# Patient Record
Sex: Male | Born: 1960 | Race: White | Hispanic: No | Marital: Married | Smoking: Former smoker
Health system: Southern US, Community
[De-identification: ages and names within clinical notes are randomized; demographics above are authoritative.]

## PROBLEM LIST (undated history)

## (undated) DIAGNOSIS — K219 Gastro-esophageal reflux disease without esophagitis: Secondary | ICD-10-CM

## (undated) DIAGNOSIS — F419 Anxiety disorder, unspecified: Secondary | ICD-10-CM

## (undated) DIAGNOSIS — N5089 Other specified disorders of the male genital organs: Secondary | ICD-10-CM

## (undated) DIAGNOSIS — R002 Palpitations: Secondary | ICD-10-CM

## (undated) DIAGNOSIS — N400 Enlarged prostate without lower urinary tract symptoms: Secondary | ICD-10-CM

## (undated) DIAGNOSIS — J45909 Unspecified asthma, uncomplicated: Secondary | ICD-10-CM

## (undated) DIAGNOSIS — I10 Essential (primary) hypertension: Secondary | ICD-10-CM

## (undated) DIAGNOSIS — G4700 Insomnia, unspecified: Secondary | ICD-10-CM

## (undated) DIAGNOSIS — K259 Gastric ulcer, unspecified as acute or chronic, without hemorrhage or perforation: Secondary | ICD-10-CM

## (undated) HISTORY — DX: Palpitations: R00.2

## (undated) HISTORY — DX: Anxiety disorder, unspecified: F41.9

## (undated) HISTORY — DX: Unspecified asthma, uncomplicated: J45.909

## (undated) HISTORY — DX: Benign prostatic hyperplasia without lower urinary tract symptoms: N40.0

## (undated) HISTORY — DX: Gastric ulcer, unspecified as acute or chronic, without hemorrhage or perforation: K25.9

## (undated) HISTORY — DX: Essential (primary) hypertension: I10

## (undated) HISTORY — PX: MASTECTOMY: SHX3

## (undated) HISTORY — DX: Other specified disorders of the male genital organs: N50.89

## (undated) HISTORY — DX: Gastro-esophageal reflux disease without esophagitis: K21.9

## (undated) HISTORY — DX: Insomnia, unspecified: G47.00

---

## 1981-12-07 HISTORY — PX: VASECTOMY: SHX75

## 2005-10-26 ENCOUNTER — Ambulatory Visit: Payer: Self-pay | Admitting: Specialist

## 2005-12-09 ENCOUNTER — Emergency Department: Payer: Self-pay | Admitting: Emergency Medicine

## 2006-01-05 ENCOUNTER — Ambulatory Visit: Payer: Self-pay | Admitting: Psychiatry

## 2006-12-07 HISTORY — PX: INGUINAL HERNIA REPAIR: SUR1180

## 2007-01-03 DIAGNOSIS — F419 Anxiety disorder, unspecified: Secondary | ICD-10-CM | POA: Insufficient documentation

## 2007-04-04 DIAGNOSIS — R002 Palpitations: Secondary | ICD-10-CM | POA: Insufficient documentation

## 2007-07-26 DIAGNOSIS — K219 Gastro-esophageal reflux disease without esophagitis: Secondary | ICD-10-CM | POA: Insufficient documentation

## 2007-12-06 DIAGNOSIS — G4700 Insomnia, unspecified: Secondary | ICD-10-CM | POA: Insufficient documentation

## 2007-12-30 ENCOUNTER — Ambulatory Visit: Payer: Self-pay | Admitting: General Surgery

## 2010-05-27 ENCOUNTER — Ambulatory Visit: Payer: Self-pay | Admitting: Family Medicine

## 2010-05-27 DIAGNOSIS — J45901 Unspecified asthma with (acute) exacerbation: Secondary | ICD-10-CM | POA: Insufficient documentation

## 2014-05-25 DIAGNOSIS — N5089 Other specified disorders of the male genital organs: Secondary | ICD-10-CM | POA: Insufficient documentation

## 2014-05-30 ENCOUNTER — Ambulatory Visit: Payer: Self-pay | Admitting: Family Medicine

## 2015-01-25 ENCOUNTER — Ambulatory Visit: Payer: Self-pay | Admitting: Family Medicine

## 2015-06-27 ENCOUNTER — Ambulatory Visit: Payer: Self-pay | Admitting: Urology

## 2015-07-02 ENCOUNTER — Ambulatory Visit: Payer: Self-pay | Admitting: Urology

## 2015-07-17 ENCOUNTER — Encounter: Payer: Self-pay | Admitting: *Deleted

## 2015-07-19 ENCOUNTER — Encounter: Payer: Self-pay | Admitting: Family Medicine

## 2015-07-19 ENCOUNTER — Ambulatory Visit (INDEPENDENT_AMBULATORY_CARE_PROVIDER_SITE_OTHER): Payer: BLUE CROSS/BLUE SHIELD | Admitting: Family Medicine

## 2015-07-19 VITALS — BP 128/72 | HR 58 | Temp 98.2°F | Resp 16 | Ht 66.0 in | Wt 154.0 lb

## 2015-07-19 DIAGNOSIS — B349 Viral infection, unspecified: Secondary | ICD-10-CM | POA: Diagnosis not present

## 2015-07-19 DIAGNOSIS — R0681 Apnea, not elsewhere classified: Secondary | ICD-10-CM | POA: Insufficient documentation

## 2015-07-19 MED ORDER — HYDROCODONE-HOMATROPINE 5-1.5 MG/5ML PO SYRP: ORAL_SOLUTION | ORAL | Status: DC

## 2015-07-19 MED ORDER — DOXYCYCLINE HYCLATE 100 MG PO TABS: 100.0000 mg | ORAL_TABLET | Freq: Two times a day (BID) | ORAL | Status: DC

## 2015-07-19 NOTE — Progress Notes (Signed)
Subjective:     Patient ID: Benjamin Ford, male   DOB: 1961-04-09, 54 y.o.   MRN: [id number]  HPI  Chief Complaint  Patient presents with  . Cough    Patient comes in office today with concerns of cough and congestion for the past 4 days. Patient states he had a fever high of 101.2, associated symptoms include: runny nose, ear ache and sinus pain/pressure. Patient states that he has taken otc Tylenol and Mucinex  Also reports mild body aches and headache. Reports possibility of tick exposure though has not removed any ticks.   Review of Systems  Respiratory:       Using albuterol MDI for chest tightness once daily.       Objective:   Physical Exam  Constitutional: He appears well-developed and well-nourished. No distress.  Ears: T.M's intact without inflammation Throat: no tonsillar enlargement or exudate Neck: no cervical adenopathy Lungs: clear     Assessment:    1. Viral syndrome - HYDROcodone-homatropine (HYCODAN) 5-1.5 MG/5ML syrup; 5 ml 4-6 hours as needed for cough  Dispense: 240 mL; Refill: 0 - doxycycline (VIBRA-TABS) 100 MG tablet; Take 1 tablet (100 mg total) by mouth 2 (two) times daily.  Dispense: 20 tablet; Refill: 0    Plan:    Continue to treat symptomatically. If fever, body aches and fever do not resolve over the next 2-3 days to start antibiotic.

## 2015-07-19 NOTE — Patient Instructions (Signed)
Start antibiotic if fever not abating; symptoms improving over the next 2-3 days. Add Mucinex D.

## 2015-07-23 ENCOUNTER — Encounter: Payer: Self-pay | Admitting: Urology

## 2015-07-23 ENCOUNTER — Ambulatory Visit (INDEPENDENT_AMBULATORY_CARE_PROVIDER_SITE_OTHER): Payer: BLUE CROSS/BLUE SHIELD | Admitting: Urology

## 2015-07-23 VITALS — Resp 18 | Ht 66.75 in | Wt 153.3 lb

## 2015-07-23 DIAGNOSIS — N401 Enlarged prostate with lower urinary tract symptoms: Secondary | ICD-10-CM | POA: Diagnosis not present

## 2015-07-23 DIAGNOSIS — Z8042 Family history of malignant neoplasm of prostate: Secondary | ICD-10-CM | POA: Diagnosis not present

## 2015-07-23 DIAGNOSIS — N138 Other obstructive and reflux uropathy: Secondary | ICD-10-CM | POA: Insufficient documentation

## 2015-07-23 LAB — BLADDER SCAN AMB NON-IMAGING

## 2015-07-23 NOTE — Progress Notes (Signed)
07/23/2015 2:34 PM   Benjamin Ford 1961-06-26 [id number]  Referring provider: No referring provider defined for this encounter.  Chief Complaint  Patient presents with  . Follow-up    6 month  . Benign Prostatic Hypertrophy  . Spermatocele    HPI: Mr. Kunath is a 54 year old white male with a strong family history of PCa, with his brother and father both being diagnosed with PCa, and BPH with LUTS who presents today for a 6 month follow up.    His father was diagnosed at the age of 21 with PCa and his brother at the age of 60.  His brother recently underwent a prostatectomy.    His IPSS score today is 4 , which is mild lower urinary tract symptomatology. He is pleased with his quality life due to his urinary symptoms. His PVR is 20 mL.  His previous IPSS score was 1/0.    His major complaint today urinary frequency, but he contributes that to drinking large amounts of water and green tea..  He has had these symptoms for 2 years.  He denies any dysuria, hematuria or suprapubic pain.   Previous PSA's:     0.6 ng/mL on 06/26/2014     0.7 ng/mL on 12/27/2014  He also denies any recent chills, nausea or vomiting.  He has been experiencing a low-grade fever for the last several months. His primary care provider believes this may be a symptom of tick borne fever. He is currently on a regimen of doxycycline for this condition.      IPSS      07/23/15 1400       International Prostate Symptom Score   How often have you had the sensation of not emptying your bladder? Not at All     How often have you had to urinate less than every two hours? About half the time     How often have you found you stopped and started again several times when you urinated? Not at All     How often have you found it difficult to postpone urination? Not at All     How often have you had a weak urinary stream? Not at All     How often have you had to strain to start urination? Not at All     How many  times did you typically get up at night to urinate? 1 Time     Total IPSS Score 4     Quality of Life due to urinary symptoms   If you were to spend the rest of your life with your urinary condition just the way it is now how would you feel about that? Pleased        Score:  1-7 Mild 8-19 Moderate 20-35 Severe     PMH: Past Medical History  Diagnosis Date  . Testicular mass   . Asthma   . Insomnia   . GERD (gastroesophageal reflux disease)   . Palpitations   . Anxiety   . Stomach ulcer   . BPH (benign prostatic hyperplasia)   . HTN (hypertension)     Surgical History: Past Surgical History  Procedure Laterality Date  . Mastectomy Bilateral 1992/1981  . Vasectomy  1983  . Inguinal hernia repair  2008    Home Medications:    Medication List       This list is accurate as of: 07/23/15  2:34 PM.  Always use your most recent med list.  doxycycline 100 MG tablet  Commonly known as:  VIBRA-TABS  Take 1 tablet (100 mg total) by mouth 2 (two) times daily.     guaiFENesin 600 MG 12 hr tablet  Commonly known as:  MUCINEX  Take 600 mg by mouth 2 (two) times daily.     HYDROcodone-homatropine 5-1.5 MG/5ML syrup  Commonly known as:  HYCODAN  5 ml 4-6 hours as needed for cough     PROAIR HFA 108 (90 BASE) MCG/ACT inhaler  Generic drug:  albuterol  Inhale into the lungs.        Allergies: No Known Allergies  Family History: Family History  Problem Relation Age of Onset  . Prostate cancer Father   . Cancer Father     bone, prostate  . Prostate cancer Brother   . Kidney disease Neg Hx   . Bladder Cancer Neg Hx   . COPD Mother   . Cancer Mother     lung  . Cancer Maternal Grandmother     brain  . Aneurysm Paternal Grandmother     Social History:  reports that he has quit smoking. He does not have any smokeless tobacco history on file. He reports that he drinks alcohol. His drug history is not on file.  ROS: UROLOGY Frequent  Urination?: No Hard to postpone urination?: No Burning/pain with urination?: No Get up at night to urinate?: Yes Leakage of urine?: No Urine stream starts and stops?: No Trouble starting stream?: No Do you have to strain to urinate?: No Blood in urine?: No Urinary tract infection?: No Sexually transmitted disease?: No Injury to kidneys or bladder?: No Painful intercourse?: No Weak stream?: No Erection problems?: No Penile pain?: No  Gastrointestinal Nausea?: No Vomiting?: No Indigestion/heartburn?: No Diarrhea?: No Constipation?: No  Constitutional Fever: Yes Night sweats?: No Weight loss?: No Fatigue?: No  Skin Skin rash/lesions?: No Itching?: No  Eyes Blurred vision?: No Double vision?: No  Ears/Nose/Throat Sore throat?: No Sinus problems?: Yes  Hematologic/Lymphatic Swollen glands?: No Easy bruising?: No  Cardiovascular Leg swelling?: No Chest pain?: No  Respiratory Cough?: Yes Shortness of breath?: No  Endocrine Excessive thirst?: No  Musculoskeletal Back pain?: No Joint pain?: Yes  Neurological Headaches?: No Dizziness?: No  Psychologic Depression?: No Anxiety?: No  Physical Exam: Resp 18  Ht 5' 6.75" (1.695 m)  Wt 153 lb 4.8 oz (69.536 kg)  BMI 24.20 kg/m2  GU: Patient with circumcised phallus.   Urethral meatus is patent.  No penile discharge. No penile lesions or rashes. Scrotum without lesions, cysts, rashes and/or edema.  Testicles are located scrotally bilaterally. No masses are appreciated in the testicles. Left and right epididymis are normal. Right spermatocele ( 3 cm x 3cm) is noted. Rectal: Patient with  normal sphincter tone. Perineum without scarring or rashes. No rectal masses are appreciated. Prostate is approximately 45 grams, no nodules are appreciated. Seminal vesicles are normal.  Laboratory Data: No results found for: WBC, HGB, HCT, MCV, PLT  No results found for: CREATININE  No results found for: PSA  No  results found for: TESTOSTERONE  No results found for: HGBA1C  Urinalysis No results found for: COLORURINE, APPEARANCEUR, LABSPEC, PHURINE, GLUCOSEU, HGBUR, BILIRUBINUR, KETONESUR, PROTEINUR, UROBILINOGEN, NITRITE, LEUKOCYTESUR  Pertinent Imaging: Results for orders placed or performed in visit on 07/23/15  BLADDER SCAN AMB NON-IMAGING  Result Value Ref Range   Scan Result 20ml     Assessment & Plan  1. BPH with LUTS:   Patient's IPSS score is 4/1.  His  PVR 20 mL.  His DRE demonstrates mild enlargement. We will continue observation.   He will follow up in 6 months for a PSA, DRE, PVR and an IPSS.  PSA to be drawn before appointment  - PSA - BLADDER SCAN AMB NON-IMAGING  2. Family history of prostate cancer:   Patient's father and brother were both diagnosed with prostate cancer in their 1s.  We will continue to monitor every 6 months with PSA and DRE.   No Follow-up on file.  Piera Downs, PA-C

## 2015-07-24 ENCOUNTER — Telehealth: Payer: Self-pay

## 2015-07-24 LAB — PSA: PROSTATE SPECIFIC AG, SERUM: 0.7 ng/mL (ref 0.0–4.0)

## 2015-07-24 NOTE — Telephone Encounter (Signed)
Spoke with pt in reference to PSA results. Pt voiced understanding.  

## 2015-07-24 NOTE — Telephone Encounter (Signed)
-----   Message from Harle Battiest, PA-C sent at 07/24/2015  8:08 AM EDT ----- PSA is stable we will see him in 6 months.

## 2016-01-15 ENCOUNTER — Other Ambulatory Visit: Payer: Self-pay

## 2016-01-15 DIAGNOSIS — R972 Elevated prostate specific antigen [PSA]: Secondary | ICD-10-CM

## 2016-01-16 ENCOUNTER — Other Ambulatory Visit: Payer: BLUE CROSS/BLUE SHIELD

## 2016-01-16 DIAGNOSIS — R972 Elevated prostate specific antigen [PSA]: Secondary | ICD-10-CM

## 2016-01-17 LAB — PSA: PROSTATE SPECIFIC AG, SERUM: 0.7 ng/mL (ref 0.0–4.0)

## 2016-01-23 ENCOUNTER — Ambulatory Visit (INDEPENDENT_AMBULATORY_CARE_PROVIDER_SITE_OTHER): Payer: BLUE CROSS/BLUE SHIELD | Admitting: Urology

## 2016-01-23 ENCOUNTER — Encounter: Payer: Self-pay | Admitting: Urology

## 2016-01-23 VITALS — BP 160/68 | HR 52 | Ht 67.0 in | Wt 156.9 lb

## 2016-01-23 DIAGNOSIS — Z8042 Family history of malignant neoplasm of prostate: Secondary | ICD-10-CM

## 2016-01-23 DIAGNOSIS — N401 Enlarged prostate with lower urinary tract symptoms: Secondary | ICD-10-CM | POA: Diagnosis not present

## 2016-01-23 DIAGNOSIS — N138 Other obstructive and reflux uropathy: Secondary | ICD-10-CM

## 2016-01-23 NOTE — Progress Notes (Signed)
3:20 PM   Benjamin Ford 08/03/1961 [id number]  Referring provider: Malva Limes, MD [address] [place], [place] [address]  Chief Complaint  Patient presents with  . Benign Prostatic Hypertrophy    follow up 6 month     HPI: Benjamin Ford is a 55 year old white male with a strong family history of PCa, with his brother and father both being diagnosed with PCa, and BPH with LUTS who presents today for a 6 month follow up.    His father was diagnosed at the age of 74 with PCa and his brother at the age of 96.  His brother recently underwent a prostatectomy.    His IPSS score today is 1 , which is mild lower urinary tract symptomatology. He is delighted with his quality life due to his urinary symptoms. His PVR is 20 mL.  His previous IPSS score was 4/1.    His major complaint today is nocturia x 1, but this is intermittent and not bothersome.   He has had these symptoms for 2 years.  He denies any dysuria, hematuria or suprapubic pain.   He also denies any recent chills, nausea or vomiting.  He has been experiencing a low-grade fever for the last several months. His primary care provider believes this may be a symptom of tick borne fever. He is currently on a regimen of doxycycline for this condition.      IPSS      01/23/16 1400       International Prostate Symptom Score   How often have you had the sensation of not emptying your bladder? Not at All     How often have you had to urinate less than every two hours? Not at All     How often have you found you stopped and started again several times when you urinated? Not at All     How often have you found it difficult to postpone urination? Not at All     How often have you had a weak urinary stream? Not at All     How often have you had to strain to start urination? Not at All     How many times did you typically get up at night to urinate? 1 Time     Total IPSS Score 1     Quality of Life due to urinary  symptoms   If you were to spend the rest of your life with your urinary condition just the way it is now how would you feel about that? Delighted        Score:  1-7 Mild 8-19 Moderate 20-35 Severe    PMH: Past Medical History  Diagnosis Date  . Testicular mass   . Asthma   . Insomnia   . GERD (gastroesophageal reflux disease)   . Palpitations   . Anxiety   . Stomach ulcer   . BPH (benign prostatic hyperplasia)   . HTN (hypertension)     Surgical History: Past Surgical History  Procedure Laterality Date  . Mastectomy Bilateral 1992/1981  . Vasectomy  1983  . Inguinal hernia repair  2008    Home Medications:    Medication List       This list is accurate as of: 01/23/16  3:20 PM.  Always use your most recent med list.               doxycycline 100 MG tablet  Commonly known as:  VIBRA-TABS  Take 1 tablet (100 mg total) by mouth 2 (two) times daily.     guaiFENesin 600 MG 12 hr tablet  Commonly known as:  MUCINEX  Take 600 mg by mouth 2 (two) times daily.     HYDROcodone-homatropine 5-1.5 MG/5ML syrup  Commonly known as:  HYCODAN  5 ml 4-6 hours as needed for cough     PROAIR HFA 108 (90 Base) MCG/ACT inhaler  Generic drug:  albuterol  Inhale into the lungs.        Allergies: No Known Allergies  Family History: Family History  Problem Relation Age of Onset  . Prostate cancer Father   . Cancer Father     bone, prostate  . Prostate cancer Brother   . Kidney disease Neg Hx   . Bladder Cancer Neg Hx   . COPD Mother   . Cancer Mother     lung  . Cancer Maternal Grandmother     brain  . Aneurysm Paternal Grandmother     Social History:  reports that he has quit smoking. He does not have any smokeless tobacco history on file. He reports that he drinks alcohol. He reports that he does not use illicit drugs.  ROS: UROLOGY Frequent Urination?: No Hard to postpone urination?: No Burning/pain with urination?: No Get up at night to urinate?:  Yes Leakage of urine?: No Urine stream starts and stops?: No Trouble starting stream?: No Do you have to strain to urinate?: No Blood in urine?: No Urinary tract infection?: No Sexually transmitted disease?: No Injury to kidneys or bladder?: No Painful intercourse?: No Weak stream?: No Erection problems?: No Penile pain?: No  Gastrointestinal Nausea?: No Vomiting?: No Indigestion/heartburn?: No Diarrhea?: No Constipation?: No  Constitutional Fever: No Night sweats?: No Weight loss?: No Fatigue?: No  Skin Skin rash/lesions?: No Itching?: No  Eyes Blurred vision?: No Double vision?: No  Ears/Nose/Throat Sore throat?: No Sinus problems?: Yes  Hematologic/Lymphatic Swollen glands?: No Easy bruising?: No  Cardiovascular Leg swelling?: No Chest pain?: No  Respiratory Cough?: No Shortness of breath?: No  Endocrine Excessive thirst?: No  Musculoskeletal Back pain?: No Joint pain?: No  Neurological Headaches?: No Dizziness?: No  Psychologic Depression?: No Anxiety?: No  Physical Exam: BP 160/68 mmHg  Pulse 52  Ht 5\' 7"  (1.702 m)  Wt 156 lb 14.4 oz (71.169 kg)  BMI 24.57 kg/m2  GU: Patient with circumcised phallus.   Urethral meatus is patent.  No penile discharge. No penile lesions or rashes. Scrotum without lesions, cysts, rashes and/or edema.  Testicles are located scrotally bilaterally. No masses are appreciated in the testicles. Left and right epididymis are normal. Right spermatocele ( 3 cm x 3cm) is noted. Rectal: Patient with  normal sphincter tone. Perineum without scarring or rashes. No rectal masses are appreciated. Prostate is approximately 45 grams, no nodules are appreciated. Seminal vesicles are normal.  Laboratory Data: Previous PSA's:     0.6 ng/mL on 06/26/2014     0.7 ng/mL on 12/27/2014  Results for orders placed or performed in visit on 01/16/16  PSA  Result Value Ref Range   Prostate Specific Ag, Serum 0.7 0.0 - 4.0 ng/mL     Assessment & Plan  1. BPH with LUTS:   Patient's IPSS score is 1/0.   We will continue observation.   He will follow up in 6 months for a PSA, exam and an IPSS score.    2. Family history of prostate cancer:   Patient's father and brother were both  diagnosed with prostate cancer in their 27's.  We will continue to monitor every 6 months with PSA and DRE.   Return in about 6 months (around 07/22/2016) for IPSS score and exam.  Lashell Moffitt, PA-C

## 2016-06-30 ENCOUNTER — Telehealth: Payer: Self-pay

## 2016-06-30 NOTE — Telephone Encounter (Signed)
He should take 50mg  benadryl if he is not going to be driving anywhere this afternoon. He need to apply ice to area of bee sting to reduce spread of venom and inflammation. Go to Er if he breaks out in generalized rash, or has any wheezing or difficulty breathing.

## 2016-06-30 NOTE — Telephone Encounter (Signed)
Advised patient as below.  

## 2016-06-30 NOTE — Telephone Encounter (Signed)
Patient reports that he was stung by a bee a little over 1 hour ago. Patient reports that he was stung on his right leg just above it knee. Patient denies any shortness of breath, wheezing, or stridor. Patient reports that he is starting to have swelling, redness, itching, and a burning sensation in the area where he was stung. Patient wanted to know should he be seen? Or can he just take Benadryl for the symptoms? Please advise. Contact info is correct. Thanks!

## 2016-07-16 ENCOUNTER — Other Ambulatory Visit: Payer: BLUE CROSS/BLUE SHIELD

## 2016-07-17 ENCOUNTER — Other Ambulatory Visit: Payer: Self-pay

## 2016-07-17 DIAGNOSIS — N400 Enlarged prostate without lower urinary tract symptoms: Secondary | ICD-10-CM

## 2016-07-20 ENCOUNTER — Other Ambulatory Visit: Payer: BLUE CROSS/BLUE SHIELD

## 2016-07-20 DIAGNOSIS — N400 Enlarged prostate without lower urinary tract symptoms: Secondary | ICD-10-CM

## 2016-07-21 LAB — PSA: Prostate Specific Ag, Serum: 0.8 ng/mL (ref 0.0–4.0)

## 2016-07-23 ENCOUNTER — Ambulatory Visit (INDEPENDENT_AMBULATORY_CARE_PROVIDER_SITE_OTHER): Payer: BLUE CROSS/BLUE SHIELD | Admitting: Urology

## 2016-07-23 ENCOUNTER — Encounter: Payer: Self-pay | Admitting: Urology

## 2016-07-23 VITALS — BP 150/79 | HR 67 | Ht 66.5 in | Wt 153.4 lb

## 2016-07-23 DIAGNOSIS — N138 Other obstructive and reflux uropathy: Secondary | ICD-10-CM

## 2016-07-23 DIAGNOSIS — Z8042 Family history of malignant neoplasm of prostate: Secondary | ICD-10-CM | POA: Diagnosis not present

## 2016-07-23 DIAGNOSIS — N401 Enlarged prostate with lower urinary tract symptoms: Secondary | ICD-10-CM | POA: Diagnosis not present

## 2016-07-23 NOTE — Progress Notes (Signed)
2:01 PM   Benjamin Ford [birth year] [id number]  Referring provider: Malva Limesonald E Fisher, MD [address] [place], [place] [id number]  Chief Complaint  Patient presents with  . Follow-up    BPH    HPI: [name] is a 55 year old Caucasian male with a strong family history of PCa, with his brother and father both being diagnosed with PCa, and BPH with LUTS who presents today for a 6 month follow up.    His father was diagnosed at the age of 56 with PCa and his brother at the age of 56.  His brother recently underwent a prostatectomy.  His other brother has been followed for a rising PSA.    His IPSS score today is 2 , which is mild lower urinary tract symptomatology. He is delighted with his quality life due to his urinary symptoms. His previous PVR is 20 mL.  His previous IPSS score was 1/0.    His major complaint today is nocturia x 1, but this is intermittent and not bothersome.   He has had these symptoms for 3 years.  He denies any dysuria, hematuria or suprapubic pain.   He also denies any recent chills, nausea or vomiting.         IPSS    Row Name 07/23/16 1300         International Prostate Symptom Score   How often have you had the sensation of not emptying your bladder? Not at All     How often have you had to urinate less than every two hours? Less than 1 in 5 times     How often have you found you stopped and started again several times when you urinated? Not at All     How often have you found it difficult to postpone urination? Not at All     How often have you had a weak urinary stream? Not at All     How often have you had to strain to start urination? Not at All     How many times did you typically get up at night to urinate? 1 Time     Total IPSS Score 2       Quality of Life due to urinary symptoms   If you were to spend the rest of your life with your urinary condition just the way it is now how would you feel about that? Delighted         Score:  1-7 Mild 8-19 Moderate 20-35 Severe    PMH: Past Medical History:  Diagnosis Date  . Anxiety   . Asthma   . BPH (benign prostatic hyperplasia)   . GERD (gastroesophageal reflux disease)   . HTN (hypertension)   . Insomnia   . Palpitations   . Stomach ulcer   . Testicular mass     Surgical History: Past Surgical History:  Procedure Laterality Date  . INGUINAL HERNIA REPAIR  2008  . MASTECTOMY Bilateral 1992/1981  . VASECTOMY  1983    Home Medications:    Medication List       Accurate as of 07/23/16  2:01 PM. Always use your most recent med list.          guaiFENesin 600 MG 12 hr tablet Commonly known as:  MUCINEX Take 600 mg by mouth 2 (two) times daily.   PROAIR HFA 108 (90 Base) MCG/ACT inhaler Generic drug:  albuterol Inhale into the lungs.  Allergies: No Known Allergies  Family History: Family History  Problem Relation Age of Onset  . Prostate cancer Father   . Cancer Father     bone, prostate  . Prostate cancer Brother   . Kidney disease Neg Hx   . Bladder Cancer Neg Hx   . COPD Mother   . Cancer Mother     lung  . Cancer Maternal Grandmother     brain  . Aneurysm Paternal Grandmother     Social History:  reports that he has quit smoking. He does not have any smokeless tobacco history on file. He reports that he drinks alcohol. He reports that he does not use drugs.  ROS: UROLOGY Frequent Urination?: No Hard to postpone urination?: No Burning/pain with urination?: No Get up at night to urinate?: No Leakage of urine?: No Urine stream starts and stops?: No Trouble starting stream?: No Do you have to strain to urinate?: No Blood in urine?: No Urinary tract infection?: No Sexually transmitted disease?: No Injury to kidneys or bladder?: No Painful intercourse?: No Weak stream?: No Erection problems?: No Penile pain?: No  Gastrointestinal Nausea?: No Vomiting?: No Indigestion/heartburn?: No Diarrhea?:  No Constipation?: No  Constitutional Fever: No Night sweats?: No Weight loss?: No Fatigue?: No  Skin Skin rash/lesions?: No Itching?: No  Eyes Blurred vision?: No Double vision?: No  Ears/Nose/Throat Sore throat?: No Sinus problems?: No  Hematologic/Lymphatic Swollen glands?: No Easy bruising?: No  Cardiovascular Leg swelling?: No Chest pain?: No  Respiratory Cough?: No Shortness of breath?: No  Endocrine Excessive thirst?: No  Musculoskeletal Back pain?: No Joint pain?: No  Neurological Headaches?: No Dizziness?: No  Psychologic Depression?: No Anxiety?: No  Physical Exam: BP (!) 150/79   Pulse 67   Ht 5' 6.5" (1.689 m)   Wt 153 lb 6.4 oz (69.6 kg)   BMI 24.39 kg/m   GU: Patient with circumcised phallus.   Urethral meatus is patent.  No penile discharge. No penile lesions or rashes. Scrotum without lesions, cysts, rashes and/or edema.  Testicles are located scrotally bilaterally. No masses are appreciated in the testicles. Left and right epididymis are normal. Right spermatocele ( 3 cm x 3cm) is noted. Rectal: Patient with  normal sphincter tone. Perineum without scarring or rashes. No rectal masses are appreciated. Prostate is approximately 45 grams, no nodules are appreciated. Seminal vesicles are normal.  Laboratory Data: Previous PSA's:     0.6 ng/mL on 06/26/2014     0.7 ng/mL on 12/27/2014     0.8 ng/mL on 07/20/2016  Results for orders placed or performed in visit on 01/16/16  PSA  Result Value Ref Range   Prostate Specific Ag, Serum 0.7 0.0 - 4.0 ng/mL    Assessment & Plan  1. BPH with LUTS  - IPSS score is 2/0, it is stable  - Continue conservative management, avoiding bladder irritants and timed voiding's  - RTC in 6 months for IPSS, PSA and exam   2. Family history of prostate cancer:   Patient's father and brother were both diagnosed with prostate cancer in their 6250's.  We will continue to monitor every 6 months with PSA and  DRE.   Return in about 6 months (around 01/23/2017) for IPSS, PSA and exam.  Maksim Peregoy, PA-C

## 2017-01-22 ENCOUNTER — Other Ambulatory Visit: Payer: Self-pay

## 2017-01-22 DIAGNOSIS — N401 Enlarged prostate with lower urinary tract symptoms: Secondary | ICD-10-CM

## 2017-01-25 ENCOUNTER — Other Ambulatory Visit: Payer: BLUE CROSS/BLUE SHIELD

## 2017-01-25 DIAGNOSIS — N401 Enlarged prostate with lower urinary tract symptoms: Secondary | ICD-10-CM

## 2017-01-26 ENCOUNTER — Other Ambulatory Visit: Payer: BLUE CROSS/BLUE SHIELD

## 2017-01-26 LAB — PSA: Prostate Specific Ag, Serum: 0.8 ng/mL (ref 0.0–4.0)

## 2017-01-28 ENCOUNTER — Ambulatory Visit (INDEPENDENT_AMBULATORY_CARE_PROVIDER_SITE_OTHER): Payer: Self-pay | Admitting: Urology

## 2017-01-28 ENCOUNTER — Encounter: Payer: Self-pay | Admitting: Urology

## 2017-01-28 VITALS — BP 148/75 | HR 56 | Ht 67.0 in | Wt 158.8 lb

## 2017-01-28 DIAGNOSIS — N138 Other obstructive and reflux uropathy: Secondary | ICD-10-CM

## 2017-01-28 DIAGNOSIS — Z8042 Family history of malignant neoplasm of prostate: Secondary | ICD-10-CM

## 2017-01-28 DIAGNOSIS — N401 Enlarged prostate with lower urinary tract symptoms: Secondary | ICD-10-CM

## 2017-01-28 NOTE — Progress Notes (Signed)
2:13 PM   Mila Homerric Lyle Hipple 01-24-1961 [id number]  Referring provider: Malva Limesonald E Fisher, MD [address] [place], [place] [id number]  Chief Complaint  Patient presents with  . Benign Prostatic Hypertrophy    6 month follow up     HPI: [name] is a 56 year old Caucasian male with a strong family history of PCa, with his brother and father both being diagnosed with PCa, and BPH with LUTS who presents today for a 6 month follow up.    His father was diagnosed at the age of 57 with PCa and his brother at the age of 57.  His brother recently underwent a prostatectomy.  His other brother has been followed for a rising PSA.    His IPSS score today is 2 , which is mild lower urinary tract symptomatology. He is delighted with his quality life due to his urinary symptoms.  His previous PVR is 20 mL.  His previous IPSS score was 2/0.    His major complaint today is nocturia x 1, but this is intermittent and not bothersome.   He has had these symptoms for 3 years.  He denies any dysuria, hematuria or suprapubic pain.   He also denies any recent chills, nausea or vomiting.         IPSS    Row Name 01/28/17 1300         International Prostate Symptom Score   How often have you had the sensation of not emptying your bladder? Not at All     How often have you had to urinate less than every two hours? Less than 1 in 5 times     How often have you found you stopped and started again several times when you urinated? Not at All     How often have you found it difficult to postpone urination? Not at All     How often have you had a weak urinary stream? Not at All     How often have you had to strain to start urination? Not at All     How many times did you typically get up at night to urinate? 1 Time     Total IPSS Score 2       Quality of Life due to urinary symptoms   If you were to spend the rest of your life with your urinary condition just the way it is now how would you  feel about that? Delighted        Score:  1-7 Mild 8-19 Moderate 20-35 Severe    PMH: Past Medical History:  Diagnosis Date  . Anxiety   . Asthma   . BPH (benign prostatic hyperplasia)   . GERD (gastroesophageal reflux disease)   . HTN (hypertension)   . Insomnia   . Palpitations   . Stomach ulcer   . Testicular mass     Surgical History: Past Surgical History:  Procedure Laterality Date  . INGUINAL HERNIA REPAIR  2008  . MASTECTOMY Bilateral 1992/1981  . VASECTOMY  1983    Home Medications:  Allergies as of 01/28/2017   No Known Allergies     Medication List       Accurate as of 01/28/17  2:13 PM. Always use your most recent med list.          guaiFENesin 600 MG 12 hr tablet Commonly known as:  MUCINEX Take 600 mg by mouth 2 (two) times daily.   PROAIR  HFA 108 (90 Base) MCG/ACT inhaler Generic drug:  albuterol Inhale into the lungs.       Allergies: No Known Allergies  Family History: Family History  Problem Relation Age of Onset  . Prostate cancer Father   . Cancer Father     bone, prostate  . COPD Mother   . Cancer Mother     lung  . Cancer Maternal Grandmother     brain  . Aneurysm Paternal Grandmother   . Prostate cancer Brother   . Kidney disease Neg Hx   . Bladder Cancer Neg Hx     Social History:  reports that he has quit smoking. He has never used smokeless tobacco. He reports that he drinks alcohol. He reports that he does not use drugs.  ROS: UROLOGY Frequent Urination?: No Hard to postpone urination?: No Burning/pain with urination?: No Get up at night to urinate?: No Leakage of urine?: No Urine stream starts and stops?: No Trouble starting stream?: No Do you have to strain to urinate?: No Blood in urine?: No Urinary tract infection?: No Sexually transmitted disease?: No Injury to kidneys or bladder?: No Painful intercourse?: No Weak stream?: No Erection problems?: No Penile pain?:  No  Gastrointestinal Nausea?: No Vomiting?: No Indigestion/heartburn?: No Diarrhea?: No Constipation?: No  Constitutional Fever: No Night sweats?: No Weight loss?: No Fatigue?: No  Skin Skin rash/lesions?: No Itching?: No  Eyes Blurred vision?: No Double vision?: No  Ears/Nose/Throat Sore throat?: No Sinus problems?: No  Hematologic/Lymphatic Swollen glands?: No Easy bruising?: No  Cardiovascular Leg swelling?: No Chest pain?: No  Respiratory Cough?: No Shortness of breath?: No  Endocrine Excessive thirst?: No  Musculoskeletal Back pain?: No Joint pain?: No  Neurological Headaches?: No Dizziness?: No  Psychologic Depression?: No Anxiety?: No  Physical Exam: BP (!) 148/75   Pulse (!) 56   Ht 5\' 7"  (1.702 m)   Wt 158 lb 12.8 oz (72 kg)   BMI 24.87 kg/m   GU: Patient with circumcised phallus.   Urethral meatus is patent.  No penile discharge. No penile lesions or rashes. Scrotum without lesions, cysts, rashes and/or edema.  Testicles are located scrotally bilaterally. No masses are appreciated in the testicles. Left and right epididymis are normal. Right spermatocele ( 3 cm x 3cm) is noted. Rectal: Patient with  normal sphincter tone. Perineum without scarring or rashes. No rectal masses are appreciated. Prostate is approximately 45 grams, no nodules are appreciated. Seminal vesicles are normal.  Laboratory Data: Previous PSA's:     0.6 ng/mL on 06/26/2014     0.7 ng/mL on 12/27/2014     0.8 ng/mL on 07/20/2016  Results for orders placed or performed in visit on 01/16/16  PSA  Result Value Ref Range   Prostate Specific Ag, Serum 0.7 0.0 - 4.0 ng/mL   PSA  0.8 ng/mL on 01/25/2017   Assessment & Plan  1. BPH with LUTS  - IPSS score is 2/0, it is stable  - Continue conservative management, avoiding bladder irritants and timed voiding's  - RTC in 6 months for IPSS, PSA and exam   2. Family history of prostate cancer:   Patient's father and  brother were both diagnosed with prostate cancer in their 28's.  We will continue to monitor every 6 months with PSA and DRE.   Return in about 6 months (around 07/28/2017) for IPSS, PSA and exam.  Dejion Grillo, PA-C

## 2017-05-24 ENCOUNTER — Encounter: Payer: Self-pay | Admitting: Family Medicine

## 2017-05-24 ENCOUNTER — Ambulatory Visit (INDEPENDENT_AMBULATORY_CARE_PROVIDER_SITE_OTHER): Payer: BLUE CROSS/BLUE SHIELD | Admitting: Family Medicine

## 2017-05-24 VITALS — BP 134/90 | HR 66 | Temp 98.4°F | Resp 16 | Wt 158.0 lb

## 2017-05-24 DIAGNOSIS — S022XXD Fracture of nasal bones, subsequent encounter for fracture with routine healing: Secondary | ICD-10-CM

## 2017-05-24 DIAGNOSIS — S0121XA Laceration without foreign body of nose, initial encounter: Secondary | ICD-10-CM | POA: Diagnosis not present

## 2017-05-24 NOTE — Progress Notes (Signed)
       Patient: Benjamin Ford Male    DOB: 1961/10/13   56 y.o.   MRN: [id number] Visit Date: 05/24/2017  Today's Provider: Mila Merryonald Bassam Dresch, MD   Chief Complaint  Patient presents with  . Suture / Staple Removal   Subjective:    [place] where he tripped causing him to fall on rock pathway causing laceration and fracture of bridge of his nose. He had 3 sutures placed. There is no mention of displacement of fracture in medical records. He states he has broken his nose multiple times in the past and actually breathes better now than he did before recent injury. He feels well now   Suture / Staple Removal        No Known Allergies   Current Outpatient Prescriptions:  .  albuterol (PROAIR HFA) 108 (90 BASE) MCG/ACT inhaler, Inhale into the lungs., Disp: , Rfl:   Review of Systems  Constitutional: Negative for appetite change, chills and fever.  Respiratory: Negative for chest tightness, shortness of breath and wheezing.   Cardiovascular: Negative for chest pain and palpitations.  Gastrointestinal: Negative for abdominal pain, nausea and vomiting.    Social History  Substance Use Topics  . Smoking status: Former Games developermoker  . Smokeless tobacco: Never Used     Comment: quit 4 yrs  . Alcohol use 0.0 oz/week     Comment: moderate   Objective:   BP 134/90 (BP Location: Right Arm, Patient Position: Sitting, Cuff Size: Normal)   Pulse 66   Temp 98.4 F (36.9 C) (Oral)   Resp 16   Wt 158 lb (71.7 kg)   SpO2 98%   BMI 24.75 kg/m  Vitals:   05/24/17 1650  BP: 134/90  Pulse: 66  Resp: 16  Temp: 98.4 F (36.9 C)  TempSrc: Oral  SpO2: 98%  Weight: 158 lb (71.7 kg)     Physical Exam  General appearance: alert, well developed, well nourished, cooperative and in no distress Head: three sutures in place across about 1.5 cm horizontal well healed laceration of bridge of nose. No swelling, no erythema, no drainage. Nasal  passages clear, septum deviated right. No tenderness.      Assessment & Plan:     1. Laceration of nose, initial encounter Removed three sutures without difficulty. Healing well with no signs of infection  2. Closed fracture of nasal bone with routine healing, subsequent encounter He has had several fractures before and feels this is healing well with no difficulty breathing through nose and no unusual pain or visual disfigurement.        Benjamin Merryonald Brandan Robicheaux, MD  [facility]

## 2017-07-26 ENCOUNTER — Other Ambulatory Visit: Payer: Self-pay

## 2017-07-29 ENCOUNTER — Ambulatory Visit: Payer: Self-pay | Admitting: Urology

## 2017-07-29 ENCOUNTER — Ambulatory Visit
Admission: RE | Admit: 2017-07-29 | Discharge: 2017-07-29 | Disposition: A | Discharge status: Home / Self Care | Payer: BLUE CROSS/BLUE SHIELD | Source: Ambulatory Visit | Attending: Physician Assistant | Admitting: Physician Assistant

## 2017-07-29 ENCOUNTER — Telehealth: Payer: Self-pay

## 2017-07-29 ENCOUNTER — Other Ambulatory Visit: Payer: Self-pay

## 2017-07-29 ENCOUNTER — Ambulatory Visit (INDEPENDENT_AMBULATORY_CARE_PROVIDER_SITE_OTHER): Payer: BLUE CROSS/BLUE SHIELD | Admitting: Physician Assistant

## 2017-07-29 ENCOUNTER — Other Ambulatory Visit: Payer: BLUE CROSS/BLUE SHIELD

## 2017-07-29 ENCOUNTER — Encounter: Payer: Self-pay | Admitting: Physician Assistant

## 2017-07-29 VITALS — BP 138/72 | HR 81 | Temp 98.2°F | Wt 158.0 lb

## 2017-07-29 DIAGNOSIS — M940 Chondrocostal junction syndrome [Tietze]: Secondary | ICD-10-CM | POA: Insufficient documentation

## 2017-07-29 DIAGNOSIS — R0781 Pleurodynia: Secondary | ICD-10-CM

## 2017-07-29 DIAGNOSIS — N400 Enlarged prostate without lower urinary tract symptoms: Secondary | ICD-10-CM

## 2017-07-29 NOTE — Progress Notes (Signed)
Patient: Benjamin Ford Male    DOB: Feb 07, 1961   56 y.o.   MRN: [id number] Visit Date: 07/29/2017  Today's Provider: Trey Sailors, PA-C   Chief Complaint  Patient presents with  . Chest Pain    Lower left; started yesterday   Subjective:      Benjamin Ford is a 56 y/o man presenting today with left sided thoracic pain ongoing since yesterday. He was recently on a flight from [facility] to [facility], got up twice during the flight. No history of cancer, surgery, hormone usage, immobilization. Says pain started suddenly, hurts to take deep breaths. Sitting here it is 3, breathing in makes the pain worse. Pain improved when he was mowing the lawn this morning. Denies SOB, Chest pain, leg swelling, history of DVT. He is able to point with a single finger to area of pain. Says he is concerned about pneumonia because of air quality in [place] and history of pneumonia.  Chest Pain   This is a new problem. The current episode started yesterday. The onset quality is sudden. The problem occurs constantly. The pain is present in the lateral region (Lower left.  Pt reports it is "Deeper than his ribs" feels like "Lung pain"). The quality of the pain is described as sharp. The pain does not radiate. Associated symptoms include shortness of breath (Pt reports it "hurts to breath"). Pertinent negatives include no cough or palpitations.     No Known Allergies   Current Outpatient Prescriptions:  .  albuterol (PROAIR HFA) 108 (90 BASE) MCG/ACT inhaler, Inhale into the lungs., Disp: , Rfl:   Review of Systems  Constitutional: Negative.   Respiratory: Positive for shortness of breath (Pt reports it "hurts to breath"). Negative for apnea, cough, choking, chest tightness, wheezing and stridor.   Cardiovascular: Positive for chest pain. Negative for palpitations and leg swelling.  Gastrointestinal: Negative.   Hematological: Does not bruise/bleed easily.    Social History  Substance Use  Topics  . Smoking status: Former Games developer  . Smokeless tobacco: Never Used     Comment: quit 4 yrs  . Alcohol use 0.0 oz/week     Comment: moderate   Objective:   BP 138/72 (BP Location: Left Arm, Patient Position: Sitting, Cuff Size: Normal)   Pulse 81   Temp 98.2 F (36.8 C) (Oral)   Wt 158 lb (71.7 kg)   SpO2 96%   BMI 24.75 kg/m  Vitals:   07/29/17 1531  BP: 138/72  Pulse: 81  Temp: 98.2 F (36.8 C)  TempSrc: Oral  SpO2: 96%  Weight: 158 lb (71.7 kg)     Physical Exam  Constitutional: He is oriented to person, place, and time. He appears well-developed and well-nourished.  Neck: Neck supple.  Cardiovascular: Normal rate and regular rhythm.   Pulmonary/Chest: Effort normal and breath sounds normal. No respiratory distress. He has no wheezes. He has no rales. He exhibits tenderness.  Point tenderness along lateral left 8-9 intercostal.   Lymphadenopathy:    He has no cervical adenopathy.  Neurological: He is alert and oriented to person, place, and time.  Skin: Skin is warm and dry.  Psychiatric: He has a normal mood and affect. His behavior is normal.        Assessment & Plan:     1. Costochondritis  CXR. Low suspicion for PE. Wells criteria 0. Treat with NSAIDs, either aleve or advil for one week. - DG Chest 2 View; Future  2. Rib pain on left side  - DG Chest 2 View; Future   Return if symptoms worsen or fail to improve.  The entirety of the information documented in the History of Present Illness, Review of Systems and Physical Exam were personally obtained by me. Portions of this information were initially documented by Kavin Leech, CMA and reviewed by me for thoroughness and accuracy.        Trey Sailors, PA-C  [facility]

## 2017-07-29 NOTE — Telephone Encounter (Signed)
Patient complains of shortness of breathe when taking a deep breathe and pain on the lower left base of rib area. He states pain started yesterday. He denies jaw pain, arm pain, and nausea. He has a history of asthma and used the Albuterol inhaler with no relief. Discussed with Toni Arthurs. He recommended patient come in this afternoon to be evaluated by [place]. Appointment made for 3:30 pm.

## 2017-07-29 NOTE — Patient Instructions (Addendum)
Take antiinflammatories of your choice, Advil or Aleve  Costochondritis Costochondritis is swelling and irritation (inflammation) of the tissue (cartilage) that connects your ribs to your breastbone (sternum). This causes pain in the front of your chest. Usually, the pain:  Starts gradually.  Is in more than one rib.  This condition usually goes away on its own over time. Follow these instructions at home:  Do not do anything that makes your pain worse.  If directed, put ice on the painful area: ? Put ice in a plastic bag. ? Place a towel between your skin and the bag. ? Leave the ice on for 20 minutes, 2-3 times a day.  If directed, put heat on the affected area as often as told by your doctor. Use the heat source that your doctor tells you to use, such as a moist heat pack or a heating pad. ? Place a towel between your skin and the heat source. ? Leave the heat on for 20-30 minutes. ? Take off the heat if your skin turns bright red. This is very important if you cannot feel pain, heat, or cold. You may have a greater risk of getting burned.  Take over-the-counter and prescription medicines only as told by your doctor.  Return to your normal activities as told by your doctor. Ask your doctor what activities are safe for you.  Keep all follow-up visits as told by your doctor. This is important. Contact a doctor if:  You have chills or a fever.  Your pain does not go away or it gets worse.  You have a cough that does not go away. Get help right away if:  You are short of breath. This information is not intended to replace advice given to you by your health care provider. Make sure you discuss any questions you have with your health care provider. Document Released: 05/11/2008 Document Revised: 06/12/2016 Document Reviewed: 03/18/2016 Elsevier Interactive Patient Education  [employer].

## 2017-07-30 LAB — PSA: Prostate Specific Ag, Serum: 0.9 ng/mL (ref 0.0–4.0)

## 2017-07-31 NOTE — Progress Notes (Signed)
4:29 PM   Benjamin Ford 02-02-1961 [id number]  Referring provider: Malva Limes, MD [address] [place], [place] [address]  Chief Complaint  Patient presents with  . Benign Prostatic Hypertrophy    HPI: Benjamin Ford is a 56 year old Caucasian male with a strong family history of PCa, with his brother and father both being diagnosed with PCa, and BPH with LUTS who presents today for a 6 month follow up.    His father was diagnosed at the age of 31 with PCa and his brother at the age of 8.  His brother recently underwent a prostatectomy.  His other brother has been followed for a rising PSA.    His IPSS score today is 2 , which is mild lower urinary tract symptomatology. He is delighted with his quality life due to his urinary symptoms.  His previous PVR is 20 mL.  His previous IPSS score was 2/0.    His major complaint today is nocturia x 1, but this is intermittent and not bothersome.   He has had these symptoms for 3 years.  He denies any dysuria, hematuria or suprapubic pain.   He also denies any recent chills, nausea or vomiting.         IPSS    Row Name 08/02/17 1500         International Prostate Symptom Score   How often have you had the sensation of not emptying your bladder? Not at All     How often have you had to urinate less than every two hours? Less than 1 in 5 times     How often have you found you stopped and started again several times when you urinated? Not at All     How often have you found it difficult to postpone urination? Not at All     How often have you had a weak urinary stream? Not at All     How often have you had to strain to start urination? Not at All     How many times did you typically get up at night to urinate? None     Total IPSS Score 1       Quality of Life due to urinary symptoms   If you were to spend the rest of your life with your urinary condition just the way it is now how would you feel about that? Delighted         Score:  1-7 Mild 8-19 Moderate 20-35 Severe    PMH: Past Medical History:  Diagnosis Date  . Anxiety   . Asthma   . BPH (benign prostatic hyperplasia)   . GERD (gastroesophageal reflux disease)   . HTN (hypertension)   . Insomnia   . Palpitations   . Stomach ulcer   . Testicular mass     Surgical History: Past Surgical History:  Procedure Laterality Date  . INGUINAL HERNIA REPAIR  2008  . MASTECTOMY Bilateral 1992/1981  . VASECTOMY  1983    Home Medications:  Allergies as of 08/02/2017   No Known Allergies     Medication List       Accurate as of 08/02/17  4:29 PM. Always use your most recent med list.          PROAIR HFA 108 (90 Base) MCG/ACT inhaler Generic drug:  albuterol Inhale into the lungs.       Allergies: No Known Allergies  Family History: Family History  Problem  Relation Age of Onset  . Prostate cancer Father   . Cancer Father        bone, prostate  . COPD Mother   . Cancer Mother        lung  . Cancer Maternal Grandmother        brain  . Aneurysm Paternal Grandmother   . Prostate cancer Brother   . Kidney disease Neg Hx   . Bladder Cancer Neg Hx     Social History:  reports that he has quit smoking. He has never used smokeless tobacco. He reports that he drinks alcohol. He reports that he does not use drugs.  ROS: UROLOGY Frequent Urination?: No Hard to postpone urination?: No Burning/pain with urination?: No Get up at night to urinate?: No Leakage of urine?: No Urine stream starts and stops?: No Trouble starting stream?: No Do you have to strain to urinate?: No Blood in urine?: No Urinary tract infection?: No Sexually transmitted disease?: No Injury to kidneys or bladder?: No Painful intercourse?: No Weak stream?: No Erection problems?: No Penile pain?: No  Gastrointestinal Nausea?: No Vomiting?: No Indigestion/heartburn?: No Diarrhea?: No Constipation?: No  Constitutional Fever: No Night sweats?:  No Weight loss?: No Fatigue?: No  Skin Skin rash/lesions?: No Itching?: No  Eyes Blurred vision?: No Double vision?: No  Ears/Nose/Throat Sore throat?: No Sinus problems?: No  Hematologic/Lymphatic Swollen glands?: No Easy bruising?: No  Cardiovascular Leg swelling?: No Chest pain?: No  Respiratory Cough?: No Shortness of breath?: No  Endocrine Excessive thirst?: No  Musculoskeletal Back pain?: No Joint pain?: No  Neurological Headaches?: No Dizziness?: No  Psychologic Depression?: No Anxiety?: No  Physical Exam: BP (!) 142/73 (BP Location: Left Arm, Patient Position: Sitting, Cuff Size: Normal)   Pulse 67   Ht 5\' 7"  (1.702 m)   Wt 155 lb 11.2 oz (70.6 kg)   BMI 24.39 kg/m   GU: Patient with circumcised phallus.   Urethral meatus is patent.  No penile discharge. No penile lesions or rashes. Scrotum without lesions, cysts, rashes and/or edema.  Testicles are located scrotally bilaterally. No masses are appreciated in the testicles. Left and right epididymis are normal. Right spermatocele ( 3 cm x 3cm) is noted. Rectal: Patient with  normal sphincter tone. Perineum without scarring or rashes. No rectal masses are appreciated. Prostate is approximately 45 grams, no nodules are appreciated. Seminal vesicles are normal.  Laboratory Data: Previous PSA's:     0.6 ng/mL on 06/26/2014     0.7 ng/mL on 12/27/2014     0.8 ng/mL on 07/20/2016  Results for orders placed or performed in visit on 01/16/16  PSA  Result Value Ref Range   Prostate Specific Ag, Serum 0.7 0.0 - 4.0 ng/mL   PSA  0.8 ng/mL on 01/25/2017 PSA   0.9 ng/mL on 07/29/2017   Assessment & Plan  1. BPH with LUTS  - IPSS score is 2/0, it is stable  - Continue conservative management, avoiding bladder irritants and timed voiding's  - RTC in 6 months for IPSS, PSA and exam   2. Family history of prostate cancer:   Patient's father and brother were both diagnosed with prostate cancer in their  65's.  We will continue to monitor every 6 months with PSA and DRE.   Return in about 6 months (around 02/02/2018) for IPSS, PSA and exam.  Montgomery Favor, PA-C

## 2017-08-02 ENCOUNTER — Ambulatory Visit (INDEPENDENT_AMBULATORY_CARE_PROVIDER_SITE_OTHER): Payer: BLUE CROSS/BLUE SHIELD | Admitting: Urology

## 2017-08-02 ENCOUNTER — Encounter: Payer: Self-pay | Admitting: Urology

## 2017-08-02 VITALS — BP 142/73 | HR 67 | Ht 67.0 in | Wt 155.7 lb

## 2017-08-02 DIAGNOSIS — Z8042 Family history of malignant neoplasm of prostate: Secondary | ICD-10-CM | POA: Diagnosis not present

## 2017-08-02 DIAGNOSIS — N401 Enlarged prostate with lower urinary tract symptoms: Secondary | ICD-10-CM | POA: Diagnosis not present

## 2017-08-02 DIAGNOSIS — N138 Other obstructive and reflux uropathy: Secondary | ICD-10-CM

## 2017-10-20 ENCOUNTER — Encounter: Payer: Self-pay | Admitting: Physician Assistant

## 2017-10-20 ENCOUNTER — Ambulatory Visit (INDEPENDENT_AMBULATORY_CARE_PROVIDER_SITE_OTHER): Payer: BLUE CROSS/BLUE SHIELD | Admitting: Physician Assistant

## 2017-10-20 ENCOUNTER — Ambulatory Visit
Admission: RE | Admit: 2017-10-20 | Discharge: 2017-10-20 | Disposition: A | Discharge status: Home / Self Care | Payer: BLUE CROSS/BLUE SHIELD | Source: Ambulatory Visit | Attending: Physician Assistant | Admitting: Physician Assistant

## 2017-10-20 VITALS — BP 138/72 | HR 72 | Temp 98.2°F | Resp 16 | Wt 159.0 lb

## 2017-10-20 DIAGNOSIS — R509 Fever, unspecified: Secondary | ICD-10-CM | POA: Diagnosis present

## 2017-10-20 DIAGNOSIS — R05 Cough: Secondary | ICD-10-CM

## 2017-10-20 DIAGNOSIS — R059 Cough, unspecified: Secondary | ICD-10-CM

## 2017-10-20 MED ORDER — ALBUTEROL SULFATE HFA 108 (90 BASE) MCG/ACT IN AERS
2.0000 | INHALATION_SPRAY | Freq: Four times a day (QID) | RESPIRATORY_TRACT | 2 refills | Status: DC | PRN
Start: 2017-10-20 — End: 2018-10-26

## 2017-10-20 MED ORDER — LEVOFLOXACIN 750 MG PO TABS: 750.0000 mg | ORAL_TABLET | Freq: Every day | ORAL | 0 refills | Status: AC

## 2017-10-20 NOTE — Progress Notes (Signed)
Patient: Benjamin Ford Male    DOB: [birth year]   56 y.o.   MRN: [id number] Visit Date: 10/21/2017  Today's Provider: Trey SailorsAdriana M Pollak, PA-C   Chief Complaint  Patient presents with  . Fever    103.6 last night  . URI   Subjective:    Benjamin Ford is a 56 y/o man presenting today for respiratory symptoms x 1 week. Reports fever last night was 103.6 measured with forehead probe. Endorses fatigue, sore throat, coughing, difficulty breathing, disorientation. No nausea or vomiting.  Fever   This is a new problem. The current episode started in the past 7 days. The problem has been waxing and waning. Maximum temperature: Highest temp was 103.6 which was last night. The temperature was taken using a tympanic thermometer. Associated symptoms include congestion, coughing, a sore throat and wheezing. Pertinent negatives include no abdominal pain, ear pain, headaches, nausea, urinary pain or vomiting. He has tried acetaminophen for the symptoms. The treatment provided mild relief.  URI   This is a new problem. The current episode started 1 to 4 weeks ago. The maximum temperature recorded prior to his arrival was 103 - 104 F. Associated symptoms include congestion, coughing, a plugged ear sensation, rhinorrhea, sinus pain, a sore throat and wheezing. Pertinent negatives include no abdominal pain, dysuria, ear pain, headaches, nausea, neck pain, swollen glands or vomiting. He has tried NSAIDs and decongestant for the symptoms. The treatment provided mild relief.       No Known Allergies   Current Outpatient Medications:  .  albuterol (PROVENTIL HFA;VENTOLIN HFA) 108 (90 Base) MCG/ACT inhaler, Inhale 2 puffs every 6 (six) hours as needed into the lungs for wheezing or shortness of breath., Disp: 1 Inhaler, Rfl: 2 .  levofloxacin (LEVAQUIN) 750 MG tablet, Take 1 tablet (750 mg total) daily for 5 days by mouth., Disp: 5 tablet, Rfl: 0  Review of Systems  Constitutional: Positive for  chills, diaphoresis, fatigue and fever. Negative for activity change, appetite change and unexpected weight change.  HENT: Positive for congestion, postnasal drip, rhinorrhea, sinus pressure, sinus pain, sore throat and trouble swallowing. Negative for ear discharge, ear pain and nosebleeds.   Eyes: Positive for discharge. Negative for photophobia, pain, redness, itching and visual disturbance.  Respiratory: Positive for cough, chest tightness, shortness of breath and wheezing. Negative for apnea, choking and stridor.   Gastrointestinal: Negative for abdominal pain, nausea and vomiting.  Genitourinary: Negative for dysuria.  Musculoskeletal: Positive for neck stiffness. Negative for neck pain.  Neurological: Negative for headaches.    Social History   Tobacco Use  . Smoking status: Former Games developermoker  . Smokeless tobacco: Never Used  . Tobacco comment: quit 4 yrs  Substance Use Topics  . Alcohol use: Yes    Alcohol/week: 0.0 oz    Comment: moderate   Objective:   BP 138/72 (BP Location: Left Arm, Patient Position: Sitting, Cuff Size: Normal)   Pulse 72   Temp 98.2 F (36.8 C) (Oral)   Resp 16   Wt 159 lb (72.1 kg)   SpO2 95%   BMI 24.90 kg/m  Vitals:   10/20/17 1633  BP: 138/72  Pulse: 72  Resp: 16  Temp: 98.2 F (36.8 C)  TempSrc: Oral  SpO2: 95%  Weight: 159 lb (72.1 kg)     Physical Exam  Constitutional: He is oriented to person, place, and time. He appears well-developed and well-nourished. He appears ill.  HENT:  Right Ear: External  ear normal.  Left Ear: External ear normal.  Mouth/Throat: Posterior oropharyngeal erythema present.  Tms opaque bilaterally.  Eyes: Conjunctivae are normal.  Neck: Normal range of motion. Neck supple. No neck rigidity. No edema and normal range of motion present.  Cardiovascular: Normal rate and regular rhythm.  Pulmonary/Chest: Effort normal and breath sounds normal.  Lymphadenopathy:    He has cervical adenopathy.  Neurological:  He is alert and oriented to person, place, and time.  Skin: Skin is warm and dry.  Psychiatric: He has a normal mood and affect. His behavior is normal.        Assessment & Plan:     1. Fever, unspecified fever cause  Pt appears ill but nontoxic. Rapid flu negative. No meningeal signs, fever has subsided. No adverse lung sounds but pulse ox 95%, will get CXR and treat empirically for pneumonia. Will see him back on Friday. Cautioned on return signs/symptoms.  - DG Chest 2 View; Future - levofloxacin (LEVAQUIN) 750 MG tablet; Take 1 tablet (750 mg total) daily for 5 days by mouth.  Dispense: 5 tablet; Refill: 0 - albuterol (PROVENTIL HFA;VENTOLIN HFA) 108 (90 Base) MCG/ACT inhaler; Inhale 2 puffs every 6 (six) hours as needed into the lungs for wheezing or shortness of breath.  Dispense: 1 Inhaler; Refill: 2 - POCT Influenza A/B  2. Cough  - DG Chest 2 View; Future - levofloxacin (LEVAQUIN) 750 MG tablet; Take 1 tablet (750 mg total) daily for 5 days by mouth.  Dispense: 5 tablet; Refill: 0 - albuterol (PROVENTIL HFA;VENTOLIN HFA) 108 (90 Base) MCG/ACT inhaler; Inhale 2 puffs every 6 (six) hours as needed into the lungs for wheezing or shortness of breath.  Dispense: 1 Inhaler; Refill: 2 - POCT Influenza A/B  Return in about 2 days (around 10/22/2017) for repsiratory illness.  The entirety of the information documented in the History of Present Illness, Review of Systems and Physical Exam were personally obtained by me. Portions of this information were initially documented by Kavin LeechLaura Walsh, CMA and reviewed by me for thoroughness and accuracy.         Trey SailorsAdriana M Pollak, PA-C  [facility]

## 2017-10-20 NOTE — Patient Instructions (Signed)

## 2017-10-21 ENCOUNTER — Telehealth: Payer: Self-pay

## 2017-10-21 LAB — POCT INFLUENZA A/B
Influenza A, POC: NEGATIVE
Influenza B, POC: NEGATIVE

## 2017-10-21 NOTE — Telephone Encounter (Signed)
Patient advised as directed below. Patient scheduled at 8:30 this Friday.   Thanks,  -Joseline

## 2017-10-21 NOTE — Telephone Encounter (Signed)
-----   Message from Trey SailorsAdriana M Pollak, [place]-C sent at 10/21/2017  8:32 AM EST ----- CXR did not show pneumonia, came back normal. Patient can still complete antibiotics. Please push lots of fluids. Have him come back to see us on Friday, I have my 8:30 AM blocked for him so you may use that. Thanks!

## 2017-10-22 ENCOUNTER — Encounter: Payer: Self-pay | Admitting: Physician Assistant

## 2017-10-22 ENCOUNTER — Ambulatory Visit (INDEPENDENT_AMBULATORY_CARE_PROVIDER_SITE_OTHER): Payer: BLUE CROSS/BLUE SHIELD | Admitting: Physician Assistant

## 2017-10-22 VITALS — BP 142/88 | HR 73 | Temp 98.5°F | Resp 16 | Wt 157.0 lb

## 2017-10-22 DIAGNOSIS — R05 Cough: Secondary | ICD-10-CM | POA: Diagnosis not present

## 2017-10-22 DIAGNOSIS — R509 Fever, unspecified: Secondary | ICD-10-CM | POA: Diagnosis not present

## 2017-10-22 DIAGNOSIS — R059 Cough, unspecified: Secondary | ICD-10-CM

## 2017-10-22 NOTE — Progress Notes (Signed)
Patient: Benjamin Ford Male    DOB: September 15, 1961   56 y.o.   MRN: [id number] Visit Date: 10/22/2017  Today's Provider: Trey SailorsAdriana M Pollak, PA-C   Chief Complaint  Patient presents with  . URI    Two day follow up  . Fever   Subjective:    Benjamin Ford is 56 y/o man following up today for URI. He was seen two days ago in clinic with trouble breathing, fever, and cough. His chest xray on 10/20/2017 was clear of infiltrated or pneumonia. He was started on Levofloxacin empirically and has been taking this consistently, tolerating it well. He feels much improved. He feels his cough has become productive and he is able to clear his airway. He has been using his inhaler consistently. He denies fevers, chills, nausea, vomiting.   URI   This is a new problem. The current episode started in the past 7 days. The problem has been gradually improving. There has been no fever (As of today). Associated symptoms include coughing. Pertinent negatives include no abdominal pain, chest pain, congestion, diarrhea, dysuria, ear pain, headaches, joint pain, joint swelling, nausea, neck pain, plugged ear sensation, rash, rhinorrhea, sinus pain, sneezing, sore throat, swollen glands, vomiting or wheezing. He has tried inhaler use (Antibiotic ) for the symptoms. The treatment provided significant relief.  Fever   This is a new problem. The current episode started in the past 7 days. The problem has been resolved. Associated symptoms include coughing. Pertinent negatives include no abdominal pain, chest pain, congestion, diarrhea, ear pain, headaches, nausea, rash, sore throat, urinary pain, vomiting or wheezing. Treatments tried: Antibiotic. The treatment provided significant relief.       No Known Allergies   Current Outpatient Medications:  .  albuterol (PROVENTIL HFA;VENTOLIN HFA) 108 (90 Base) MCG/ACT inhaler, Inhale 2 puffs every 6 (six) hours as needed into the lungs for wheezing or shortness of  breath., Disp: 1 Inhaler, Rfl: 2 .  levofloxacin (LEVAQUIN) 750 MG tablet, Take 1 tablet (750 mg total) daily for 5 days by mouth., Disp: 5 tablet, Rfl: 0  Review of Systems  Constitutional: Positive for fever. Negative for activity change, appetite change, chills, diaphoresis, fatigue and unexpected weight change.  HENT: Negative.  Negative for congestion, ear pain, rhinorrhea, sinus pain, sneezing and sore throat.   Respiratory: Positive for cough and chest tightness. Negative for apnea, choking, shortness of breath, wheezing and stridor.   Cardiovascular: Negative for chest pain.  Gastrointestinal: Negative.  Negative for abdominal pain, diarrhea, nausea and vomiting.  Genitourinary: Negative for dysuria.  Musculoskeletal: Negative for joint pain and neck pain.  Skin: Negative for rash.  Neurological: Negative for headaches.    Social History   Tobacco Use  . Smoking status: Former Games developermoker  . Smokeless tobacco: Never Used  . Tobacco comment: quit 4 yrs  Substance Use Topics  . Alcohol use: Yes    Alcohol/week: 0.0 oz    Comment: moderate   Objective:   BP (!) 142/88 (BP Location: Right Arm, Patient Position: Sitting, Cuff Size: Normal)   Pulse 73   Temp 98.5 F (36.9 C) (Oral)   Resp 16   Wt 157 lb (71.2 kg)   BMI 24.59 kg/m  Vitals:   10/22/17 0843  BP: (!) 142/88  Pulse: 73  Resp: 16  Temp: 98.5 F (36.9 C)  TempSrc: Oral  Weight: 157 lb (71.2 kg)     Physical Exam  Constitutional: He is oriented to  person, place, and time. He appears well-developed and well-nourished.  HENT:  Right Ear: Tympanic membrane and external ear normal.  Left Ear: Tympanic membrane and external ear normal.  Mouth/Throat: Oropharynx is clear and moist. No oropharyngeal exudate, posterior oropharyngeal edema or posterior oropharyngeal erythema.  Cardiovascular: Normal rate and regular rhythm.  Pulmonary/Chest: Effort normal and breath sounds normal.  Neurological: He is alert and  oriented to person, place, and time.  Skin: Skin is warm and dry.  Psychiatric: He has a normal mood and affect. His behavior is normal.        Assessment & Plan:     1. Cough  Much improved, pulse ox better. Can continue course of medications.  2. Fever, unspecified fever cause  The entirety of the information documented in the History of Present Illness, Review of Systems and Physical Exam were personally obtained by me. Portions of this information were initially documented by Kavin LeechLaura Walsh, CMA and reviewed by me for thoroughness and accuracy.   Return if symptoms worsen or fail to improve.         Trey SailorsAdriana M Pollak, PA-C  [facility]

## 2017-10-22 NOTE — Patient Instructions (Signed)

## 2017-11-22 ENCOUNTER — Telehealth: Payer: Self-pay | Admitting: Family Medicine

## 2017-11-22 DIAGNOSIS — R05 Cough: Secondary | ICD-10-CM

## 2017-11-22 DIAGNOSIS — R059 Cough, unspecified: Secondary | ICD-10-CM

## 2017-11-22 NOTE — Telephone Encounter (Signed)
Pt states she was in a week or 2 ago with cough and congestion.  Pt states he still has a cough at night.  Pt is asking if he can get a Rx to help with the cough. [facility]  CB#[phone]/MW

## 2017-11-23 MED ORDER — BENZONATATE 100 MG PO CAPS: 100.0000 mg | ORAL_CAPSULE | Freq: Three times a day (TID) | ORAL | 0 refills | Status: AC | PRN

## 2017-11-23 NOTE — Telephone Encounter (Signed)
Last seen 10/24/2017. He may start taking a daily allergy medication like Zyrtec or Allegra. He can do Delsym over the counter. I am sending in some Tessalon perles to [facility] in case this does not help. May take one pill three times daily PRN. If he develops fever, cough becomes productive, etc. Please have him be seen.

## 2017-11-23 NOTE — Telephone Encounter (Signed)
Pt advised.   Thanks,   -Lasheka Kempner  

## 2017-12-01 ENCOUNTER — Encounter: Payer: Self-pay | Admitting: Family Medicine

## 2017-12-01 ENCOUNTER — Ambulatory Visit (INDEPENDENT_AMBULATORY_CARE_PROVIDER_SITE_OTHER): Payer: BLUE CROSS/BLUE SHIELD | Admitting: Family Medicine

## 2017-12-01 VITALS — BP 154/78 | HR 70 | Temp 98.7°F | Resp 16 | Wt 164.0 lb

## 2017-12-01 DIAGNOSIS — R05 Cough: Secondary | ICD-10-CM | POA: Diagnosis not present

## 2017-12-01 DIAGNOSIS — R053 Chronic cough: Secondary | ICD-10-CM

## 2017-12-01 MED ORDER — OMEPRAZOLE 40 MG PO CPDR: 40.0000 mg | DELAYED_RELEASE_CAPSULE | Freq: Every day | ORAL | 1 refills | Status: DC

## 2017-12-01 NOTE — Progress Notes (Signed)
       Patient: Benjamin Ford Male    DOB: 1961-06-29   56 y.o.   MRN: [id number] Visit Date: 12/01/2017  Today's Provider: Mila Merryonald Rickeya Manus, MD   Chief Complaint  Patient presents with  . Cough   Subjective:    HPI Patient comes in today c/o cough and congestion. He was seen in the office on 10/20/2017 and was treated with Levaquin 750mg  daily for 5 days. He also had a CXR which was clear. Patient reports that he has been using the rescue inhaler 2-3 times daily. He also is experiencing a chest tightness when he coughs. States cough never really improved. He did have some fever prior to his office visit in November, but has had none for 4-5 weeks. No significant nasal congestion or drainage. No asthma history. Smoked off and from his mid 4320s to mid 3840s, but never over one ppd.   He does report history of moderate to severe GERD previously on chronic PPIs. He made dietary changes about 10 years ago and was able to get off of medications. He has had persistent sore throat with the cough, but no heartburn. No dysphagia. Cough is not positional.     No Known Allergies   Current Outpatient Medications:  .  albuterol (PROVENTIL HFA;VENTOLIN HFA) 108 (90 Base) MCG/ACT inhaler, Inhale 2 puffs every 6 (six) hours as needed into the lungs for wheezing or shortness of breath., Disp: 1 Inhaler, Rfl: 2  Review of Systems  Constitutional: Positive for activity change and fatigue.  HENT: Positive for congestion and postnasal drip.   Respiratory: Positive for cough, chest tightness, shortness of breath and wheezing.   Neurological: Positive for dizziness, light-headedness and headaches.       Dizzy spells comes and goes.     Social History   Tobacco Use  . Smoking status: Former Games developermoker  . Smokeless tobacco: Never Used  . Tobacco comment: quit 4 yrs  Substance Use Topics  . Alcohol use: Yes    Alcohol/week: 0.0 oz    Comment: moderate   Objective:   BP (!) 154/78 (BP Location: Right  Arm, Cuff Size: Normal)   Pulse 70   Temp 98.7 F (37.1 C)   Resp 16   Wt 164 lb (74.4 kg)   SpO2 98%   BMI 25.69 kg/m     Physical Exam  General Appearance:    Alert, cooperative, no distress  HENT:   bilateral TM normal without fluid or infection, neck without nodes, throat normal without erythema or exudate and sinuses nontender  Eyes:    PERRL, conjunctiva/corneas clear, EOM's intact       Lungs:     Clear to auscultation bilaterally, respirations unlabored  Heart:    Regular rate and rhythm  Neurologic:   Awake, alert, oriented x 3. No apparent focal neurological           defect.      Spirometry: normal..      Assessment & Plan:     1. Chronic coughing No sign of active infection. Likely post viral cough syndrome versus atypical GERD. Considering known history of GERD and normal spirometry, will try PPI. Prescription sent for omeprazole 40mg  daily, #30. If not much better in10-14 days will refer pulmonary for chronic cough.  - Spirometry with graph; Future - Spirometry with graph       Benjamin Merryonald Lawrie Tunks, MD  [facility]

## 2018-01-25 ENCOUNTER — Other Ambulatory Visit: Payer: Self-pay

## 2018-01-25 ENCOUNTER — Other Ambulatory Visit: Payer: BLUE CROSS/BLUE SHIELD

## 2018-01-25 DIAGNOSIS — N401 Enlarged prostate with lower urinary tract symptoms: Secondary | ICD-10-CM

## 2018-01-26 LAB — PSA: Prostate Specific Ag, Serum: 1 ng/mL (ref 0.0–4.0)

## 2018-02-01 ENCOUNTER — Encounter: Payer: Self-pay | Admitting: Urology

## 2018-02-01 ENCOUNTER — Ambulatory Visit (INDEPENDENT_AMBULATORY_CARE_PROVIDER_SITE_OTHER): Payer: BLUE CROSS/BLUE SHIELD | Admitting: Urology

## 2018-02-01 VITALS — BP 158/90 | HR 60 | Ht 67.0 in | Wt 154.0 lb

## 2018-02-01 DIAGNOSIS — N401 Enlarged prostate with lower urinary tract symptoms: Secondary | ICD-10-CM

## 2018-02-01 DIAGNOSIS — Z8042 Family history of malignant neoplasm of prostate: Secondary | ICD-10-CM

## 2018-02-01 DIAGNOSIS — N138 Other obstructive and reflux uropathy: Secondary | ICD-10-CM | POA: Diagnosis not present

## 2018-02-01 NOTE — Progress Notes (Signed)
3:13 PM   Benjamin Ford February 15, 1961 [id number]  Referring provider: Malva Limes, MD [address] [place], [place] [address]  Chief Complaint  Patient presents with  . Benign Prostatic Hypertrophy  . Follow-up    HPI: Benjamin Ford is a 57 year old Caucasian male with a strong family history of PCa, with his brother and father both being diagnosed with PCa, and BPH with LUTS who presents today for a 6 month follow up.    His father was diagnosed at the age of 43 with PCa and his brother at the age of 56.  His brother recently underwent a prostatectomy.  His other brother has been followed for a rising PSA.    His IPSS score today is 2 , which is mild lower urinary tract symptomatology. He is delighted with his quality life due to his urinary symptoms.  His previous PVR is 20 mL.  His previous IPSS score was 2/0.    His major complaint today is nocturia x 1 and frequency, but this is intermittent and not bothersome.   He has had these symptoms for 4 years.  He denies any dysuria, hematuria or suprapubic pain.   He also denies any recent chills, nausea or vomiting.     IPSS    Row Name 02/01/18 1500         International Prostate Symptom Score   How often have you had the sensation of not emptying your bladder?  Not at All     How often have you had to urinate less than every two hours?  Less than 1 in 5 times     How often have you found you stopped and started again several times when you urinated?  Not at All     How often have you found it difficult to postpone urination?  Not at All     How often have you had a weak urinary stream?  Not at All     How often have you had to strain to start urination?  Not at All     How many times did you typically get up at night to urinate?  1 Time     Total IPSS Score  2       Quality of Life due to urinary symptoms   If you were to spend the rest of your life with your urinary condition just the way it is now how  would you feel about that?  Delighted        Score:  1-7 Mild 8-19 Moderate 20-35 Severe    PMH: Past Medical History:  Diagnosis Date  . Anxiety   . Asthma   . BPH (benign prostatic hyperplasia)   . GERD (gastroesophageal reflux disease)   . HTN (hypertension)   . Insomnia   . Palpitations   . Stomach ulcer   . Testicular mass     Surgical History: Past Surgical History:  Procedure Laterality Date  . INGUINAL HERNIA REPAIR  2008  . MASTECTOMY Bilateral 1992/1981  . VASECTOMY  1983    Home Medications:  Allergies as of 02/01/2018   No Known Allergies     Medication List        Accurate as of 02/01/18  3:13 PM. Always use your most recent med list.          albuterol 108 (90 Base) MCG/ACT inhaler Commonly known as:  PROVENTIL HFA;VENTOLIN HFA Inhale 2 puffs every 6 (six) hours  as needed into the lungs for wheezing or shortness of breath.   omeprazole 40 MG capsule Commonly known as:  PRILOSEC Take 1 capsule (40 mg total) by mouth daily.       Allergies: No Known Allergies  Family History: Family History  Problem Relation Age of Onset  . Prostate cancer Father   . Cancer Father        bone, prostate  . COPD Mother   . Cancer Mother        lung  . Cancer Maternal Grandmother        brain  . Aneurysm Paternal Grandmother   . Prostate cancer Brother   . Kidney disease Neg Hx   . Bladder Cancer Neg Hx     Social History:  reports that he has quit smoking. he has never used smokeless tobacco. He reports that he drinks alcohol. He reports that he does not use drugs.  ROS: UROLOGY Frequent Urination?: No Hard to postpone urination?: No Burning/pain with urination?: No Get up at night to urinate?: No Leakage of urine?: No Urine stream starts and stops?: No Trouble starting stream?: No Do you have to strain to urinate?: No Blood in urine?: No Urinary tract infection?: No Sexually transmitted disease?: No Injury to kidneys or bladder?:  No Painful intercourse?: No Weak stream?: No Erection problems?: No Penile pain?: No  Gastrointestinal Nausea?: No Vomiting?: No Indigestion/heartburn?: No Diarrhea?: No Constipation?: No  Constitutional Fever: No Night sweats?: No Weight loss?: No Fatigue?: No  Skin Skin rash/lesions?: No Itching?: No  Eyes Blurred vision?: No Double vision?: No  Ears/Nose/Throat Sore throat?: No Sinus problems?: No  Hematologic/Lymphatic Swollen glands?: No Easy bruising?: No  Cardiovascular Leg swelling?: No Chest pain?: No  Respiratory Cough?: No Shortness of breath?: No  Endocrine Excessive thirst?: No  Musculoskeletal Back pain?: No Joint pain?: No  Neurological Headaches?: No Dizziness?: No  Psychologic Depression?: No Anxiety?: No  Physical Exam: BP (!) 158/90   Pulse 60   Ht 5\' 7"  (1.702 m)   Wt 154 lb (69.9 kg)   BMI 24.12 kg/m   Constitutional: Well nourished. Alert and oriented, No acute distress. HEENT: [place] AT, moist mucus membranes. Trachea midline, no masses. Cardiovascular: No clubbing, cyanosis, or edema. Respiratory: Normal respiratory effort, no increased work of breathing. GI: Abdomen is soft, non tender, non distended, no abdominal masses. Liver and spleen not palpable.  No hernias appreciated.  Stool sample for occult testing is not indicated.   GU: No CVA tenderness.  No bladder fullness or masses.  Patient with circumcised phallus.  Urethral meatus is patent.  No penile discharge. No penile lesions or rashes. Scrotum without lesions, cysts, rashes and/or edema.  Testicles are located scrotally bilaterally. No masses are appreciated in the testicles. Left and right epididymis are normal.  (Right spermatocele (3 cm x 3 cm).   Rectal: Patient with  normal sphincter tone. Anus and perineum without scarring or rashes. No rectal masses are appreciated. Prostate is approximately 45 grams, no nodules are appreciated. Seminal vesicles are  normal. Skin: No rashes, bruises or suspicious lesions. Lymph: No cervical or inguinal adenopathy. Neurologic: Grossly intact, no focal deficits, moving all 4 extremities. Psychiatric: Normal mood and affect.   Laboratory Data: Previous PSA's:     0.6 ng/mL on 06/26/2014     0.7 ng/mL on 12/27/2014     0.8 ng/mL on 07/20/2016  Results for orders placed or performed in visit on 01/16/16  PSA  Result Value Ref Range  Prostate Specific Ag, Serum 0.7 0.0 - 4.0 ng/mL   PSA  0.8 ng/mL on 01/25/2017 PSA   0.9 ng/mL on 07/29/2017 PSA  1.0 ng/mL on 01/25/2018  Assessment & Plan  1. BPH with LUTS  - IPSS score is 2/0, it is stable  - Continue conservative management, avoiding bladder irritants and timed voiding's  - RTC in 6 months for IPSS, PSA and exam   2. Family history of prostate cancer:   Patient's father and brother were both diagnosed with prostate cancer in their 8650's.  We will continue to monitor every 6 months with PSA and DRE.   Return for IPSS, PSA and exam.  Rane Dumm, PA-C

## 2018-08-02 ENCOUNTER — Other Ambulatory Visit: Payer: Self-pay

## 2018-08-02 DIAGNOSIS — N138 Other obstructive and reflux uropathy: Secondary | ICD-10-CM

## 2018-08-02 DIAGNOSIS — N401 Enlarged prostate with lower urinary tract symptoms: Secondary | ICD-10-CM

## 2018-08-03 ENCOUNTER — Other Ambulatory Visit: Payer: BLUE CROSS/BLUE SHIELD

## 2018-08-03 DIAGNOSIS — N401 Enlarged prostate with lower urinary tract symptoms: Secondary | ICD-10-CM

## 2018-08-03 DIAGNOSIS — N138 Other obstructive and reflux uropathy: Secondary | ICD-10-CM

## 2018-08-04 LAB — PSA: Prostate Specific Ag, Serum: 0.6 ng/mL (ref 0.0–4.0)

## 2018-08-09 ENCOUNTER — Ambulatory Visit (INDEPENDENT_AMBULATORY_CARE_PROVIDER_SITE_OTHER): Payer: BLUE CROSS/BLUE SHIELD | Admitting: Urology

## 2018-08-09 ENCOUNTER — Other Ambulatory Visit: Payer: Self-pay

## 2018-08-09 ENCOUNTER — Encounter: Payer: Self-pay | Admitting: Urology

## 2018-08-09 VITALS — BP 125/68 | HR 62 | Ht 67.0 in | Wt 143.9 lb

## 2018-08-09 DIAGNOSIS — N401 Enlarged prostate with lower urinary tract symptoms: Secondary | ICD-10-CM | POA: Diagnosis not present

## 2018-08-09 DIAGNOSIS — Z8042 Family history of malignant neoplasm of prostate: Secondary | ICD-10-CM | POA: Diagnosis not present

## 2018-08-09 DIAGNOSIS — N138 Other obstructive and reflux uropathy: Secondary | ICD-10-CM

## 2018-08-09 NOTE — Progress Notes (Signed)
3:21 PM   Benjamin Ford [birth year] [id number]  Referring provider: Malva Limes, MD [address] [place], [place] [address]  Chief Complaint  Patient presents with  . Benign Prostatic Hypertrophy    HPI: Benjamin Ford is a 57 year old Caucasian male with a strong family history of PCa, with his brother and father both being diagnosed with PCa, and BPH with LUTS who presents today for a 6 month follow up.    His father was diagnosed at the age of 26 with PCa and his brother at the age of 67.  His brother recently underwent a prostatectomy.    His other brother has been followed for a rising PSA, but it was determined to be an infection.    He is not having any pressing urinary issues at this time.  Patient denies any gross hematuria, dysuria or suprapubic/flank pain.  Patient denies any fevers, chills, nausea or vomiting.   PMH: Past Medical History:  Diagnosis Date  . Anxiety   . Asthma   . BPH (benign prostatic hyperplasia)   . GERD (gastroesophageal reflux disease)   . HTN (hypertension)   . Insomnia   . Palpitations   . Stomach ulcer   . Testicular mass     Surgical History: Past Surgical History:  Procedure Laterality Date  . INGUINAL HERNIA REPAIR  2008  . MASTECTOMY Bilateral 1992/1981  . VASECTOMY  1983    Home Medications:  Allergies as of 08/09/2018   No Known Allergies     Medication List        Accurate as of 08/09/18  3:21 PM. Always use your most recent med list.          albuterol 108 (90 Base) MCG/ACT inhaler Commonly known as:  PROVENTIL HFA;VENTOLIN HFA Inhale 2 puffs every 6 (six) hours as needed into the lungs for wheezing or shortness of breath.   omeprazole 40 MG capsule Commonly known as:  PRILOSEC Take 1 capsule (40 mg total) by mouth daily.       Allergies: No Known Allergies  Family History: Family History  Problem Relation Age of Onset  . Prostate cancer Father   . Cancer Father        bone, prostate    . COPD Mother   . Cancer Mother        lung  . Cancer Maternal Grandmother        brain  . Aneurysm Paternal Grandmother   . Prostate cancer Brother   . Kidney disease Neg Hx   . Bladder Cancer Neg Hx     Social History:  reports that he has quit smoking. He has never used smokeless tobacco. He reports that he drinks alcohol. He reports that he does not use drugs.  ROS: UROLOGY Frequent Urination?: No Hard to postpone urination?: No Burning/pain with urination?: No Get up at night to urinate?: No Leakage of urine?: No Urine stream starts and stops?: No Trouble starting stream?: No Do you have to strain to urinate?: No Blood in urine?: No Urinary tract infection?: No Sexually transmitted disease?: No Injury to kidneys or bladder?: No Painful intercourse?: No Weak stream?: No Erection problems?: No Penile pain?: No  Gastrointestinal Nausea?: No Vomiting?: No Indigestion/heartburn?: No Diarrhea?: No Constipation?: No  Constitutional Fever: No Night sweats?: No Weight loss?: No Fatigue?: No  Skin Skin rash/lesions?: No Itching?: No  Eyes Blurred vision?: No Double vision?: No  Ears/Nose/Throat Sore throat?: No Sinus problems?: No  Hematologic/Lymphatic Swollen glands?: No Easy bruising?: No  Cardiovascular Leg swelling?: No Chest pain?: No  Respiratory Cough?: No Shortness of breath?: No  Endocrine Excessive thirst?: No  Musculoskeletal Back pain?: No Joint pain?: No  Neurological Headaches?: No Dizziness?: No  Psychologic Depression?: No Anxiety?: No  Physical Exam: BP 125/68   Pulse 62   Ht 5\' 7"  (1.702 m)   Wt 143 lb 14.4 oz (65.3 kg)   BMI 22.54 kg/m   Constitutional: Well nourished. Alert and oriented, No acute distress. HEENT: [place] AT, moist mucus membranes. Trachea midline, no masses. Cardiovascular: No clubbing, cyanosis, or edema. Respiratory: Normal respiratory effort, no increased work of breathing. GI: Abdomen is  soft, non tender, non distended, no abdominal masses. Liver and spleen not palpable.  No hernias appreciated.  Stool sample for occult testing is not indicated.   GU: No CVA tenderness.  No bladder fullness or masses.  Patient with circumcised phallus.   Urethral meatus is patent.  No penile discharge. No penile lesions or rashes. Scrotum without lesions, cysts, rashes and/or edema.  Testicles are located scrotally bilaterally. No masses are appreciated in the testicles. Left and right epididymis are normal. Rectal: Patient with  normal sphincter tone. Anus and perineum without scarring or rashes. No rectal masses are appreciated. Prostate is approximately 45 grams, no nodules are appreciated. Seminal vesicles are normal. Skin: No rashes, bruises or suspicious lesions. Lymph: No cervical or inguinal adenopathy. Neurologic: Grossly intact, no focal deficits, moving all 4 extremities. Psychiatric: Normal mood and affect.   Laboratory Data: Previous PSA's:     0.6 ng/mL on 06/26/2014     0.7 ng/mL on 12/27/2014     0.8 ng/mL on 07/20/2016  Results for orders placed or performed in visit on 01/16/16  PSA  Result Value Ref Range   Prostate Specific Ag, Serum 0.7 0.0 - 4.0 ng/mL   PSA  0.8 ng/mL on 01/25/2017 PSA   0.9 ng/mL on 07/29/2017 PSA  1.0 ng/mL on 01/25/2018 PSA  0.6 ng/mL on 08/03/2018  I have reviewed the labs.   Assessment & Plan  1. BPH with LUTS Continue conservative management, avoiding bladder irritants and timed voiding's RTC in 6 months for IPSS, PSA and exam   2. Family history of prostate cancer:   Patient's father and brother were both diagnosed with prostate cancer in their 39's.  We will continue to monitor every 6 months with PSA and DRE.   Return in about 6 months (around 02/07/2019) for PSA and exam.  [name], PA-C

## 2018-10-26 ENCOUNTER — Ambulatory Visit (INDEPENDENT_AMBULATORY_CARE_PROVIDER_SITE_OTHER): Payer: BLUE CROSS/BLUE SHIELD | Admitting: Physician Assistant

## 2018-10-26 ENCOUNTER — Encounter: Payer: Self-pay | Admitting: Physician Assistant

## 2018-10-26 VITALS — BP 140/86 | HR 66 | Temp 97.9°F | Resp 16 | Wt 151.0 lb

## 2018-10-26 DIAGNOSIS — J40 Bronchitis, not specified as acute or chronic: Secondary | ICD-10-CM | POA: Diagnosis not present

## 2018-10-26 LAB — POCT INFLUENZA A/B
Influenza A, POC: NEGATIVE
Influenza B, POC: NEGATIVE

## 2018-10-26 MED ORDER — ALBUTEROL SULFATE HFA 108 (90 BASE) MCG/ACT IN AERS: 2.0000 | INHALATION_SPRAY | Freq: Four times a day (QID) | RESPIRATORY_TRACT | 2 refills | Status: DC | PRN

## 2018-10-26 MED ORDER — PREDNISONE 10 MG (21) PO TBPK: ORAL_TABLET | ORAL | 0 refills | Status: DC

## 2018-10-26 NOTE — Patient Instructions (Signed)

## 2018-10-26 NOTE — Progress Notes (Signed)
Acute Office Visit  Subjective:    Patient ID: Benjamin Ford, male    DOB: 1961-05-21, 57 y.o.   MRN: [id number]  Chief Complaint  Patient presents with  . URI    HPI   Upper Respiratory Infection: Patient complains of symptoms of a URI. Symptoms include left ear pain. Onset of symptoms was 3 days ago, gradually worsening since that time. He also c/o achiness, congestion, productive cough with  green colored sputum and wheezing for the past 3 days .  He is drinking plenty of fluids. Evaluation to date: none. Treatment to date: antihistamines, cough suppressants and decongestants. Mucinex, and Aleve.  In the interim, he reports he has had a 2nd brother with prostate cancer and his prostate was removed. He continues to be followed by [place] for prostate cancer screening.   Past Medical History:  Diagnosis Date  . Anxiety   . Asthma   . BPH (benign prostatic hyperplasia)   . GERD (gastroesophageal reflux disease)   . HTN (hypertension)   . Insomnia   . Palpitations   . Stomach ulcer   . Testicular mass     Past Surgical History:  Procedure Laterality Date  . INGUINAL HERNIA REPAIR  2008  . MASTECTOMY Bilateral 1992/1981  . VASECTOMY  1983    Family History  Problem Relation Age of Onset  . Prostate cancer Father   . Cancer Father        bone, prostate  . COPD Mother   . Cancer Mother        lung  . Cancer Maternal Grandmother        brain  . Aneurysm Paternal Grandmother   . Prostate cancer Brother   . Prostate cancer Brother   . Kidney disease Neg Hx   . Bladder Cancer Neg Hx     Social History   Socioeconomic History  . Marital status: Married    Spouse name: Not on file  . Number of children: Not on file  . Years of education: Not on file  . Highest education level: Not on file  Occupational History  . Not on file  Social Needs  . Financial resource strain: Not on file  . Food insecurity:    Worry: Not on file   Inability: Not on file  . Transportation needs:    Medical: Not on file    Non-medical: Not on file  Tobacco Use  . Smoking status: Former Research scientist (life sciences)  . Smokeless tobacco: Never Used  . Tobacco comment: smoked <1  ppd off and on from mid 56s to mid 43s.   Substance and Sexual Activity  . Alcohol use: Yes    Alcohol/week: 0.0 standard drinks    Comment: moderate  . Drug use: No  . Sexual activity: Not on file  Lifestyle  . Physical activity:    Days per week: Not on file    Minutes per session: Not on file  . Stress: Not on file  Relationships  . Social connections:    Talks on phone: Not on file    Gets together: Not on file    Attends religious service: Not on file    Active member of club or organization: Not on file    Attends meetings of clubs or organizations: Not on file    Relationship status: Not on file  . Intimate partner violence:    Fear of current or ex partner: Not on file    Emotionally abused: Not  on file    Physically abused: Not on file    Forced sexual activity: Not on file  Other Topics Concern  . Not on file  Social History Narrative  . Not on file    Outpatient Medications Prior to Visit  Medication Sig Dispense Refill  . albuterol (PROVENTIL HFA;VENTOLIN HFA) 108 (90 Base) MCG/ACT inhaler Inhale 2 puffs every 6 (six) hours as needed into the lungs for wheezing or shortness of breath. (Patient not taking: Reported on 02/01/2018) 1 Inhaler 2  . omeprazole (PRILOSEC) 40 MG capsule Take 1 capsule (40 mg total) by mouth daily. (Patient not taking: Reported on 02/01/2018) 30 capsule 1   No facility-administered medications prior to visit.     No Known Allergies  Review of Systems  Constitutional: Positive for fever and malaise/fatigue.  HENT: Positive for congestion.   Respiratory: Positive for cough and wheezing.   Cardiovascular: Negative.        Objective:    Physical Exam  Constitutional: He is oriented to person, place, and time. He appears  well-developed and well-nourished.  Cardiovascular: Normal rate, regular rhythm and normal heart sounds.  Pulmonary/Chest: Effort normal. He has wheezes. He has no rales.  Wheezes on right side.   Neurological: He is alert and oriented to person, place, and time.  Skin: Skin is warm and dry.  Psychiatric: He has a normal mood and affect. His behavior is normal.    BP 140/86 (BP Location: Left Arm, Patient Position: Sitting, Cuff Size: Normal)   Pulse 66   Temp 97.9 F (36.6 C) (Oral)   Resp 16   Wt 151 lb (68.5 kg)   SpO2 98%   BMI 23.65 kg/m  Wt Readings from Last 3 Encounters:  10/26/18 151 lb (68.5 kg)  08/09/18 143 lb 14.4 oz (65.3 kg)  02/01/18 154 lb (69.9 kg)    Health Maintenance Due  Topic Date Due  . Hepatitis C Screening  [birth year]  . HIV Screening  04/21/1975  . TETANUS/TDAP  04/21/1979  . COLONOSCOPY  04/20/2010  . INFLUENZA VACCINE  07/07/2018    There are no preventive care reminders to display for this patient.   No results found for: TSH No results found for: WBC, HGB, HCT, MCV, PLT No results found for: NA, K, CHLORIDE, CO2, GLUCOSE, BUN, CREATININE, BILITOT, ALKPHOS, AST, ALT, PROT, ALBUMIN, CALCIUM, ANIONGAP, EGFR, GFR No results found for: CHOL No results found for: HDL No results found for: LDLCALC No results found for: TRIG No results found for: CHOLHDL No results found for: HGBA1C     Assessment & Plan:  1. Bronchitis  Will treat as below. CXR if worsening, patient to call back.  - predniSONE (STERAPRED UNI-PAK 21 TAB) 10 MG (21) TBPK tablet; Take 6 pills on day 1, 5 pills on day 2, 4 pills on day 3 and so on until complete.  Dispense: 21 tablet; Refill: 0 - albuterol (PROVENTIL HFA;VENTOLIN HFA) 108 (90 Base) MCG/ACT inhaler; Inhale 2 puffs into the lungs every 6 (six) hours as needed for wheezing or shortness of breath.  Dispense: 1 Inhaler; Refill: 2 - POCT Influenza A/B  Return if symptoms worsen or fail to improve.    Trinna Post, PA-C

## 2018-10-31 ENCOUNTER — Telehealth: Payer: Self-pay | Admitting: Family Medicine

## 2018-10-31 ENCOUNTER — Ambulatory Visit
Admission: RE | Admit: 2018-10-31 | Discharge: 2018-10-31 | Disposition: A | Discharge status: Home / Self Care | Payer: BLUE CROSS/BLUE SHIELD | Source: Ambulatory Visit | Attending: Physician Assistant | Admitting: Physician Assistant

## 2018-10-31 ENCOUNTER — Telehealth: Payer: Self-pay

## 2018-10-31 DIAGNOSIS — R05 Cough: Secondary | ICD-10-CM

## 2018-10-31 DIAGNOSIS — R059 Cough, unspecified: Secondary | ICD-10-CM

## 2018-10-31 NOTE — Telephone Encounter (Signed)
Patient reports doing x-ray in the last few minutes.

## 2018-10-31 NOTE — Telephone Encounter (Signed)
Pt called to see if there was an update to the message that was sent this morning. Pt is requesting a call back. Please advise. Thanks TNP

## 2018-10-31 NOTE — Telephone Encounter (Signed)
error 

## 2018-10-31 NOTE — Telephone Encounter (Signed)
Pt is not feeling better from last weeks visit w/ Benjamin Ford.  Pt took his last of the medications today.  Please advise pt.  Thanks, [employer]

## 2018-10-31 NOTE — Telephone Encounter (Signed)
Order for CXR placed, he may get this at outpt imaging. He may need antibiotics if there are findings that look like pneumonia.

## 2018-11-01 ENCOUNTER — Telehealth: Payer: Self-pay

## 2018-11-01 DIAGNOSIS — R05 Cough: Secondary | ICD-10-CM

## 2018-11-01 DIAGNOSIS — R059 Cough, unspecified: Secondary | ICD-10-CM

## 2018-11-01 MED ORDER — PROMETHAZINE-DM 6.25-15 MG/5ML PO SYRP: 5.0000 mL | ORAL_SOLUTION | Freq: Every evening | ORAL | 0 refills | Status: DC | PRN

## 2018-11-01 NOTE — Telephone Encounter (Signed)
Patient states you can send in the phenergan DM to [facility] [address],[place].

## 2018-11-01 NOTE — Addendum Note (Signed)
Addended by: Trey SailorsPOLLAK, ADRIANA M on: 11/01/2018 04:35 PM   Modules accepted: Orders

## 2018-11-01 NOTE — Telephone Encounter (Signed)
His CXR was normal. This can take some time to get over. I can send over phenergan DM which is a cough medication and can help with congestion. It can make him a little sleepy so he should take it at night. He should also be taking mucinex. Does he want to me to send this?

## 2018-11-01 NOTE — Telephone Encounter (Signed)
Patient saw results on mychart  

## 2018-11-01 NOTE — Telephone Encounter (Signed)
I sent that in for him, one teaspoon before bed should help with cough and congestion, can make drowsy do not drive.

## 2018-11-01 NOTE — Telephone Encounter (Signed)
-----   Message from Trey SailorsAdriana M Pollak, [place] JerseyPA-C sent at 11/01/2018  8:41 AM EST ----- CXR is normal with no signs of pneumonia. It can take up to 10 days to feel better from an illness like this and you can have some cough for some time afterwards.

## 2018-11-01 NOTE — Telephone Encounter (Signed)
Patient saw his x-ray results in my chart and wants to know what he is to do next.  CB # [phone]

## 2018-11-02 NOTE — Telephone Encounter (Signed)
This has been addressed, sent in cough medication for him.

## 2018-11-02 NOTE — Telephone Encounter (Signed)
Left message advising of medication.  

## 2019-01-03 ENCOUNTER — Emergency Department
Admission: EM | Admit: 2019-01-03 | Discharge: 2019-01-03 | Disposition: A | Discharge status: Home / Self Care | Payer: Worker's Compensation | Attending: Emergency Medicine | Admitting: Emergency Medicine

## 2019-01-03 ENCOUNTER — Encounter: Payer: Self-pay | Admitting: Emergency Medicine

## 2019-01-03 ENCOUNTER — Ambulatory Visit: Payer: Worker's Compensation

## 2019-01-03 ENCOUNTER — Ambulatory Visit (INDEPENDENT_AMBULATORY_CARE_PROVIDER_SITE_OTHER): Payer: Worker's Compensation

## 2019-01-03 ENCOUNTER — Emergency Department: Payer: Worker's Compensation

## 2019-01-03 ENCOUNTER — Ambulatory Visit (INDEPENDENT_AMBULATORY_CARE_PROVIDER_SITE_OTHER)
Admission: EM | Admit: 2019-01-03 | Discharge: 2019-01-03 | Disposition: A | Discharge status: Other Acute Inpatient Hospital | Payer: Worker's Compensation | Source: Home / Self Care | Attending: Family Medicine | Admitting: Family Medicine

## 2019-01-03 ENCOUNTER — Other Ambulatory Visit: Payer: Self-pay

## 2019-01-03 DIAGNOSIS — S62611A Displaced fracture of proximal phalanx of left index finger, initial encounter for closed fracture: Secondary | ICD-10-CM | POA: Diagnosis not present

## 2019-01-03 DIAGNOSIS — W1800XA Striking against unspecified object with subsequent fall, initial encounter: Secondary | ICD-10-CM

## 2019-01-03 DIAGNOSIS — Z87891 Personal history of nicotine dependence: Secondary | ICD-10-CM | POA: Insufficient documentation

## 2019-01-03 DIAGNOSIS — X500XXA Overexertion from strenuous movement or load, initial encounter: Secondary | ICD-10-CM | POA: Diagnosis not present

## 2019-01-03 DIAGNOSIS — Y999 Unspecified external cause status: Secondary | ICD-10-CM | POA: Insufficient documentation

## 2019-01-03 DIAGNOSIS — W010XXA Fall on same level from slipping, tripping and stumbling without subsequent striking against object, initial encounter: Secondary | ICD-10-CM | POA: Diagnosis not present

## 2019-01-03 DIAGNOSIS — Y9301 Activity, walking, marching and hiking: Secondary | ICD-10-CM | POA: Diagnosis not present

## 2019-01-03 DIAGNOSIS — S6992XA Unspecified injury of left wrist, hand and finger(s), initial encounter: Secondary | ICD-10-CM | POA: Diagnosis present

## 2019-01-03 DIAGNOSIS — J45909 Unspecified asthma, uncomplicated: Secondary | ICD-10-CM | POA: Insufficient documentation

## 2019-01-03 DIAGNOSIS — I10 Essential (primary) hypertension: Secondary | ICD-10-CM | POA: Diagnosis not present

## 2019-01-03 DIAGNOSIS — Y929 Unspecified place or not applicable: Secondary | ICD-10-CM | POA: Insufficient documentation

## 2019-01-03 MED ORDER — HYDROCODONE-ACETAMINOPHEN 5-325 MG PO TABS
1.0000 | ORAL_TABLET | Freq: Once | ORAL | Status: AC
  Administered 2019-01-03: 1 via ORAL
  Filled 2019-01-03: qty 1

## 2019-01-03 MED ORDER — LIDOCAINE HCL (PF) 1 % IJ SOLN
5.0000 mL | Freq: Once | INTRAMUSCULAR | Status: AC
  Administered 2019-01-03: 5 mL via INTRADERMAL
  Filled 2019-01-03: qty 5

## 2019-01-03 MED ORDER — HYDROCODONE-ACETAMINOPHEN 5-325 MG PO TABS: 1.0000 | ORAL_TABLET | Freq: Four times a day (QID) | ORAL | 0 refills | Status: DC | PRN

## 2019-01-03 NOTE — ED Provider Notes (Signed)
[facility] Emergency Department Provider Note  ____________________________________________   First MD Initiated Contact with Patient 01/03/19 1754     (approximate)  I have reviewed the triage vital signs and the nursing notes.   HISTORY  Chief Complaint Finger Injury    HPI Benjamin Ford is a 58 y.o. male since emergency department after being seen at [facility].  Patient states that he was taking the trash out and fell on his left finger.  He states that they told him at the [facility] urgent care that the finger was displaced and they were not comfortable reducing it and felt like he needed to see orthopedics.    Past Medical History:  Diagnosis Date  . Anxiety   . Asthma   . BPH (benign prostatic hyperplasia)   . GERD (gastroesophageal reflux disease)   . HTN (hypertension)   . Insomnia   . Palpitations   . Stomach ulcer   . Testicular mass     Patient Active Problem List   Diagnosis Date Noted  . BPH with obstruction/lower urinary tract symptoms 07/23/2015  . Family history of prostate cancer 07/23/2015  . Breathlessness on exertion 07/19/2015  . 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 05/25/2014  . Asthma with exacerbation 05/27/2010  . Cannot sleep 12/06/2007  . Acid reflux 07/26/2007  . Awareness of heartbeats 04/04/2007  . Anxiety disorder 01/03/2007    Past Surgical History:  Procedure Laterality Date  . INGUINAL HERNIA REPAIR  2008  . MASTECTOMY Bilateral 1992/1981  . VASECTOMY  1983    Prior to Admission medications   Medication Sig Start Date End Date Taking? Authorizing Provider  albuterol (PROVENTIL HFA;VENTOLIN HFA) 108 (90 Base) MCG/ACT inhaler Inhale 2 puffs into the lungs every 6 (six) hours as needed for wheezing or shortness of breath. 10/26/18   Trey Sailors, PA-C  HYDROcodone-acetaminophen (NORCO/VICODIN) 5-325 MG tablet Take 1 tablet by mouth every 6 (six) hours as needed for moderate pain. 01/03/19   Faythe Ghee, PA-C     Allergies Patient has no known allergies.  Family History  Problem Relation Age of Onset  . Prostate cancer Father   . Cancer Father        bone, prostate  . COPD Mother   . Cancer Mother        lung  . Cancer Maternal Grandmother        brain  . Aneurysm Paternal Grandmother   . Prostate cancer Brother   . Prostate cancer Brother   . Kidney disease Neg Hx   . Bladder Cancer Neg Hx     Social History Social History   Tobacco Use  . Smoking status: Former Games developer  . Smokeless tobacco: Never Used  . Tobacco comment: smoked <1  ppd off and on from mid 20s to mid 40s.   Substance Use Topics  . Alcohol use: Yes    Alcohol/week: 0.0 standard drinks    Comment: moderate  . Drug use: No    Review of Systems  Constitutional: No fever/chills Eyes: No visual changes. ENT: No sore throat. Respiratory: Denies cough Genitourinary: Negative for dysuria. Musculoskeletal: Negative for back pain.  Positive for left index finger pain Skin: Negative for rash.    ____________________________________________   PHYSICAL EXAM:  VITAL SIGNS: ED Triage Vitals  Enc Vitals Group     BP 01/03/19 1735 (!) 171/87     Pulse Rate 01/03/19 1735 71     Resp 01/03/19 1735 16     Temp  01/03/19 1735 98.9 F (37.2 C)     Temp Source 01/03/19 1735 Oral     SpO2 01/03/19 1735 100 %     Weight --      Height --      Head Circumference --      Peak Flow --      Pain Score 01/03/19 1734 9     Pain Loc --      Pain Edu? --      Excl. in GC? --     Constitutional: Alert and oriented. Well appearing and in no acute distress. Eyes: Conjunctivae are normal.  Head: Atraumatic. Nose: No congestion/rhinnorhea. Mouth/Throat: Mucous membranes are moist.   Neck:  supple no lymphadenopathy noted Cardiovascular: Normal rate, regular rhythm. Respiratory: Normal respiratory effort.  No retractions, GU: deferred Musculoskeletal: Left index finger shows a deformity.  Neurovascular is intact.    Neurologic:  Normal speech and language.  Skin:  Skin is warm, dry and intact. No rash noted. Psychiatric: Mood and affect are normal. Speech and behavior are normal.  ____________________________________________   LABS (all labs ordered are listed, but only abnormal results are displayed)  Labs Reviewed - No data to display ____________________________________________   ____________________________________________  RADIOLOGY  Reviewed the previous x-ray done [facility], shows a 30 degree angulation of the transverse fracture of the proximal phalanx of the left index finger.  Postreduction x-ray shows better alignment.  ____________________________________________   PROCEDURES  Procedure(s) performed: Permission was given by the patient to have the finger reduced.  Digital block was performed.  1% Xylocaine was used anatomic landmarks were identified.  Anesthesia was achieved.  The finger was reduced with traction.  Alignment per the x-ray looks much better, no deformity is noted at this time.  Patient tolerated procedure well.  Procedures    ____________________________________________   INITIAL IMPRESSION / ASSESSMENT AND PLAN / ED COURSE  Pertinent labs & imaging results that were available during my care of the patient were reviewed by me and considered in my medical decision making (see chart for details).   Patient is 58 year old male presents emergency department after being seen [facility].  He has a fracture with a 30 degree angulation of the left index finger.  Physical exam shows a deformity of the left index finger.  Left index finger was reduced with traction.  See procedure note  Page Dr. Allena KatzPatel.  Dr. Allena KatzPatel states to try a reduction and splint the patient.  He [facility] be [facility] in 1 to 2 days.  Postreduction x-ray of the left index finger shows better alignment  The patient was placed in a OCL splint by  the tech.  Patient was given all instructions on care of the splint and the fracture.  He is to keep the hand elevated.  Workers comp form was filled out saying that he should not use his left hand until released by orthopedics.  He is to check with his Worker's Comp. insurance carrier to make sure they will approve [facility].  If not they are to assign him to a different orthopedist.  He is to keep the splint on until evaluated by orthopedics.  He was given a prescription for Vicodin 5/325 #15 no refill.  He is also to take ibuprofen if needed.  He states he understands and will comply.  He was discharged in stable condition.     As part of my medical decision making,  I reviewed the following data within the electronic MEDICAL RECORD NUMBER History obtained from family, Nursing notes reviewed and incorporated, Old chart reviewed, Radiograph reviewed x-ray of the index finger shows a 30 degree angulation, postreduction shows better alignment, A consult was requested and obtained from this/these consultant(s) Orthopedics, Notes from prior ED visits and [place] Controlled Substance Database  ____________________________________________   FINAL CLINICAL IMPRESSION(S) / ED DIAGNOSES  Final diagnoses:  Closed displaced fracture of proximal phalanx of left index finger, initial encounter      NEW MEDICATIONS STARTED DURING THIS VISIT:  New Prescriptions   HYDROCODONE-ACETAMINOPHEN (NORCO/VICODIN) 5-325 MG TABLET    Take 1 tablet by mouth every 6 (six) hours as needed for moderate pain.     Note:  This document was prepared using Dragon voice recognition software and may include unintentional dictation errors.    Faythe GheeFisher, Susan W, PA-C 01/03/19 1940    Willy Eddyobinson, Patrick, MD 01/03/19 2212

## 2019-01-03 NOTE — ED Triage Notes (Signed)
Patient states that while at work about 40 min ago he slipped on a trash bag and fell and injured his left 2nd finger.

## 2019-01-03 NOTE — Discharge Instructions (Addendum)
Follow-up with orthopedics.  Please call tomorrow for an appointment.  Check with your Worker's Comp. insurance carrier to make sure they will approve [facility].  If not they should assign you to a different orthopedist.  Wear the splint until seen by orthopedics.  Take the pain medication as needed.  You may also take ibuprofen.  Return to the emergency department if worsening.  Try to keep the hand elevated above your heart as this will decrease the pain.

## 2019-01-03 NOTE — ED Notes (Signed)
Urine Drug screen collected per policy and placed in [facility] to be picked up.

## 2019-01-03 NOTE — ED Provider Notes (Signed)
[facility]    CSN: [id number] Arrival date & time: 01/03/19  1439  History   Chief Complaint Chief Complaint  Patient presents with  . Hand Pain    left 2nd finger W/C  . Fall   HPI   59 year old male presents with the above complaint.  Patient was at work and was taking out the trash.  As he was throwing the trash in the dumpster, he had stepped on the trash bag.  It got caught and he subsequently fell.  He states that he landed on his left hand which was close to his chest wall.  He states that he injured his left index finger.  Pain is severe.  Worse with range of motion.  No relieving factors.  No other associated symptoms.  No other complaints.  PMH, Surgical Hx, Family Hx, Social History reviewed and updated as below.  Past Medical History:  Diagnosis Date  . Anxiety   . Asthma   . BPH (benign prostatic hyperplasia)   . GERD (gastroesophageal reflux disease)   . HTN (hypertension)   . Insomnia   . Palpitations   . Stomach ulcer   . Testicular mass    Patient Active Problem List   Diagnosis Date Noted  . BPH with obstruction/lower urinary tract symptoms 07/23/2015  . Family history of prostate cancer 07/23/2015  . Breathlessness on exertion 07/19/2015  . 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 05/25/2014  . Asthma with exacerbation 05/27/2010  . Cannot sleep 12/06/2007  . Acid reflux 07/26/2007  . Awareness of heartbeats 04/04/2007  . Anxiety disorder 01/03/2007   Past Surgical History:  Procedure Laterality Date  . INGUINAL HERNIA REPAIR  2008  . MASTECTOMY Bilateral 1992/1981  . VASECTOMY  1983   Home Medications    Prior to Admission medications   Medication Sig Start Date End Date Taking? Authorizing Provider  albuterol (PROVENTIL HFA;VENTOLIN HFA) 108 (90 Base) MCG/ACT inhaler Inhale 2 puffs into the lungs every 6 (six) hours as needed for wheezing or shortness of breath. 10/26/18  Yes Trey Sailors, PA-C    Family History Family History  Problem  Relation Age of Onset  . Prostate cancer Father   . Cancer Father        bone, prostate  . COPD Mother   . Cancer Mother        lung  . Cancer Maternal Grandmother        brain  . Aneurysm Paternal Grandmother   . Prostate cancer Brother   . Prostate cancer Brother   . Kidney disease Neg Hx   . Bladder Cancer Neg Hx     Social History Social History   Tobacco Use  . Smoking status: Former Games developer  . Smokeless tobacco: Never Used  . Tobacco comment: smoked <1  ppd off and on from mid 20s to mid 40s.   Substance Use Topics  . Alcohol use: Yes    Alcohol/week: 0.0 standard drinks    Comment: moderate  . Drug use: No     Allergies   Patient has no known allergies.   Review of Systems Review of Systems  Constitutional: Negative.   Musculoskeletal:       Finger/hand injury.    Physical Exam Triage Vital Signs ED Triage Vitals  Enc Vitals Group     BP 01/03/19 1507 (!) 157/87     Pulse Rate 01/03/19 1507 65     Resp 01/03/19 1507 16     Temp 01/03/19 1507 98.1 F (  36.7 C)     Temp Source 01/03/19 1507 Oral     SpO2 01/03/19 1507 100 %     Weight 01/03/19 1505 151 lb (68.5 kg)     Height 01/03/19 1505 5\' 6"  (1.676 m)     Head Circumference --      Peak Flow --      Pain Score 01/03/19 1505 6     Pain Loc --      Pain Edu? --      Excl. in GC? --    Updated Vital Signs BP (!) 157/87 (BP Location: Right Arm)   Pulse 65   Temp 98.1 F (36.7 C) (Oral)   Resp 16   Ht 5\' 6"  (1.676 m)   Wt 68.5 kg   SpO2 100%   BMI 24.37 kg/m   Visual Acuity Right Eye Distance:   Left Eye Distance:   Bilateral Distance:    Right Eye Near:   Left Eye Near:    Bilateral Near:     Physical Exam Vitals signs and nursing note reviewed.  Constitutional:      General: He is not in acute distress. HENT:     Head: Normocephalic and atraumatic.     Nose: Nose normal.  Eyes:     General: No scleral icterus.    Conjunctiva/sclera: Conjunctivae normal.  Pulmonary:      Effort: Pulmonary effort is normal. No respiratory distress.  Musculoskeletal:     Comments: Left hand -patient with severe tenderness and bruising over the MCP of the index finger on the palmar aspect.  Associated bruising.  Neurovascularly intact distally.  Skin:    General: Skin is warm.     Findings: Bruising present.  Neurological:     Mental Status: He is alert.  Psychiatric:        Mood and Affect: Mood normal.        Behavior: Behavior normal.    UC Treatments / Results  Labs (all labs ordered are listed, but only abnormal results are displayed) Labs Reviewed - No data to display  EKG None  Radiology Dg Hand Complete Left  Result Date: 01/03/2019 CLINICAL DATA:  Pt states he slipped on trash bag at work today and fell trying to catch himself. Obvious deformity left index finger proximal phalanx area with pain at 3rd MCP joint area radiating into base of 5th and up into pinky EXAM: LEFT HAND - COMPLETE 3+ VIEW COMPARISON:  None. FINDINGS: There is a fracture at the base of the proximal phalanx of the left index finger. Fracture is transverse, without involvement of the articular surface. No fracture comminution. Fracture is mildly displaced between 3 and 4 mm in a volar/palmar direction. The fracture is angulated, distal component angulated dorsally by 30 degrees. No other fractures.  No bone lesions. Joints are normally spaced and aligned there is a small density that appears metallic within the soft tissues of the mid left fifth finger, likely chronic. There is soft tissue swelling of the proximal index finger. IMPRESSION: 1. Transverse fracture across the base of the proximal phalanx of the left index finger, minimally displaced with 30 degrees of dorsal angulation. 2. No other fractures.  No dislocation. Electronically Signed   By: Amie Portlandavid  Ormond M.D.   On: 01/03/2019 16:57   Procedures Procedures (including critical care time)  Medications Ordered in UC Medications - No data  to display  Initial Impression / Assessment and Plan / UC Course  I have reviewed the triage  vital signs and the nursing notes.  Pertinent labs & imaging results that were available during my care of the patient were reviewed by me and considered in my medical decision making (see chart for details).    58 year old male presents following a fall/injury.  X-ray was taken and revealed a transverse fracture of the base of the proximal phalanx with minimal displacement and 30 degrees of dorsal angulation.  Patient sent to the ER for further management of his fracture.  Likely will need surgery.  Final Clinical Impressions(s) / UC Diagnoses   Final diagnoses:  Closed displaced fracture of proximal phalanx of left index finger, initial encounter     Discharge Instructions     Go to the ER.  Best of luck.  Take care  Dr. Adriana Simasook    ED Prescriptions    None     Controlled Substance Prescriptions [place] Controlled Substance Registry consulted? Not Applicable   Tommie SamsCook, Tkeyah Burkman G, DO 01/03/19 1711

## 2019-01-03 NOTE — ED Triage Notes (Addendum)
Slipped while taking the trash out, left index finger bruised with deformity.  Seen initially through [facility]. XRay's done.

## 2019-01-03 NOTE — Discharge Instructions (Signed)
Go to the ER.  Best of luck.  Take care  Dr. Adriana Simas

## 2019-02-07 ENCOUNTER — Other Ambulatory Visit: Payer: BLUE CROSS/BLUE SHIELD

## 2019-02-09 ENCOUNTER — Ambulatory Visit: Payer: BLUE CROSS/BLUE SHIELD | Admitting: Urology

## 2019-03-03 ENCOUNTER — Other Ambulatory Visit: Payer: Self-pay | Admitting: Radiology

## 2019-03-03 DIAGNOSIS — Z8042 Family history of malignant neoplasm of prostate: Secondary | ICD-10-CM

## 2019-03-13 ENCOUNTER — Other Ambulatory Visit: Payer: Self-pay

## 2019-03-15 ENCOUNTER — Ambulatory Visit: Payer: Self-pay | Admitting: Urology

## 2019-05-09 ENCOUNTER — Ambulatory Visit: Payer: Self-pay | Admitting: Urology

## 2019-06-05 ENCOUNTER — Other Ambulatory Visit: Payer: Self-pay

## 2019-06-05 ENCOUNTER — Other Ambulatory Visit
Admission: RE | Admit: 2019-06-05 | Discharge: 2019-06-05 | Disposition: A | Discharge status: Home / Self Care | Payer: Managed Care, Other (non HMO) | Attending: Urology | Admitting: Urology

## 2019-06-05 DIAGNOSIS — Z8042 Family history of malignant neoplasm of prostate: Secondary | ICD-10-CM

## 2019-06-05 LAB — PSA: Prostatic Specific Antigen: 0.75 ng/mL (ref 0.00–4.00)

## 2019-06-06 NOTE — Progress Notes (Signed)
2:51 PM   Kinsler Soeder 1961/02/12 [id number]  Referring provider: Birdie Sons, MD [address] [place],  [place] [address]  Chief Complaint  Patient presents with  . Benign Prostatic Hypertrophy    HPI: [name] is a 58 year old Caucasian male with a strong family history of PCa, with his brother and father both being diagnosed with PCa, and BPH with LUTS who presents today for a 6 month follow up.    His father was diagnosed at the age of 14 with PCa and his brother at the age of 28.  His brother recently underwent a prostatectomy.    His other brother has been followed for a rising PSA and now has been diagnosed with prostate cancer.  He underwent a prostatectomy as well.     He has no complaints at this visit.  Patient denies any gross hematuria, dysuria or suprapubic/flank pain.  Patient denies any fevers, chills, nausea or vomiting.   PMH: Past Medical History:  Diagnosis Date  . Anxiety   . Asthma   . BPH (benign prostatic hyperplasia)   . GERD (gastroesophageal reflux disease)   . HTN (hypertension)   . Insomnia   . Palpitations   . Stomach ulcer   . Testicular mass     Surgical History: Past Surgical History:  Procedure Laterality Date  . INGUINAL HERNIA REPAIR  2008  . MASTECTOMY Bilateral 1992/1981  . VASECTOMY  1983    Home Medications:  Allergies as of 06/07/2019   No Known Allergies     Medication List       Accurate as of June 07, 2019  2:51 PM. If you have any questions, ask your nurse or doctor.        albuterol 108 (90 Base) MCG/ACT inhaler Commonly known as: VENTOLIN HFA Inhale 2 puffs into the lungs every 6 (six) hours as needed for wheezing or shortness of breath.   HYDROcodone-acetaminophen 5-325 MG tablet Commonly known as: NORCO/VICODIN Take 1 tablet by mouth every 6 (six) hours as needed for moderate pain.       Allergies: No Known Allergies  Family History: Family History  Problem Relation Age of  Onset  . Prostate cancer Father   . Cancer Father        bone, prostate  . COPD Mother   . Cancer Mother        lung  . Cancer Maternal Grandmother        brain  . Aneurysm Paternal Grandmother   . Prostate cancer Brother   . Prostate cancer Brother   . Kidney disease Neg Hx   . Bladder Cancer Neg Hx     Social History:  reports that he has quit smoking. He has never used smokeless tobacco. He reports current alcohol use. He reports that he does not use drugs.  ROS: UROLOGY Frequent Urination?: No Hard to postpone urination?: No Burning/pain with urination?: No Get up at night to urinate?: No Leakage of urine?: No Urine stream starts and stops?: No Trouble starting stream?: No Do you have to strain to urinate?: No Blood in urine?: No Urinary tract infection?: No Sexually transmitted disease?: No Injury to kidneys or bladder?: No Painful intercourse?: No Weak stream?: No Erection problems?: No Penile pain?: No  Gastrointestinal Nausea?: No Vomiting?: No Indigestion/heartburn?: No Diarrhea?: No Constipation?: No  Constitutional Fever: No Night sweats?: No Weight loss?: No Fatigue?: No  Skin Skin rash/lesions?: No Itching?: No  Eyes Blurred  vision?: No Double vision?: No  Ears/Nose/Throat Sore throat?: No Sinus problems?: No  Hematologic/Lymphatic Swollen glands?: No Easy bruising?: No  Cardiovascular Leg swelling?: No Chest pain?: No  Respiratory Cough?: No Shortness of breath?: No  Endocrine Excessive thirst?: No  Musculoskeletal Back pain?: No Joint pain?: No  Neurological Headaches?: No Dizziness?: No  Psychologic Depression?: No Anxiety?: No  Physical Exam: BP (!) 153/85 (BP Location: Left Arm, Patient Position: Sitting, Cuff Size: Normal)   Pulse 61   Ht 5\' 6"  (1.676 m)   Wt 153 lb (69.4 kg)   BMI 24.69 kg/m   Constitutional:  Well nourished. Alert and oriented, No acute distress. HEENT: [place] AT, moist mucus membranes.   Trachea midline, no masses. Cardiovascular: No clubbing, cyanosis, or edema. Respiratory: Normal respiratory effort, no increased work of breathing. GI: Abdomen is soft, non tender, non distended, no abdominal masses. Liver and spleen not palpable.  No hernias appreciated.  Stool sample for occult testing is not indicated.   GU: No CVA tenderness.  No bladder fullness or masses.  Patient with circumcised phallus.  Urethral meatus is patent.  No penile discharge. No penile lesions or rashes. Scrotum without lesions, cysts, rashes and/or edema.  Testicles are located scrotally bilaterally. No masses are appreciated in the testicles. Left and right epididymis are normal. Rectal: Patient with  normal sphincter tone. Anus and perineum without scarring or rashes. No rectal masses are appreciated. Prostate is approximately 45 grams, no nodules are appreciated.  Skin: No rashes, bruises or suspicious lesions. Lymph: No inguinal adenopathy. Neurologic: Grossly intact, no focal deficits, moving all 4 extremities. Psychiatric: Normal mood and affect.   Laboratory Data: Previous PSA's:     0.6 ng/mL on 06/26/2014     0.7 ng/mL on 12/27/2014     0.8 ng/mL on 07/20/2016  Results for orders placed or performed in visit on 01/16/16  PSA  Result Value Ref Range   Prostate Specific Ag, Serum 0.7 0.0 - 4.0 ng/mL   PSA  0.8 ng/mL on 01/25/2017 PSA   0.9 ng/mL on 07/29/2017 PSA  1.0 ng/mL on 01/25/2018 PSA  0.6 ng/mL on 08/03/2018  PSA  0.75 in 05/2019 I have reviewed the labs.   Assessment & Plan  1. BPH with LUTS Continue conservative management, avoiding bladder irritants and timed voiding's RTC in 6 months for IPSS, PSA and exam   2. Family history of prostate cancer:   Patient's father and now two brothers were both diagnosed with prostate cancer in their 3750's.  Encouraged him to undergo genetic testing.  He will contact his brother to see if he is eligible for free testing.  We will continue to  monitor every 6 months with PSA and DRE.   Return in about 6 months (around 12/08/2019) for IPSS, PSA and exam.  Antinio Sanderfer, PA-C

## 2019-06-07 ENCOUNTER — Encounter: Payer: Self-pay | Admitting: Urology

## 2019-06-07 ENCOUNTER — Ambulatory Visit (INDEPENDENT_AMBULATORY_CARE_PROVIDER_SITE_OTHER): Payer: Managed Care, Other (non HMO) | Admitting: Urology

## 2019-06-07 ENCOUNTER — Other Ambulatory Visit: Payer: Self-pay

## 2019-06-07 VITALS — BP 153/85 | HR 61 | Ht 66.0 in | Wt 153.0 lb

## 2019-06-07 DIAGNOSIS — N138 Other obstructive and reflux uropathy: Secondary | ICD-10-CM | POA: Diagnosis not present

## 2019-06-07 DIAGNOSIS — N401 Enlarged prostate with lower urinary tract symptoms: Secondary | ICD-10-CM | POA: Diagnosis not present

## 2019-06-07 DIAGNOSIS — Z8042 Family history of malignant neoplasm of prostate: Secondary | ICD-10-CM

## 2019-08-24 ENCOUNTER — Other Ambulatory Visit: Payer: Self-pay

## 2019-08-24 ENCOUNTER — Encounter: Payer: Self-pay | Admitting: Physician Assistant

## 2019-08-24 ENCOUNTER — Ambulatory Visit (INDEPENDENT_AMBULATORY_CARE_PROVIDER_SITE_OTHER): Payer: Managed Care, Other (non HMO) | Admitting: Physician Assistant

## 2019-08-24 VITALS — BP 146/96 | HR 83 | Temp 97.1°F | Resp 16 | Wt 168.8 lb

## 2019-08-24 DIAGNOSIS — L309 Dermatitis, unspecified: Secondary | ICD-10-CM

## 2019-08-24 MED ORDER — TRIAMCINOLONE ACETONIDE 0.1 % EX CREA: 1.0000 "application " | TOPICAL_CREAM | Freq: Two times a day (BID) | CUTANEOUS | 0 refills | Status: DC

## 2019-08-24 MED ORDER — FAMOTIDINE 20 MG PO TABS: 20.0000 mg | ORAL_TABLET | Freq: Two times a day (BID) | ORAL | 0 refills | Status: DC

## 2019-08-24 NOTE — Patient Instructions (Signed)
Benadryl: 50 mg at night before bed Pepcid    Atopic Dermatitis Atopic dermatitis is a skin disorder that causes inflammation of the skin. This is the most common type of eczema. Eczema is a group of skin conditions that cause the skin to be itchy, red, and swollen. This condition is generally worse during the cooler winter months and often improves during the warm summer months. Symptoms can vary from person to person. Atopic dermatitis usually starts showing signs in infancy and can last through adulthood. This condition cannot be passed from one person to another (non-contagious), but it is more common in families. Atopic dermatitis may not always be present. When it is present, it is called a flare-up. What are the causes? The exact cause of this condition is not known. Flare-ups of the condition may be triggered by:  Contact with something that you are sensitive or allergic to.  Stress.  Certain foods.  Extremely hot or cold weather.  Harsh chemicals and soaps.  Dry air.  Chlorine. What increases the risk? This condition is more likely to develop in people who have a personal history or family history of eczema, allergies, asthma, or hay fever. What are the signs or symptoms? Symptoms of this condition include:  Dry, scaly skin.  Red, itchy rash.  Itchiness, which can be severe. This may occur before the skin rash. This can make sleeping difficult.  Skin thickening and cracking that can occur over time. How is this diagnosed? This condition is diagnosed based on your symptoms, a medical history, and a physical exam. How is this treated? There is no cure for this condition, but symptoms can usually be controlled. Treatment focuses on:  Controlling the itchiness and scratching. You may be given medicines, such as antihistamines or steroid creams.  Limiting exposure to things that you are sensitive or allergic to (allergens).  Recognizing situations that cause stress  and developing a plan to manage stress. If your atopic dermatitis does not get better with medicines, or if it is all over your body (widespread), a treatment using a specific type of light (phototherapy) may be used. Follow these instructions at home: Skin care   Keep your skin well-moisturized. Doing this seals in moisture and helps to prevent dryness. ? Use unscented lotions that have petroleum in them. ? Avoid lotions that contain alcohol or water. They can dry the skin.  Keep baths or showers short (less than 5 minutes) in warm water. Do not use hot water. ? Use mild, unscented cleansers for bathing. Avoid soap and bubble bath. ? Apply a moisturizer to your skin right after a bath or shower.  Do not apply anything to your skin without checking with your health care provider. General instructions  Dress in clothes made of cotton or cotton blends. Dress lightly because heat increases itchiness.  When washing your clothes, rinse your clothes twice so all of the soap is removed.  Avoid any triggers that can cause a flare-up.  Try to manage your stress.  Keep your fingernails cut short.  Avoid scratching. Scratching makes the rash and itchiness worse. It may also result in a skin infection (impetigo) due to a break in the skin caused by scratching.  Take or apply over-the-counter and prescription medicines only as told by your health care provider.  Keep all follow-up visits as told by your health care provider. This is important.  Do not be around people who have cold sores or fever blisters. If you get the infection,  it may cause your atopic dermatitis to worsen. Contact a health care provider if:  Your itchiness interferes with sleep.  Your rash gets worse or it is not better within one week of starting treatment.  You have a fever.  You have a rash flare-up after having contact with someone who has cold sores or fever blisters. Get help right away if:  You develop pus  or soft yellow scabs in the rash area. Summary  This condition causes a red rash and itchy, dry, scaly skin.  Treatment focuses on controlling the itchiness and scratching, limiting exposure to things that you are sensitive or allergic to (allergens), recognizing situations that cause stress, and developing a plan to manage stress.  Keep your skin well-moisturized.  Keep baths or showers shorter than 5 minutes and use warm water. Do not use hot water. This information is not intended to replace advice given to you by your health care provider. Make sure you discuss any questions you have with your health care provider. Document Released: 11/20/2000 Document Revised: 03/14/2019 Document Reviewed: 12/25/2016 Elsevier Patient Education  2020 [employer].

## 2019-08-24 NOTE — Progress Notes (Signed)
Patient: Benjamin Ford Male    DOB: 1961-01-23   58 y.o.   MRN: [id number] Visit Date: 08/24/2019  Today's Provider: Trinna Post, PA-C   Chief Complaint  Patient presents with  . Rash   Subjective:     HPI Patient here today C/O recurrent rash on chest and left arm. Patient was seen at Urgent Care about two weeks ago, and was treated with prednisone as well as a steroid injection. He reports the rash on his left arm has subsided but it looked different from the rash on his chest, which has persisted. Patient denies any other symptom. No new soaps, colognes. No sun exposure in the area, he normally wears a high collared t-shirt. Got better slightly on prednisone. Patient reports he also took some Benadryl at night while he was taking prednisone because he finds it difficult to sleep. Has tried calamine lotion, tenactin, vagisil, alcohol wipes, topical benadryl. It continues to intensely itch. Denies fevers, chills, nausea, vomiting.   No Known Allergies   Current Outpatient Medications:  .  albuterol (PROVENTIL HFA;VENTOLIN HFA) 108 (90 Base) MCG/ACT inhaler, Inhale 2 puffs into the lungs every 6 (six) hours as needed for wheezing or shortness of breath., Disp: 1 Inhaler, Rfl: 2  Review of Systems  Constitutional: Negative.   Cardiovascular: Negative.   Skin: Positive for rash.    Social History   Tobacco Use  . Smoking status: Former Research scientist (life sciences)  . Smokeless tobacco: Never Used  . Tobacco comment: smoked <1  ppd off and on from mid 42s to mid 21s.   Substance Use Topics  . Alcohol use: Yes    Alcohol/week: 0.0 standard drinks    Comment: moderate      Objective:   BP (!) 146/96 (BP Location: Left Arm, Patient Position: Sitting, Cuff Size: Normal)   Pulse 83   Temp (!) 97.1 F (36.2 C) (Temporal)   Resp 16   Wt 168 lb 12.8 oz (76.6 kg)   SpO2 98%   BMI 27.25 kg/m  Vitals:   08/24/19 0918  BP: (!) 146/96  Pulse: 83  Resp: 16  Temp: (!) 97.1 F (36.2  C)  TempSrc: Temporal  SpO2: 98%  Weight: 168 lb 12.8 oz (76.6 kg)  Body mass index is 27.25 kg/m.   Physical Exam Constitutional:      Appearance: Normal appearance.  Skin:    General: Skin is warm and dry.     Findings: Erythema and rash present.       Neurological:     Mental Status: He is alert and oriented to person, place, and time. Mental status is at baseline.  Psychiatric:        Mood and Affect: Mood normal.        Behavior: Behavior normal.      No results found for any visits on 08/24/19.     Assessment & Plan    1. Dermatitis  Appears to be contact or allergic reaction. Will do twice daily steroid cream, nightly benadryl and H2RA.   - triamcinolone cream (KENALOG) 0.1 %; Apply 1 application topically 2 (two) times daily.  Dispense: 30 g; Refill: 0 - famotidine (PEPCID) 20 MG tablet; Take 1 tablet (20 mg total) by mouth 2 (two) times daily.  Dispense: 90 tablet; Refill: 0  The entirety of the information documented in the History of Present Illness, Review of Systems and Physical Exam were personally obtained by me. Portions of this information  were initially documented by Rondel BatonSulibeya Dimas, CMA and reviewed by me for thoroughness and accuracy.   F/u PRN     Trey SailorsAdriana M Tammie Ellsworth, PA-C  [facility]

## 2019-10-01 ENCOUNTER — Other Ambulatory Visit: Payer: Self-pay | Admitting: Physician Assistant

## 2019-10-01 DIAGNOSIS — L309 Dermatitis, unspecified: Secondary | ICD-10-CM

## 2019-10-02 NOTE — Telephone Encounter (Signed)
L.O.V. was on 08/24/2019, please advise. 

## 2019-12-06 ENCOUNTER — Other Ambulatory Visit: Payer: Self-pay

## 2019-12-06 DIAGNOSIS — N138 Other obstructive and reflux uropathy: Secondary | ICD-10-CM

## 2019-12-06 DIAGNOSIS — N401 Enlarged prostate with lower urinary tract symptoms: Secondary | ICD-10-CM

## 2019-12-07 ENCOUNTER — Other Ambulatory Visit
Admission: RE | Admit: 2019-12-07 | Discharge: 2019-12-07 | Disposition: A | Discharge status: Home / Self Care | Payer: Managed Care, Other (non HMO) | Attending: Urology | Admitting: Urology

## 2019-12-07 ENCOUNTER — Other Ambulatory Visit: Payer: Self-pay

## 2019-12-07 DIAGNOSIS — N401 Enlarged prostate with lower urinary tract symptoms: Secondary | ICD-10-CM | POA: Insufficient documentation

## 2019-12-07 DIAGNOSIS — N138 Other obstructive and reflux uropathy: Secondary | ICD-10-CM | POA: Diagnosis present

## 2019-12-07 LAB — PSA: Prostatic Specific Antigen: 0.65 ng/mL (ref 0.00–4.00)

## 2019-12-10 NOTE — Progress Notes (Signed)
3:39 PM   Philippe Gang Nov 03, 1961 [id number]  Referring provider: Malva Limes, MD [address] [place],  [place] [address]  Chief Complaint  Patient presents with  . Benign Prostatic Hypertrophy    HPI: Mr. Daily is a 59 year old Caucasian male with a strong family history of PCa, with his brother and father both being diagnosed with PCa, and BPH with LUTS who presents today for a 6 month follow up.    His father was diagnosed at the age of 59 with PCa and his brother at the age of 3.  His brother recently underwent a prostatectomy.    His other brother has been followed for a rising PSA and now has been diagnosed with prostate cancer.  He underwent a prostatectomy as well.     He has no complaints at this visit.  Patient denies any gross hematuria, dysuria or suprapubic/flank pain.  Patient denies any fevers, chills, nausea or vomiting.   PMH: Past Medical History:  Diagnosis Date  . Anxiety   . Asthma   . BPH (benign prostatic hyperplasia)   . GERD (gastroesophageal reflux disease)   . HTN (hypertension)   . Insomnia   . Palpitations   . Stomach ulcer   . Testicular mass     Surgical History: Past Surgical History:  Procedure Laterality Date  . INGUINAL HERNIA REPAIR  2008  . MASTECTOMY Bilateral 1992/1981  . VASECTOMY  1983    Home Medications:  Allergies as of 12/11/2019   No Known Allergies     Medication List       Accurate as of December 11, 2019  3:39 PM. If you have any questions, ask your nurse or doctor.        albuterol 108 (90 Base) MCG/ACT inhaler Commonly known as: VENTOLIN HFA Inhale 2 puffs into the lungs every 6 (six) hours as needed for wheezing or shortness of breath.   famotidine 20 MG tablet Commonly known as: PEPCID TAKE 1 TABLET BY MOUTH TWICE A DAY   triamcinolone cream 0.1 % Commonly known as: KENALOG Apply 1 application topically 2 (two) times daily.       Allergies: No Known Allergies  Family  History: Family History  Problem Relation Age of Onset  . Prostate cancer Father   . Cancer Father        bone, prostate  . COPD Mother   . Cancer Mother        lung  . Cancer Maternal Grandmother        brain  . Aneurysm Paternal Grandmother   . Prostate cancer Brother   . Prostate cancer Brother   . Kidney disease Neg Hx   . Bladder Cancer Neg Hx     Social History:  reports that he has quit smoking. He has never used smokeless tobacco. He reports current alcohol use. He reports that he does not use drugs.  ROS: UROLOGY Frequent Urination?: No Hard to postpone urination?: No Burning/pain with urination?: No Get up at night to urinate?: No Leakage of urine?: No Urine stream starts and stops?: No Trouble starting stream?: No Do you have to strain to urinate?: No Blood in urine?: No Urinary tract infection?: No Sexually transmitted disease?: No Injury to kidneys or bladder?: No Painful intercourse?: No Weak stream?: No Erection problems?: No Penile pain?: No  Gastrointestinal Nausea?: No Vomiting?: No Indigestion/heartburn?: No Diarrhea?: No Constipation?: No  Constitutional Fever: No Night sweats?: No Weight loss?: No  Fatigue?: No  Skin Skin rash/lesions?: No Itching?: No  Eyes Blurred vision?: No Double vision?: No  Ears/Nose/Throat Sore throat?: No Sinus problems?: No  Hematologic/Lymphatic Swollen glands?: No Easy bruising?: No  Cardiovascular Leg swelling?: No Chest pain?: No  Respiratory Cough?: No Shortness of breath?: No  Endocrine Excessive thirst?: No  Musculoskeletal Back pain?: No Joint pain?: No  Neurological Headaches?: No Dizziness?: No  Psychologic Depression?: No Anxiety?: No  Physical Exam: BP (!) 173/100   Pulse 67   Ht 5\' 6"  (1.676 m)   Wt 170 lb (77.1 kg)   BMI 27.44 kg/m   Constitutional:  Well nourished. Alert and oriented, No acute distress. HEENT: [place] AT, moist mucus membranes.  Trachea midline,  no masses. Cardiovascular: No clubbing, cyanosis, or edema. Respiratory: Normal respiratory effort, no increased work of breathing. GI: Abdomen is soft, non tender, non distended, no abdominal masses. Liver and spleen not palpable.  No hernias appreciated.  Stool sample for occult testing is not indicated.   GU: No CVA tenderness.  No bladder fullness or masses.  Patient with circumcised phallus.  Urethral meatus is patent.  No penile discharge. No penile lesions or rashes. Scrotum without lesions, cysts, rashes and/or edema.  Testicles are located scrotally bilaterally. No masses are appreciated in the testicles. Left and right epididymis are normal. Rectal: Patient with  normal sphincter tone. Anus and perineum without scarring or rashes. No rectal masses are appreciated. Prostate is approximately 45 grams, no nodules are appreciated. Seminal vesicles could not be palpated.   Skin: No rashes, bruises or suspicious lesions. Lymph: No inguinal adenopathy. Neurologic: Grossly intact, no focal deficits, moving all 4 extremities. Psychiatric: Normal mood and affect.   Laboratory Data: Previous PSA's:     0.6 ng/mL on 06/26/2014     0.7 ng/mL on 12/27/2014     0.8 ng/mL on 07/20/2016  Results for orders placed or performed in visit on 01/16/16  PSA  Result Value Ref Range   Prostate Specific Ag, Serum 0.7 0.0 - 4.0 ng/mL   PSA  0.8 ng/mL on 01/25/2017 PSA   0.9 ng/mL on 07/29/2017 PSA  1.0 ng/mL on 01/25/2018 PSA  0.6 ng/mL on 08/03/2018   Component     Latest Ref Rng & Units 06/05/2019 12/07/2019  Prostatic Specific Antigen     0.00 - 4.00 ng/mL 0.75 0.65   I have reviewed the labs.   Assessment & Plan  1. BPH with LUTS Continue conservative management, avoiding bladder irritants and timed voiding's RTC in 6 months for IPSS, PSA and exam   2. Family history of prostate cancer:   Patient's father and now two brothers were both diagnosed with prostate cancer in their 65's.   We  will continue to monitor every 6 months with PSA and DRE.   Return in about 6 months (around 06/09/2020) for IPSS, PSA and exam.  Jawara Latorre, PA-C

## 2019-12-11 ENCOUNTER — Other Ambulatory Visit: Payer: Self-pay

## 2019-12-11 ENCOUNTER — Encounter: Payer: Self-pay | Admitting: Urology

## 2019-12-11 ENCOUNTER — Ambulatory Visit: Payer: Managed Care, Other (non HMO) | Admitting: Urology

## 2019-12-11 VITALS — BP 173/100 | HR 67 | Ht 66.0 in | Wt 170.0 lb

## 2019-12-11 DIAGNOSIS — N138 Other obstructive and reflux uropathy: Secondary | ICD-10-CM

## 2019-12-11 DIAGNOSIS — Z8042 Family history of malignant neoplasm of prostate: Secondary | ICD-10-CM | POA: Diagnosis not present

## 2019-12-11 DIAGNOSIS — N401 Enlarged prostate with lower urinary tract symptoms: Secondary | ICD-10-CM | POA: Diagnosis not present

## 2020-05-07 ENCOUNTER — Other Ambulatory Visit: Payer: Self-pay

## 2020-05-07 ENCOUNTER — Encounter: Payer: Self-pay | Admitting: Physician Assistant

## 2020-05-07 ENCOUNTER — Ambulatory Visit: Payer: Self-pay | Admitting: *Deleted

## 2020-05-07 ENCOUNTER — Ambulatory Visit (INDEPENDENT_AMBULATORY_CARE_PROVIDER_SITE_OTHER): Payer: Managed Care, Other (non HMO) | Admitting: Physician Assistant

## 2020-05-07 VITALS — BP 126/88 | HR 72 | Temp 97.1°F

## 2020-05-07 DIAGNOSIS — Z1211 Encounter for screening for malignant neoplasm of colon: Secondary | ICD-10-CM

## 2020-05-07 DIAGNOSIS — S39012A Strain of muscle, fascia and tendon of lower back, initial encounter: Secondary | ICD-10-CM | POA: Diagnosis not present

## 2020-05-07 MED ORDER — PREDNISONE 10 MG PO TABS: ORAL_TABLET | ORAL | 0 refills | Status: DC

## 2020-05-07 MED ORDER — CYCLOBENZAPRINE HCL 5 MG PO TABS: 5.0000 mg | ORAL_TABLET | Freq: Three times a day (TID) | ORAL | 0 refills | Status: DC | PRN

## 2020-05-07 NOTE — Telephone Encounter (Signed)
Pt and his wife called stating he is muscle spasms in his lower back; he states he was shoveling mulch on 05/06/20, an distarted having spasms around 0930; he states his pain is 7 out of 10; the pt also says when he takes a deep breath, it triggers a spasm; he says he can breath on a normal level; the pt says the pain is in his lower back, and worsens anytime he moves his hips or lower back; recommendations made per nurse triage protocol;he requests to be seen by Osvaldo Angst; decision tree completed; pt offered and accepted appt with Osvaldo Angst, 05/07/20 at 1040; he verbalized understanding; will route to office for notification of upcoming appt. Reason for Disposition . [1] SEVERE back pain (e.g., excruciating, unable to do any normal activities) AND [2] not improved 2 hours after pain medicine  Answer Assessment - Initial Assessment Questions 1. ONSET: "When did the pain begin?"      *No Answer* 2. LOCATION: "Where does it hurt?" (upper, mid or lower back)     *No Answer* 3. SEVERITY: "How bad is the pain?"  (e.g., Scale 1-10; mild, moderate, or severe)   - MILD (1-3): doesn't interfere with normal activities    - MODERATE (4-7): interferes with normal activities or awakens from sleep    - SEVERE (8-10): excruciating pain, unable to do any normal activities      *No Answer* 4. PATTERN: "Is the pain constant?" (e.g., yes, no; constant, intermittent)      *No Answer* 5. RADIATION: "Does the pain shoot into your legs or elsewhere?"     *No Answer* 6. CAUSE:  "What do you think is causing the back pain?"      *No Answer* 7. BACK OVERUSE:  "Any recent lifting of heavy objects, strenuous work or exercise?"     *No Answer* 8. MEDICATIONS: "What have you taken so far for the pain?" (e.g., nothing, acetaminophen, NSAIDS)     Alieve (220 mg x 5 ) during day and Tylenol last 9. NEUROLOGIC SYMPTOMS: "Do you have any weakness, numbness, or problems with bowel/bladder control?"     no  10. OTHER  SYMPTOMS: "Do you have any other symptoms?" (e.g., fever, abdominal pain, burning with urination, blood in urine)     no 11. PREGNANCY: "Is there any chance you are pregnant?" (e.g., yes, no; LMP)     n/a  Protocols used: BACK PAIN-A-AH

## 2020-05-07 NOTE — Telephone Encounter (Signed)
It was just a FYI, you seen patient this morning,

## 2020-05-07 NOTE — Patient Instructions (Signed)
Lumbar Strain A lumbar strain, which is sometimes called a low-back strain, is a stretch or tear in a muscle or the strong cords of tissue that attach muscle to bone (tendons) in the lower back (lumbar spine). This type of injury occurs when muscles or tendons are torn or are stretched beyond their limits. Lumbar strains can range from mild to severe. Mild strains may involve stretching a muscle or tendon without tearing it. These may heal in 1-2 weeks. More severe strains involve tearing of muscle fibers or tendons. These will cause more pain and may take 6-8 weeks to heal. What are the causes? This condition may be caused by:  Trauma, such as a fall or a hit to the body.  Twisting or overstretching the back. This may result from doing activities that need a lot of energy, such as lifting heavy objects. What increases the risk? This injury is more common in:  Athletes.  People with obesity.  People who do repeated lifting, bending, or other movements that involve their back. What are the signs or symptoms? Symptoms of this condition may include:  Sharp or dull pain in the lower back that does not go away. The pain may extend to the buttocks.  Stiffness or limited range of motion.  Sudden muscle tightening (spasms). How is this diagnosed? This condition may be diagnosed based on:  Your symptoms.  Your medical history.  A physical exam.  Imaging tests, such as: ? X-rays. ? MRI. How is this treated? Treatment for this condition may include:  Rest.  Applying heat and cold to the affected area.  Over-the-counter medicines to help relieve pain and inflammation, such as NSAIDs.  Prescription pain medicine and muscle relaxants may be needed for a short time.  Physical therapy. Follow these instructions at home: Managing pain, stiffness, and swelling      If directed, put ice on the injured area during the first 24 hours after your injury. ? Put ice in a plastic  bag. ? Place a towel between your skin and the bag. ? Leave the ice on for 20 minutes, 2-3 times a day.  If directed, apply heat to the affected area as often as told by your health care provider. Use the heat source that your health care provider recommends, such as a moist heat pack or a heating pad. ? Place a towel between your skin and the heat source. ? Leave the heat on for 20-30 minutes. ? Remove the heat if your skin turns bright red. This is especially important if you are unable to feel pain, heat, or cold. You may have a greater risk of getting burned. Activity  Rest and return to your normal activities as told by your health care provider. Ask your health care provider what activities are safe for you.  Do exercises as told by your health care provider. Medicines  Take over-the-counter and prescription medicines only as told by your health care provider.  Ask your health care provider if the medicine prescribed to you: ? Requires you to avoid driving or using heavy machinery. ? Can cause constipation. You may need to take these actions to prevent or treat constipation:  Drink enough fluid to keep your urine pale yellow.  Take over-the-counter or prescription medicines.  Eat foods that are high in fiber, such as beans, whole grains, and fresh fruits and vegetables.  Limit foods that are high in fat and processed sugars, such as fried or sweet foods. Injury prevention To prevent   a future low-back injury:  Always warm up properly before physical activity or sports.  Cool down and stretch after being active.  Use correct form when playing sports and lifting heavy objects. Bend your knees before you lift heavy objects.  Use good posture when sitting and standing.  Stay physically fit and keep a healthy weight. ? Do at least 150 minutes of moderate-intensity exercise each week, such as brisk walking or water aerobics. ? Do strength exercises at least 2 times each  week.  General instructions  Do not use any products that contain nicotine or tobacco, such as cigarettes, e-cigarettes, and chewing tobacco. If you need help quitting, ask your health care provider.  Keep all follow-up visits as told by your health care provider. This is important. Contact a health care provider if:  Your back pain does not improve after 6 weeks of treatment.  Your symptoms get worse. Get help right away if:  Your back pain is severe.  You are unable to stand or walk.  You develop pain in your legs.  You develop weakness in your buttocks or legs.  You have difficulty controlling when you urinate or when you have a bowel movement. ? You have frequent, painful, or bloody urination. ? You have a temperature over 101.0F (38.3C) Summary  A lumbar strain, which is sometimes called a low-back strain, is a stretch or tear in a muscle or the strong cords of tissue that attach muscle to bone (tendons) in the lower back (lumbar spine).  This type of injury occurs when muscles or tendons are torn or are stretched beyond their limits.  Rest and return to your normal activities as told by your health care provider. If directed, apply heat and ice to the affected area as often as told by your health care provider.  Take over-the-counter and prescription medicines only as told by your health care provider.  Contact a health care provider if you have new or worsening symptoms. This information is not intended to replace advice given to you by your health care provider. Make sure you discuss any questions you have with your health care provider. Document Revised: 09/22/2018 Document Reviewed: 09/22/2018 Elsevier Patient Education  2020 [employer]  

## 2020-05-07 NOTE — Progress Notes (Signed)
Established patient visit   Patient: Benjamin Ford   DOB: 10-26-1961   59 y.o. Male  MRN: [id number] Visit Date: 05/07/2020  Today's healthcare provider: Trey Sailors, PA-C   Chief Complaint  Patient presents with  . Back Pain  I,Tanja Gift M Sanders Manninen,acting as a scribe for Trey Sailors, PA-C.,have documented all relevant documentation on the behalf of Trey Sailors, PA-C,as directed by  Trey Sailors, PA-C while in the presence of Trey Sailors, PA-C.   Subjective    Back Pain This is a new problem. The current episode started yesterday. The problem occurs constantly. The problem has been gradually worsening since onset. The quality of the pain is described as cramping. The pain is at a severity of 7/10. The pain is moderate. The pain is the same all the time. The symptoms are aggravated by bending, sitting, standing, twisting and position. Stiffness is present all day. Associated symptoms include weakness. Pertinent negatives include no abdominal pain, chest pain, headaches, leg pain, numbness, pelvic pain or tingling. He has tried heat and NSAIDs (Tylenol) for the symptoms. The treatment provided no relief.  Patient reports back spasms as well. He states that he may have injured his back from shoveling mulch on 05/06/2020. Lower back is hurting the most. Able to walk. No numbness or tingling in legs. No fevers or chills. He took 5 Aleve yesterday and two tylenol PM last night with no help. Also used some heat which felt good but didn't provide a lot of relief.       Medications: Outpatient Medications Prior to Visit  Medication Sig  . albuterol (PROVENTIL HFA;VENTOLIN HFA) 108 (90 Base) MCG/ACT inhaler Inhale 2 puffs into the lungs every 6 (six) hours as needed for wheezing or shortness of breath.  . triamcinolone cream (KENALOG) 0.1 % Apply 1 application topically 2 (two) times daily.  . famotidine (PEPCID) 20 MG tablet TAKE 1 TABLET BY MOUTH TWICE A DAY (Patient  not taking: Reported on 05/07/2020)   No facility-administered medications prior to visit.    Review of Systems  Cardiovascular: Negative for chest pain.  Gastrointestinal: Negative for abdominal pain.  Genitourinary: Negative for pelvic pain.  Musculoskeletal: Positive for back pain.  Neurological: Positive for weakness. Negative for tingling, numbness and headaches.      Objective    BP 126/88 (BP Location: Left Arm, Patient Position: Sitting, Cuff Size: Normal)   Pulse 72   Temp (!) 97.1 F (36.2 C) (Temporal)   SpO2 97%    Physical Exam Constitutional:      Appearance: Normal appearance.  Musculoskeletal:     Lumbar back: Decreased range of motion.     Comments: Pain in lower back.   Skin:    General: Skin is warm and dry.  Neurological:     Mental Status: He is alert and oriented to person, place, and time. Mental status is at baseline.     Motor: Motor function is intact. No weakness.     Comments: BLE strength 5/5.   Psychiatric:        Mood and Affect: Mood normal.        Behavior: Behavior normal.       No results found for any visits on 05/07/20.  Assessment & Plan    1. Strain of lumbar region, initial encounter  - predniSONE (DELTASONE) 10 MG tablet; Take 6 pills on day 1, 5 pills on day 2 and so on until complete.  Dispense:  21 tablet; Refill: 0 - cyclobenzaprine (FLEXERIL) 5 MG tablet; Take 1 tablet (5 mg total) by mouth 3 (three) times daily as needed for muscle spasms.  Dispense: 30 tablet; Refill: 0    Return if symptoms worsen or fail to improve.      ITrinna Post, PA-C, have reviewed all documentation for this visit. The documentation on 05/07/20 for the exam, diagnosis, procedures, and orders are all accurate and complete.    Paulene Floor  [facility] [phone] (phone) [phone] (fax)  [place]

## 2020-05-16 ENCOUNTER — Other Ambulatory Visit: Payer: Self-pay

## 2020-05-16 ENCOUNTER — Telehealth (INDEPENDENT_AMBULATORY_CARE_PROVIDER_SITE_OTHER): Payer: Self-pay | Admitting: Gastroenterology

## 2020-05-16 DIAGNOSIS — Z1211 Encounter for screening for malignant neoplasm of colon: Secondary | ICD-10-CM

## 2020-05-16 NOTE — Progress Notes (Signed)
Gastroenterology Pre-Procedure Review  Request Date: 05/27/20 Requesting Physician: Dr. Tobi Bastos  PATIENT REVIEW QUESTIONS: The patient responded to the following health history questions as indicated:    1. Are you having any GI issues? no 2. Do you have a personal history of Polyps? no 3. Do you have a family history of Colon Cancer or Polyps? no 4. Diabetes Mellitus? no 5. Joint replacements in the past 12 months?no 6. Major health problems in the past 3 months?no 7. Any artificial heart valves, MVP, or defibrillator?no    MEDICATIONS & ALLERGIES:    Patient reports the following regarding taking any anticoagulation/antiplatelet therapy:   Plavix, Coumadin, Eliquis, Xarelto, Lovenox, Pradaxa, Brilinta, or Effient? no Aspirin? no  Patient confirms/reports the following medications:  Current Outpatient Medications  Medication Sig Dispense Refill  . albuterol (PROVENTIL HFA;VENTOLIN HFA) 108 (90 Base) MCG/ACT inhaler Inhale 2 puffs into the lungs every 6 (six) hours as needed for wheezing or shortness of breath. 1 Inhaler 2  . cyclobenzaprine (FLEXERIL) 5 MG tablet Take 1 tablet (5 mg total) by mouth 3 (three) times daily as needed for muscle spasms. 30 tablet 0  . triamcinolone cream (KENALOG) 0.1 % Apply 1 application topically 2 (two) times daily. 30 g 0  . famotidine (PEPCID) 20 MG tablet TAKE 1 TABLET BY MOUTH TWICE A DAY (Patient not taking: Reported on 05/07/2020) 90 tablet 12   No current facility-administered medications for this visit.    Patient confirms/reports the following allergies:  No Known Allergies  No orders of the defined types were placed in this encounter.   AUTHORIZATION INFORMATION Primary Insurance: 1D#: Group #:  Secondary Insurance: 1D#: Group #:  SCHEDULE INFORMATION: Date: Monday 05/27/20 Time: Location:[facility]

## 2020-05-23 ENCOUNTER — Other Ambulatory Visit
Admission: RE | Admit: 2020-05-23 | Discharge: 2020-05-23 | Disposition: A | Discharge status: Home / Self Care | Payer: Managed Care, Other (non HMO) | Source: Ambulatory Visit | Attending: Gastroenterology | Admitting: Gastroenterology

## 2020-05-23 ENCOUNTER — Other Ambulatory Visit: Payer: Self-pay

## 2020-05-23 DIAGNOSIS — Z01812 Encounter for preprocedural laboratory examination: Secondary | ICD-10-CM | POA: Insufficient documentation

## 2020-05-23 DIAGNOSIS — Z20822 Contact with and (suspected) exposure to covid-19: Secondary | ICD-10-CM | POA: Insufficient documentation

## 2020-05-23 LAB — SARS CORONAVIRUS 2 (TAT 6-24 HRS): SARS Coronavirus 2: NEGATIVE

## 2020-05-27 ENCOUNTER — Encounter
Admission: RE | Disposition: A | Discharge status: Home / Self Care | Payer: Self-pay | Source: Home / Self Care | Attending: Gastroenterology

## 2020-05-27 ENCOUNTER — Ambulatory Visit: Payer: Managed Care, Other (non HMO) | Admitting: Anesthesiology

## 2020-05-27 ENCOUNTER — Other Ambulatory Visit: Payer: Self-pay

## 2020-05-27 ENCOUNTER — Ambulatory Visit
Admission: RE | Admit: 2020-05-27 | Discharge: 2020-05-27 | Disposition: A | Discharge status: Home / Self Care | Payer: Managed Care, Other (non HMO) | Attending: Gastroenterology | Admitting: Gastroenterology

## 2020-05-27 ENCOUNTER — Encounter: Payer: Self-pay | Admitting: Gastroenterology

## 2020-05-27 DIAGNOSIS — I10 Essential (primary) hypertension: Secondary | ICD-10-CM | POA: Diagnosis not present

## 2020-05-27 DIAGNOSIS — Z1211 Encounter for screening for malignant neoplasm of colon: Secondary | ICD-10-CM | POA: Insufficient documentation

## 2020-05-27 DIAGNOSIS — K219 Gastro-esophageal reflux disease without esophagitis: Secondary | ICD-10-CM | POA: Insufficient documentation

## 2020-05-27 DIAGNOSIS — J45909 Unspecified asthma, uncomplicated: Secondary | ICD-10-CM | POA: Insufficient documentation

## 2020-05-27 DIAGNOSIS — Z79899 Other long term (current) drug therapy: Secondary | ICD-10-CM | POA: Diagnosis not present

## 2020-05-27 DIAGNOSIS — N400 Enlarged prostate without lower urinary tract symptoms: Secondary | ICD-10-CM | POA: Insufficient documentation

## 2020-05-27 DIAGNOSIS — Z87891 Personal history of nicotine dependence: Secondary | ICD-10-CM | POA: Insufficient documentation

## 2020-05-27 DIAGNOSIS — F419 Anxiety disorder, unspecified: Secondary | ICD-10-CM | POA: Diagnosis not present

## 2020-05-27 HISTORY — PX: COLONOSCOPY WITH PROPOFOL: SHX5780

## 2020-05-27 SURGERY — COLONOSCOPY WITH PROPOFOL
Anesthesia: General

## 2020-05-27 MED ORDER — LIDOCAINE HCL (CARDIAC) PF 100 MG/5ML IV SOSY
PREFILLED_SYRINGE | INTRAVENOUS | Status: DC | PRN
  Administered 2020-05-27: 40 mg via INTRAVENOUS

## 2020-05-27 MED ORDER — PROPOFOL 10 MG/ML IV BOLUS
INTRAVENOUS | Status: DC | PRN
  Administered 2020-05-27: 100 mg via INTRAVENOUS
  Administered 2020-05-27 (×2): 20 mg via INTRAVENOUS

## 2020-05-27 MED ORDER — SODIUM CHLORIDE 0.9 % IV SOLN
INTRAVENOUS | Status: DC
  Administered 2020-05-27: 20 mL/h via INTRAVENOUS

## 2020-05-27 MED ORDER — FENTANYL CITRATE (PF) 100 MCG/2ML IJ SOLN
INTRAMUSCULAR | Status: AC
  Filled 2020-05-27: qty 2

## 2020-05-27 MED ORDER — PROPOFOL 500 MG/50ML IV EMUL
INTRAVENOUS | Status: DC | PRN
  Administered 2020-05-27: 150 ug/kg/min via INTRAVENOUS

## 2020-05-27 MED ORDER — PROPOFOL 500 MG/50ML IV EMUL
INTRAVENOUS | Status: AC
  Filled 2020-05-27: qty 50

## 2020-05-27 MED ORDER — FENTANYL CITRATE (PF) 100 MCG/2ML IJ SOLN
INTRAMUSCULAR | Status: DC | PRN
  Administered 2020-05-27: 25 ug via INTRAVENOUS

## 2020-05-27 MED ORDER — LIDOCAINE HCL (PF) 2 % IJ SOLN
INTRAMUSCULAR | Status: AC
  Filled 2020-05-27: qty 5

## 2020-05-27 NOTE — Anesthesia Postprocedure Evaluation (Signed)
Anesthesia Post Note  Patient: Benjamin Ford  Procedure(s) Performed: COLONOSCOPY WITH PROPOFOL (N/A )  Patient location during evaluation: Endoscopy Anesthesia Type: General Level of consciousness: awake and alert Pain management: pain level controlled Vital Signs Assessment: post-procedure vital signs reviewed and stable Respiratory status: spontaneous breathing, nonlabored ventilation, respiratory function stable and patient connected to nasal cannula oxygen Cardiovascular status: blood pressure returned to baseline and stable Postop Assessment: no apparent nausea or vomiting Anesthetic complications: no   No complications documented.   Last Vitals:  Vitals:   05/27/20 1040 05/27/20 1049  BP: (!) 147/94 (!) 157/91  Pulse: 64 60  Resp: 14 14  Temp:    SpO2: 100% 100%    Last Pain:  Vitals:   05/27/20 1025  TempSrc: Tympanic  PainSc:                  Lenard Simmer

## 2020-05-27 NOTE — Transfer of Care (Signed)
Immediate Anesthesia Transfer of Care Note  Patient: Benjamin Ford  Procedure(s) Performed: Procedure(s): COLONOSCOPY WITH PROPOFOL (N/A)  Patient Location: PACU and Endoscopy Unit  Anesthesia Type:General  Level of Consciousness: sedated  Airway & Oxygen Therapy: Patient Spontanous Breathing and Patient connected to nasal cannula oxygen  Post-op Assessment: Report given to RN and Post -op Vital signs reviewed and stable  Post vital signs: Reviewed and stable  Last Vitals:  Vitals:   05/27/20 1025 05/27/20 1026  BP:  123/70  Pulse:  72  Resp: (P) 20 15  Temp: (!) (P) 36 C   SpO2:  100%    Complications: No apparent anesthesia complications

## 2020-05-27 NOTE — Anesthesia Preprocedure Evaluation (Signed)
Anesthesia Evaluation  Patient identified by MRN, date of birth, ID band Patient awake    Reviewed: Allergy & Precautions, H&P , NPO status , Patient's Chart, lab work & pertinent test results, reviewed documented beta blocker date and time   History of Anesthesia Complications Negative for: history of anesthetic complications  Airway Mallampati: I  TM Distance: >3 FB Neck ROM: full    Dental  (+) Dental Advidsory Given, Missing, Teeth Intact, Chipped Bridge on the bottom left:   Pulmonary neg shortness of breath, asthma , neg sleep apnea, neg COPD, neg recent URI, former smoker,    Pulmonary exam normal breath sounds clear to auscultation       Cardiovascular Exercise Tolerance: Good hypertension (borderline), (-) angina(-) Past MI and (-) Cardiac Stents Normal cardiovascular exam(-) dysrhythmias (-) Valvular Problems/Murmurs Rhythm:regular Rate:Normal     Neuro/Psych PSYCHIATRIC DISORDERS Anxiety negative neurological ROS     GI/Hepatic Neg liver ROS, PUD, GERD  ,  Endo/Other  negative endocrine ROS  Renal/GU negative Renal ROS  negative genitourinary   Musculoskeletal   Abdominal   Peds  Hematology negative hematology ROS (+)   Anesthesia Other Findings Past Medical History: No date: Anxiety No date: Asthma No date: BPH (benign prostatic hyperplasia) No date: GERD (gastroesophageal reflux disease) No date: HTN (hypertension) No date: Insomnia No date: Palpitations No date: Stomach ulcer No date: Testicular mass   Reproductive/Obstetrics negative OB ROS                             Anesthesia Physical Anesthesia Plan  ASA: II  Anesthesia Plan: General   Post-op Pain Management:    Induction: Intravenous  PONV Risk Score and Plan: 2 and Propofol infusion and TIVA  Airway Management Planned: Natural Airway and Nasal Cannula  Additional Equipment:   Intra-op Plan:    Post-operative Plan:   Informed Consent: I have reviewed the patients History and Physical, chart, labs and discussed the procedure including the risks, benefits and alternatives for the proposed anesthesia with the patient or authorized representative who has indicated his/her understanding and acceptance.     Dental Advisory Given  Plan Discussed with: Anesthesiologist, CRNA and Surgeon  Anesthesia Plan Comments:         Anesthesia Quick Evaluation

## 2020-05-27 NOTE — Progress Notes (Signed)
Complete physical exam   Patient: Benjamin Ford   DOB: [birth year]   59 y.o. Male  MRN: [id number] Visit Date: 05/28/2020  Today's healthcare provider: Trinna Post, PA-C   Chief Complaint  Patient presents with  . Annual Exam   Subjective    Hermon Zea is a 59 y.o. male who presents today for a complete physical exam.  He reports consuming a general diet. The patient does not participate in regular exercise at present. He generally feels fairly well. He reports sleeping fairly well. He does not have additional problems to discuss today. He reports that his back pain is progressively getting better.   He had his colonoscopy yesterday (05/27/2020), and reports that he tolerated the procedure well. He was informed that his test was normal. He is due for a tetanus and a shingles vaccine. He is agreeable to updating Tdap today, and would like to research more on the shingles vaccine.    Elevated BP, follow-up  BP Readings from Last 3 Encounters:  05/28/20 (!) 148/96  05/27/20 (!) 157/91  05/07/20 126/88   Wt Readings from Last 3 Encounters:  05/28/20 168 lb (76.2 kg)  05/27/20 160 lb (72.6 kg)  12/11/19 170 lb (77.1 kg)      He is following a Regular diet. He is exercising. He does not smoke.  Use of agents associated with hypertension: none.   Outside blood pressures are not checked. Symptoms: No chest pain No chest pressure  No palpitations No syncope  No dyspnea No orthopnea  No paroxysmal nocturnal dyspnea No lower extremity edema   Pertinent labs: Lab Results  Component Value Date   CHOL 202 (H) 05/28/2020   HDL 88 05/28/2020   LDLCALC 101 (H) 05/28/2020   TRIG 74 05/28/2020   CHOLHDL 2.3 05/28/2020   Lab Results  Component Value Date   NA 140 05/28/2020   K 3.9 05/28/2020   CREATININE 0.91 05/28/2020   GFRNONAA 91 05/28/2020   GFRAA 106 05/28/2020   GLUCOSE 83 05/28/2020     The 10-year ASCVD risk score Mikey Bussing DC Jr., et al., 2013) is:  7.4%   ---------------------------------------------------------------------------------------------------  Past Medical History:  Diagnosis Date  . Anxiety   . Asthma   . BPH (benign prostatic hyperplasia)   . GERD (gastroesophageal reflux disease)   . HTN (hypertension)   . Insomnia   . Palpitations   . Stomach ulcer   . Testicular mass    Past Surgical History:  Procedure Laterality Date  . COLONOSCOPY WITH PROPOFOL N/A 05/27/2020   Procedure: COLONOSCOPY WITH PROPOFOL;  Surgeon: [name], MD;  Location: [facility] ENDOSCOPY;  Service: Gastroenterology;  Laterality: N/A;  . INGUINAL HERNIA REPAIR  2008  . MASTECTOMY Bilateral 1992/1981  . VASECTOMY  1983   Social History   Socioeconomic History  . Marital status: Married    Spouse name: Not on file  . Number of children: Not on file  . Years of education: Not on file  . Highest education level: Not on file  Occupational History  . Not on file  Tobacco Use  . Smoking status: Former Research scientist (life sciences)  . Smokeless tobacco: Never Used  . Tobacco comment: smoked <1  ppd off and on from mid 15s to mid 73s.   Vaping Use  . Vaping Use: Never used  Substance and Sexual Activity  . Alcohol use: Yes    Alcohol/week: 0.0 standard drinks    Comment: moderate  . Drug use: No  .  Sexual activity: Not on file  Other Topics Concern  . Not on file  Social History Narrative  . Not on file   Social Determinants of Health   Financial Resource Strain:   . Difficulty of Paying Living Expenses:   Food Insecurity:   . Worried About Programme researcher, broadcasting/film/video in the Last Year:   . Barista in the Last Year:   Transportation Needs:   . Freight forwarder (Medical):   Marland Kitchen Lack of Transportation (Non-Medical):   Physical Activity:   . Days of Exercise per Week:   . Minutes of Exercise per Session:   Stress:   . Feeling of Stress :   Social Connections:   . Frequency of Communication with Friends and Family:   . Frequency of Social  Gatherings with Friends and Family:   . Attends Religious Services:   . Active Member of Clubs or Organizations:   . Attends Banker Meetings:   Marland Kitchen Marital Status:   Intimate Partner Violence:   . Fear of Current or Ex-Partner:   . Emotionally Abused:   Marland Kitchen Physically Abused:   . Sexually Abused:    Family Status  Relation Name Status  . Father  Deceased at age 55       bone and prostate cancer  . Mother  Deceased at age 19  . MGM  Deceased  . PGM  Deceased  . Brother  (Not Specified)  . Brother  (Not Specified)  . Neg Hx  (Not Specified)   Family History  Problem Relation Age of Onset  . Prostate cancer Father   . Cancer Father        bone, prostate  . COPD Mother   . Cancer Mother        lung  . Cancer Maternal Grandmother        brain  . Aneurysm Paternal Grandmother   . Prostate cancer Brother   . Prostate cancer Brother   . Kidney disease Neg Hx   . Bladder Cancer Neg Hx    No Known Allergies  Patient Care Team: [name] as PCP - General (Physician Assistant)   Medications: Outpatient Medications Prior to Visit  Medication Sig  . albuterol (PROVENTIL HFA;VENTOLIN HFA) 108 (90 Base) MCG/ACT inhaler Inhale 2 puffs into the lungs every 6 (six) hours as needed for wheezing or shortness of breath.  . cyclobenzaprine (FLEXERIL) 5 MG tablet Take 1 tablet (5 mg total) by mouth 3 (three) times daily as needed for muscle spasms.  Marland Kitchen triamcinolone cream (KENALOG) 0.1 % Apply 1 application topically 2 (two) times daily.  . famotidine (PEPCID) 20 MG tablet TAKE 1 TABLET BY MOUTH TWICE A DAY (Patient not taking: Reported on 05/28/2020)   No facility-administered medications prior to visit.    Review of Systems  Constitutional: Negative.   HENT: Negative.   Eyes: Negative.   Respiratory: Negative.   Cardiovascular: Negative.   Gastrointestinal: Negative.   Endocrine: Negative.   Genitourinary: Negative.   Musculoskeletal: Negative.   Skin:  Negative.   Allergic/Immunologic: Negative.   Neurological: Negative.   Hematological: Negative.   Psychiatric/Behavioral: Negative.       Objective    BP (!) 148/96   Pulse 78   Temp (!) 97.5 F (36.4 C)   Resp 16   Ht 5\' 6"  (1.676 m)   Wt 168 lb (76.2 kg)   BMI 27.12 kg/m    Physical Exam Constitutional:  Appearance: Normal appearance.  Cardiovascular:     Rate and Rhythm: Normal rate and regular rhythm.     Pulses: Normal pulses.     Heart sounds: Normal heart sounds.  Pulmonary:     Effort: Pulmonary effort is normal.     Breath sounds: Normal breath sounds.  Abdominal:     General: Bowel sounds are normal.  Neurological:     Mental Status: He is alert and oriented to person, place, and time. Mental status is at baseline.  Psychiatric:        Mood and Affect: Mood normal.        Behavior: Behavior normal.       Depression Screen  PHQ 2/9 Scores 05/28/2020 12/01/2017  PHQ - 2 Score 0 0    No results found for any visits on 05/28/20.  Assessment & Plan    1. Annual physical exam  - CBC with Differential/Platelet - Comprehensive metabolic panel - Lipid panel - TSH - HIV antibody (with reflex)  2. Need for hepatitis C screening test  - Hepatitis C antibody  3. Hypertension, unspecified type BP was slightly elevated today. We will continue to monitor this. Patient will follow up in 6 months to reassess. DASH diet info will be sent home with the patient. Also recommended patient to check BP at home.   4. Need for Tdap vaccination  - Tdap vaccine greater than or equal to 7yo IM   Routine Health Maintenance and Physical Exam  Exercise Activities and Dietary recommendations Goals   None     Immunization History  Administered Date(s) Administered  . PFIZER SARS-COV-2 Vaccination 02/20/2020, 03/12/2020  . Tdap 05/28/2020    Health Maintenance  Topic Date Due  . Hepatitis C Screening  Never done  . HIV Screening  Never done  .  INFLUENZA VACCINE  07/07/2020  . COLONOSCOPY  05/27/2030  . TETANUS/TDAP  05/28/2030  . COVID-19 Vaccine  Completed    Discussed health benefits of physical activity, and encouraged him to engage in regular exercise appropriate for his age and condition.    No follow-ups on file.        [name]  [facility] [phone] (phone) [phone] (fax)  [facility]

## 2020-05-27 NOTE — Op Note (Signed)
[facility] Gastroenterology Patient Name: Benjamin Ford Procedure Date: 05/27/2020 9:53 AM MRN: [id number] Account #: [id number] Date of Birth: 05/17/1961 Admit Type: Outpatient Age: 59 Room: [facility] ENDO ROOM 4 Gender: Male Note Status: Finalized Procedure:             Colonoscopy Indications:           Screening for colorectal malignant neoplasm Providers:             [name] MD, MD Medicines:             Monitored Anesthesia Care Complications:         No immediate complications. Procedure:             Pre-Anesthesia Assessment:                        - Prior to the procedure, a History and Physical was                         performed, and patient medications, allergies and                         sensitivities were reviewed. The patient's tolerance                         of previous anesthesia was reviewed.                        - The risks and benefits of the procedure and the                         sedation options and risks were discussed with the                         patient. All questions were answered and informed                         consent was obtained.                        - ASA Grade Assessment: II - A patient with mild                         systemic disease.                        After obtaining informed consent, the colonoscope was                         passed under direct vision. Throughout the procedure,                         the patient's blood pressure, pulse, and oxygen                         saturations were monitored continuously. The                         Colonoscope was introduced through the anus and  advanced to the the cecum, identified by the                         appendiceal orifice. The colonoscopy was performed                         with ease. The patient tolerated the procedure well.                         The quality of the bowel preparation was excellent. Findings:      The  perianal and digital rectal examinations were normal.      The entire examined colon appeared normal on direct and retroflexion       views. Impression:            - The entire examined colon is normal on direct and                         retroflexion views.                        - No specimens collected. Recommendation:        - Discharge patient to home (with escort).                        - Resume previous diet.                        - Continue present medications.                        - Repeat colonoscopy in 10 years for screening                         purposes. Procedure Code(s):     --- Professional ---                        [phone], Colonoscopy, flexible; diagnostic, including                         collection of specimen(s) by brushing or washing, when                         performed (separate procedure) Diagnosis Code(s):     --- Professional ---                        Z12.11, Encounter for screening for malignant neoplasm                         of colon CPT copyright 2019 American Medical Association. All rights reserved. The codes documented in this report are preliminary and upon coder review may  be revised to meet current compliance requirements. Wyline Mood, MD Wyline Mood MD, MD 05/27/2020 10:22:51 AM This report has been signed electronically. Number of Addenda: 0 Note Initiated On: 05/27/2020 9:53 AM Scope Withdrawal Time: 0 hours 10 minutes 44 seconds  Total Procedure Duration: 0 hours 13 minutes 59 seconds  Estimated Blood Loss:  Estimated blood loss: none.      [facility]

## 2020-05-27 NOTE — Anesthesia Procedure Notes (Signed)
Date/Time: 05/27/2020 10:00 AM Performed by: Stormy Fabian, CRNA Pre-anesthesia Checklist: Patient identified, Emergency Drugs available, Suction available and Patient being monitored Patient Re-evaluated:Patient Re-evaluated prior to induction Oxygen Delivery Method: Nasal cannula Induction Type: IV induction Dental Injury: Teeth and Oropharynx as per pre-operative assessment  Comments: Nasal cannula with etCO2 monitoring

## 2020-05-27 NOTE — H&P (Signed)
Benjamin Bellows, MD [address], [place], [place], [place], [address] [address], [place], [place], [place], [address] Phone: [phone]  Fax: [phone]  Primary Care Physician:  Trinna Post, PA-C   Pre-Procedure History & Physical: HPI:  Benjamin Ford is a 59 y.o. male is here for an colonoscopy.   Past Medical History:  Diagnosis Date  . Anxiety   . Asthma   . BPH (benign prostatic hyperplasia)   . GERD (gastroesophageal reflux disease)   . HTN (hypertension)   . Insomnia   . Palpitations   . Stomach ulcer   . Testicular mass     Past Surgical History:  Procedure Laterality Date  . INGUINAL HERNIA REPAIR  2008  . MASTECTOMY Bilateral 1992/1981  . VASECTOMY  1983    Prior to Admission medications   Medication Sig Start Date End Date Taking? Authorizing Provider  albuterol (PROVENTIL HFA;VENTOLIN HFA) 108 (90 Base) MCG/ACT inhaler Inhale 2 puffs into the lungs every 6 (six) hours as needed for wheezing or shortness of breath. 10/26/18  Yes Trinna Post, PA-C  cyclobenzaprine (FLEXERIL) 5 MG tablet Take 1 tablet (5 mg total) by mouth 3 (three) times daily as needed for muscle spasms. 05/07/20  Yes Carles Collet M, PA-C  famotidine (PEPCID) 20 MG tablet TAKE 1 TABLET BY MOUTH TWICE A DAY 10/03/19  Yes Birdie Sons, MD  triamcinolone cream (KENALOG) 0.1 % Apply 1 application topically 2 (two) times daily. 08/24/19  Yes Trinna Post, PA-C    Allergies as of 05/16/2020  . (No Known Allergies)    Family History  Problem Relation Age of Onset  . Prostate cancer Father   . Cancer Father        bone, prostate  . COPD Mother   . Cancer Mother        lung  . Cancer Maternal Grandmother        brain  . Aneurysm Paternal Grandmother   . Prostate cancer Brother   . Prostate cancer Brother   . Kidney disease Neg Hx   . Bladder Cancer Neg Hx     Social History   Socioeconomic History  . Marital status: Married    Spouse name: Not on  file  . Number of children: Not on file  . Years of education: Not on file  . Highest education level: Not on file  Occupational History  . Not on file  Tobacco Use  . Smoking status: Former Research scientist (life sciences)  . Smokeless tobacco: Never Used  . Tobacco comment: smoked <1  ppd off and on from mid 40s to mid 72s.   Vaping Use  . Vaping Use: Never used  Substance and Sexual Activity  . Alcohol use: Yes    Alcohol/week: 0.0 standard drinks    Comment: moderate  . Drug use: No  . Sexual activity: Not on file  Other Topics Concern  . Not on file  Social History Narrative  . Not on file   Social Determinants of Health   Financial Resource Strain:   . Difficulty of Paying Living Expenses:   Food Insecurity:   . Worried About Charity fundraiser in the Last Year:   . Arboriculturist in the Last Year:   Transportation Needs:   . Film/video editor (Medical):   Marland Kitchen Lack of Transportation (Non-Medical):   Physical Activity:   . Days of Exercise per Week:   . Minutes of Exercise per  Session:   Stress:   . Feeling of Stress :   Social Connections:   . Frequency of Communication with Friends and Family:   . Frequency of Social Gatherings with Friends and Family:   . Attends Religious Services:   . Active Member of Clubs or Organizations:   . Attends Banker Meetings:   Marland Kitchen Marital Status:   Intimate Partner Violence:   . Fear of Current or Ex-Partner:   . Emotionally Abused:   Marland Kitchen Physically Abused:   . Sexually Abused:     Review of Systems: See HPI, otherwise negative ROS  Physical Exam: BP (!) 166/85   Pulse 68   Temp 97.6 F (36.4 C) (Temporal)   Resp 20   Ht 5\' 6"  (1.676 m)   Wt 72.6 kg   SpO2 100%   BMI 25.82 kg/m  General:   Alert,  pleasant and cooperative in NAD Head:  Normocephalic and atraumatic. Neck:  Supple; no masses or thyromegaly. Lungs:  Clear throughout to auscultation, normal respiratory effort.    Heart:  +S1, +S2, Regular rate and rhythm,  No edema. Abdomen:  Soft, nontender and nondistended. Normal bowel sounds, without guarding, and without rebound.   Neurologic:  Alert and  oriented x4;  grossly normal neurologically.  Impression/Plan: Benjamin Ford is here for an colonoscopy to be performed for Screening colonoscopy average risk   Risks, benefits, limitations, and alternatives regarding  colonoscopy have been reviewed with the patient.  Questions have been answered.  All parties agreeable.   [name], MD  05/27/2020, 9:59 AM

## 2020-05-28 ENCOUNTER — Encounter: Payer: Self-pay | Admitting: Gastroenterology

## 2020-05-28 ENCOUNTER — Ambulatory Visit (INDEPENDENT_AMBULATORY_CARE_PROVIDER_SITE_OTHER): Payer: Managed Care, Other (non HMO) | Admitting: Physician Assistant

## 2020-05-28 VITALS — BP 148/96 | HR 78 | Temp 97.5°F | Resp 16 | Ht 66.0 in | Wt 168.0 lb

## 2020-05-28 DIAGNOSIS — Z0000 Encounter for general adult medical examination without abnormal findings: Secondary | ICD-10-CM

## 2020-05-28 DIAGNOSIS — I10 Essential (primary) hypertension: Secondary | ICD-10-CM | POA: Diagnosis not present

## 2020-05-28 DIAGNOSIS — Z23 Encounter for immunization: Secondary | ICD-10-CM

## 2020-05-28 DIAGNOSIS — Z1159 Encounter for screening for other viral diseases: Secondary | ICD-10-CM

## 2020-05-28 NOTE — Patient Instructions (Signed)
Shringrix is recommended to help decrease chances for getting Shingles.     DASH Eating Plan DASH stands for "Dietary Approaches to Stop Hypertension." The DASH eating plan is a healthy eating plan that has been shown to reduce high blood pressure (hypertension). It may also reduce your risk for type 2 diabetes, heart disease, and stroke. The DASH eating plan may also help with weight loss. What are tips for following this plan?  General guidelines  Avoid eating more than 2,300 mg (milligrams) of salt (sodium) a day. If you have hypertension, you may need to reduce your sodium intake to 1,500 mg a day.  Limit alcohol intake to no more than 1 drink a day for nonpregnant women and 2 drinks a day for men. One drink equals 12 oz of beer, 5 oz of wine, or 1 oz of hard liquor.  Work with your health care provider to maintain a healthy body weight or to lose weight. Ask what an ideal weight is for you.  Get at least 30 minutes of exercise that causes your heart to beat faster (aerobic exercise) most days of the week. Activities may include walking, swimming, or biking.  Work with your health care provider or diet and nutrition specialist (dietitian) to adjust your eating plan to your individual calorie needs. Reading food labels   Check food labels for the amount of sodium per serving. Choose foods with less than 5 percent of the Daily Value of sodium. Generally, foods with less than 300 mg of sodium per serving fit into this eating plan.  To find whole grains, look for the word "whole" as the first word in the ingredient list. Shopping  Buy products labeled as "low-sodium" or "no salt added."  Buy fresh foods. Avoid canned foods and premade or frozen meals. Cooking  Avoid adding salt when cooking. Use salt-free seasonings or herbs instead of table salt or sea salt. Check with your health care provider or pharmacist before using salt substitutes.  Do not fry foods. Cook foods using  healthy methods such as baking, boiling, grilling, and broiling instead.  Cook with heart-healthy oils, such as olive, canola, soybean, or sunflower oil. Meal planning  Eat a balanced diet that includes: ? 5 or more servings of fruits and vegetables each day. At each meal, try to fill half of your plate with fruits and vegetables. ? Up to 6-8 servings of whole grains each day. ? Less than 6 oz of lean meat, poultry, or fish each day. A 3-oz serving of meat is about the same size as a deck of cards. One egg equals 1 oz. ? 2 servings of low-fat dairy each day. ? A serving of nuts, seeds, or beans 5 times each week. ? Heart-healthy fats. Healthy fats called Omega-3 fatty acids are found in foods such as flaxseeds and coldwater fish, like sardines, salmon, and mackerel.  Limit how much you eat of the following: ? Canned or prepackaged foods. ? Food that is high in trans fat, such as fried foods. ? Food that is high in saturated fat, such as fatty meat. ? Sweets, desserts, sugary drinks, and other foods with added sugar. ? Full-fat dairy products.  Do not salt foods before eating.  Try to eat at least 2 vegetarian meals each week.  Eat more home-cooked food and less restaurant, buffet, and fast food.  When eating at a restaurant, ask that your food be prepared with less salt or no salt, if possible. What foods are recommended?  The items listed may not be a complete list. Talk with your dietitian about what dietary choices are best for you. Grains Whole-grain or whole-wheat bread. Whole-grain or whole-wheat pasta. Brown rice. [place]. Bulgur. Whole-grain and low-sodium cereals. Pita bread. Low-fat, low-sodium crackers. Whole-wheat flour tortillas. Vegetables Fresh or frozen vegetables (raw, steamed, roasted, or grilled). Low-sodium or reduced-sodium tomato and vegetable juice. Low-sodium or reduced-sodium tomato sauce and tomato paste. Low-sodium or reduced-sodium canned  vegetables. Fruits All fresh, dried, or frozen fruit. Canned fruit in natural juice (without added sugar). Meat and other protein foods Skinless chicken or [place]. Ground chicken or [place]. Pork with fat trimmed off. Fish and seafood. Egg whites. Dried beans, peas, or lentils. Unsalted nuts, nut butters, and seeds. Unsalted canned beans. Lean cuts of beef with fat trimmed off. Low-sodium, lean deli meat. Dairy Low-fat (1%) or fat-free (skim) milk. Fat-free, low-fat, or reduced-fat cheeses. Nonfat, low-sodium ricotta or cottage cheese. Low-fat or nonfat yogurt. Low-fat, low-sodium cheese. Fats and oils Soft margarine without trans fats. Vegetable oil. Low-fat, reduced-fat, or light mayonnaise and salad dressings (reduced-sodium). Canola, safflower, olive, soybean, and sunflower oils. Avocado. Seasoning and other foods Herbs. Spices. Seasoning mixes without salt. Unsalted popcorn and pretzels. Fat-free sweets. What foods are not recommended? The items listed may not be a complete list. Talk with your dietitian about what dietary choices are best for you. Grains Baked goods made with fat, such as croissants, muffins, or some breads. Dry pasta or rice meal packs. Vegetables Creamed or fried vegetables. Vegetables in a cheese sauce. Regular canned vegetables (not low-sodium or reduced-sodium). Regular canned tomato sauce and paste (not low-sodium or reduced-sodium). Regular tomato and vegetable juice (not low-sodium or reduced-sodium). Angie Fava. Olives. Fruits Canned fruit in a light or heavy syrup. Fried fruit. Fruit in cream or butter sauce. Meat and other protein foods Fatty cuts of meat. Ribs. Fried meat. Berniece Salines. Sausage. Bologna and other processed lunch meats. Salami. Fatback. Hotdogs. Bratwurst. Salted nuts and seeds. Canned beans with added salt. Canned or smoked fish. Whole eggs or egg yolks. Chicken or [place] with skin. Dairy Whole or 2% milk, cream, and half-and-half. Whole or full-fat  cream cheese. Whole-fat or sweetened yogurt. Full-fat cheese. Nondairy creamers. Whipped toppings. Processed cheese and cheese spreads. Fats and oils Butter. Stick margarine. Lard. Shortening. Ghee. Bacon fat. Tropical oils, such as coconut, palm kernel, or palm oil. Seasoning and other foods Salted popcorn and pretzels. Onion salt, garlic salt, seasoned salt, table salt, and sea salt. Worcestershire sauce. Tartar sauce. Barbecue sauce. Teriyaki sauce. Soy sauce, including reduced-sodium. Steak sauce. Canned and packaged gravies. Fish sauce. Oyster sauce. Cocktail sauce. Horseradish that you find on the shelf. Ketchup. Mustard. Meat flavorings and tenderizers. Bouillon cubes. Hot sauce and Tabasco sauce. Premade or packaged marinades. Premade or packaged taco seasonings. Relishes. Regular salad dressings. Where to find more information:  National Heart, Lung, and [place]: https://wilson-eaton.com/  American Heart Association: www.heart.org Summary  The DASH eating plan is a healthy eating plan that has been shown to reduce high blood pressure (hypertension). It may also reduce your risk for type 2 diabetes, heart disease, and stroke.  With the DASH eating plan, you should limit salt (sodium) intake to 2,300 mg a day. If you have hypertension, you may need to reduce your sodium intake to 1,500 mg a day.  When on the DASH eating plan, aim to eat more fresh fruits and vegetables, whole grains, lean proteins, low-fat dairy, and heart-healthy fats.  Work with your health care  provider or diet and nutrition specialist (dietitian) to adjust your eating plan to your individual calorie needs. This information is not intended to replace advice given to you by your health care provider. Make sure you discuss any questions you have with your health care provider. Document Revised: 11/05/2017 Document Reviewed: 11/16/2016 Elsevier Patient Education  2020 [employer].

## 2020-05-29 LAB — LIPID PANEL
Chol/HDL Ratio: 2.3 ratio (ref 0.0–5.0)
Cholesterol, Total: 202 mg/dL — ABNORMAL HIGH (ref 100–199)
HDL: 88 mg/dL (ref >39)
LDL Chol Calc (NIH): 101 mg/dL — ABNORMAL HIGH (ref 0–99)
Triglycerides: 74 mg/dL (ref 0–149)
VLDL Cholesterol Cal: 13 mg/dL (ref 5–40)

## 2020-05-29 LAB — CBC WITH DIFFERENTIAL/PLATELET
Basophils Absolute: 0.1 10*3/uL (ref 0.0–0.2)
Basos: 1 %
EOS (ABSOLUTE): 0.3 10*3/uL (ref 0.0–0.4)
Eos: 4 %
Hematocrit: 42.8 % (ref 37.5–51.0)
Hemoglobin: 15 g/dL (ref 13.0–17.7)
Immature Grans (Abs): 0 10*3/uL (ref 0.0–0.1)
Immature Granulocytes: 0 %
Lymphocytes Absolute: 1.6 10*3/uL (ref 0.7–3.1)
Lymphs: 21 %
MCH: 32.8 pg (ref 26.6–33.0)
MCHC: 35 g/dL (ref 31.5–35.7)
MCV: 93 fL (ref 79–97)
Monocytes Absolute: 0.6 10*3/uL (ref 0.1–0.9)
Monocytes: 7 %
Neutrophils Absolute: 5.1 10*3/uL (ref 1.4–7.0)
Neutrophils: 67 %
Platelets: 202 10*3/uL (ref 150–450)
RBC: 4.58 x10E6/uL (ref 4.14–5.80)
RDW: 12.5 % (ref 11.6–15.4)
WBC: 7.6 10*3/uL (ref 3.4–10.8)

## 2020-05-29 LAB — COMPREHENSIVE METABOLIC PANEL
ALT: 43 IU/L (ref 0–44)
AST: 27 IU/L (ref 0–40)
Albumin/Globulin Ratio: 2.6 — ABNORMAL HIGH (ref 1.2–2.2)
Albumin: 4.9 g/dL (ref 3.8–4.9)
Alkaline Phosphatase: 55 IU/L (ref 48–121)
BUN/Creatinine Ratio: 19 (ref 10–24)
BUN: 17 mg/dL (ref 8–27)
Bilirubin Total: 0.5 mg/dL (ref 0.0–1.2)
CO2: 24 mmol/L (ref 20–29)
Calcium: 9.6 mg/dL (ref 8.6–10.2)
Chloride: 101 mmol/L (ref 96–106)
Creatinine, Ser: 0.91 mg/dL (ref 0.76–1.27)
GFR calc Af Amer: 106 mL/min/{1.73_m2} (ref >59)
GFR calc non Af Amer: 91 mL/min/{1.73_m2} (ref >59)
Globulin, Total: 1.9 g/dL (ref 1.5–4.5)
Glucose: 83 mg/dL (ref 65–99)
Potassium: 3.9 mmol/L (ref 3.5–5.2)
Sodium: 140 mmol/L (ref 134–144)
Total Protein: 6.8 g/dL (ref 6.0–8.5)

## 2020-05-29 LAB — TSH: TSH: 1.62 u[IU]/mL (ref 0.450–4.500)

## 2020-05-29 LAB — HIV ANTIBODY (ROUTINE TESTING W REFLEX): HIV Screen 4th Generation wRfx: NONREACTIVE

## 2020-05-29 LAB — HEPATITIS C ANTIBODY: Hep C Virus Ab: <0.1 s/co ratio (ref 0.0–0.9)

## 2020-06-17 ENCOUNTER — Ambulatory Visit: Payer: Managed Care, Other (non HMO) | Admitting: Urology

## 2020-06-21 ENCOUNTER — Other Ambulatory Visit: Payer: Self-pay | Admitting: *Deleted

## 2020-06-21 ENCOUNTER — Other Ambulatory Visit
Admission: RE | Admit: 2020-06-21 | Discharge: 2020-06-21 | Disposition: A | Discharge status: Home / Self Care | Payer: Managed Care, Other (non HMO) | Attending: Urology | Admitting: Urology

## 2020-06-21 ENCOUNTER — Other Ambulatory Visit: Payer: Self-pay

## 2020-06-21 DIAGNOSIS — N138 Other obstructive and reflux uropathy: Secondary | ICD-10-CM

## 2020-06-21 DIAGNOSIS — N401 Enlarged prostate with lower urinary tract symptoms: Secondary | ICD-10-CM | POA: Insufficient documentation

## 2020-06-21 LAB — PSA: Prostatic Specific Antigen: 0.63 ng/mL (ref 0.00–4.00)

## 2020-06-23 NOTE — Progress Notes (Signed)
3:15 PM   Benjamin Ford 1961/02/23 [id number]  Referring provider: Malva Limes, MD [address] [place],  [place] [address]  Chief Complaint  Patient presents with  . Follow-up    follow-up, IPSS, PSA    HPI: Benjamin Ford is a 59 y.o. year old male with a strong family history of PCa, with his brother and father both being diagnosed with PCa, and BPH with LUTS who presents today for a 6 month follow up.    His father was diagnosed Ford the age of 21 with PCa and his brother Ford the age of 36.  His brother recently underwent a prostatectomy.    His other brother has been followed for a rising PSA and now has been diagnosed with prostate cancer.  He underwent a prostatectomy as well.     He has no complaints Ford this visit.  Patient denies any gross hematuria, dysuria or suprapubic/flank pain.  Patient denies any fevers, chills, nausea or vomiting.   I PSS 0/0   IPSS    Row Name 06/24/20 1500         International Prostate Symptom Score   How often have you had the sensation of not emptying your bladder? Not Ford All     How often have you had to urinate less than every two hours? Not Ford All     How often have you found you stopped and started again several times when you urinated? Not Ford All     How often have you found it difficult to postpone urination? Not Ford All     How often have you had a weak urinary stream? Not Ford All     How often have you had to strain to start urination? Not Ford All     How many times did you typically get up Ford night to urinate? None     Total IPSS Score 0       Quality of Life due to urinary symptoms   If you were to spend the rest of your life with your urinary condition just the way it is now how would you feel about that? Delighted            Score:  1-7 Mild 8-19 Moderate 20-35 Severe  PMH: Past Medical History:  Diagnosis Date  . Anxiety   . Asthma   . BPH (benign prostatic hyperplasia)   . GERD  (gastroesophageal reflux disease)   . HTN (hypertension)   . Insomnia   . Palpitations   . Stomach ulcer   . Testicular mass     Surgical History: Past Surgical History:  Procedure Laterality Date  . COLONOSCOPY WITH PROPOFOL N/A 05/27/2020   Procedure: COLONOSCOPY WITH PROPOFOL;  Surgeon: Benjamin Mood, MD;  Location: [facility] ENDOSCOPY;  Service: Gastroenterology;  Laterality: N/A;  . INGUINAL HERNIA REPAIR  2008  . MASTECTOMY Bilateral 1992/1981  . VASECTOMY  1983    Home Medications:  Allergies as of 06/24/2020   No Known Allergies     Medication List       Accurate as of June 24, 2020  3:15 PM. If you have any questions, ask your nurse or doctor.        STOP taking these medications   cyclobenzaprine 5 MG tablet Commonly known as: FLEXERIL Stopped by: Benjamin Carreto, PA-C   famotidine 20 MG tablet Commonly known as: PEPCID Stopped by: Benjamin Cowboy, PA-C     TAKE these  medications   albuterol 108 (90 Base) MCG/ACT inhaler Commonly known as: VENTOLIN HFA Inhale 2 puffs into the lungs every 6 (six) hours as needed for wheezing or shortness of breath.   triamcinolone cream 0.1 % Commonly known as: KENALOG Apply 1 application topically 2 (two) times daily.       Allergies: No Known Allergies  Family History: Family History  Problem Relation Age of Onset  . Prostate cancer Father   . Cancer Father        bone, prostate  . COPD Mother   . Cancer Mother        lung  . Cancer Maternal Grandmother        brain  . Aneurysm Paternal Grandmother   . Prostate cancer Brother   . Prostate cancer Brother   . Kidney disease Neg Hx   . Bladder Cancer Neg Hx     Social History:  reports that he has quit smoking. He has never used smokeless tobacco. He reports current alcohol use. He reports that he does not use drugs.  ROS: For pertinent review of systems please refer to history of present illness  Physical Exam: BP (!) 183/98   Pulse 67   Ht 5\' 6"  (1.676  m)   Wt 168 lb (76.2 kg)   BMI 27.12 kg/m   Constitutional:  Well nourished. Alert and oriented, No acute distress. HEENT: Benjamin Ford, mask in place.  Trachea midline Cardiovascular: No clubbing, cyanosis, or edema. Respiratory: Normal respiratory effort, no increased work of breathing. GI: Abdomen is soft, non tender, non distended, no abdominal masses. Liver and spleen not palpable.  No hernias appreciated.  Stool sample for occult testing is not indicated.   GU: No CVA tenderness.  No bladder fullness or masses.  Patient with circumcised phallus.  Urethral meatus is patent.  No penile discharge. No penile lesions or rashes. Scrotum without lesions, cysts, rashes and/or edema.  Testicles are located scrotally bilaterally. No masses are appreciated in the testicles. Left and right epididymis are normal. Rectal: Patient with  normal sphincter tone. Anus and perineum without scarring or rashes. No rectal masses are appreciated. Prostate is approximately 50 grams, could only palpate the apex and the midportion of the gland, no nodules are appreciated. Seminal vesicles could not be palpated.   Skin: No rashes, bruises or suspicious lesions. Lymph: No inguinal adenopathy. Neurologic: Grossly intact, no focal deficits, moving all 4 extremities. Psychiatric: Normal Ford and affect.   Laboratory Data: Previous PSA's:     0.6 ng/mL on 06/26/2014     0.7 ng/mL on 12/27/2014     0.8 ng/mL on 07/20/2016  Results for orders placed or performed in visit on 01/16/16  PSA  Result Value Ref Range   Prostate Specific Ag, Serum 0.7 0.0 - 4.0 ng/mL   PSA  0.8 ng/mL on 01/25/2017 PSA   0.9 ng/mL on 07/29/2017 PSA  1.0 ng/mL on 01/25/2018 PSA  0.6 ng/mL on 08/03/2018  Component     Latest Ref Rng & Units 06/05/2019 12/07/2019 06/21/2020  Prostatic Specific Antigen     0.00 - 4.00 ng/mL 0.75 0.65 0.63   I have reviewed the labs.   Assessment & Plan  1. BPH with LUTS Continue conservative management,  avoiding bladder irritants and timed voiding's RTC in 6 months for IPSS, PSA and exam   2. Family history of prostate cancer:   Patient's father and now two brothers were both diagnosed with prostate cancer in their 40's.  We will continue to monitor every 6 months with PSA and DRE.   Return in about 6 months (around 12/25/2020) for IPSS, PSA and exam.  Kashay Cavenaugh, PA-C

## 2020-06-24 ENCOUNTER — Ambulatory Visit: Payer: Managed Care, Other (non HMO) | Admitting: Urology

## 2020-06-24 ENCOUNTER — Other Ambulatory Visit: Payer: Self-pay

## 2020-06-24 ENCOUNTER — Encounter: Payer: Self-pay | Admitting: Urology

## 2020-06-24 VITALS — BP 183/98 | HR 67 | Ht 66.0 in | Wt 168.0 lb

## 2020-06-24 DIAGNOSIS — N401 Enlarged prostate with lower urinary tract symptoms: Secondary | ICD-10-CM

## 2020-06-24 DIAGNOSIS — Z8042 Family history of malignant neoplasm of prostate: Secondary | ICD-10-CM | POA: Diagnosis not present

## 2020-06-24 DIAGNOSIS — N138 Other obstructive and reflux uropathy: Secondary | ICD-10-CM

## 2020-11-20 ENCOUNTER — Encounter: Payer: Self-pay | Admitting: Physician Assistant

## 2020-11-20 ENCOUNTER — Ambulatory Visit: Payer: Managed Care, Other (non HMO) | Admitting: Physician Assistant

## 2020-11-20 ENCOUNTER — Other Ambulatory Visit: Payer: Self-pay

## 2020-11-20 VITALS — BP 178/98 | HR 74 | Temp 98.4°F | Wt 171.5 lb

## 2020-11-20 DIAGNOSIS — H9193 Unspecified hearing loss, bilateral: Secondary | ICD-10-CM

## 2020-11-20 DIAGNOSIS — I10 Essential (primary) hypertension: Secondary | ICD-10-CM

## 2020-11-20 MED ORDER — AMLODIPINE BESYLATE 10 MG PO TABS: 10.0000 mg | ORAL_TABLET | Freq: Every day | ORAL | 0 refills | Status: DC

## 2020-11-20 NOTE — Progress Notes (Signed)
Established patient visit   Patient: Benjamin Ford   DOB: 24-Nov-1961   59 y.o. Male  MRN: [id number] Visit Date: 11/20/2020  Today's healthcare provider: Trey Sailors, PA-C   Chief Complaint  Patient presents with  . Hypertension  Benjamin,Benjamin Ford M Benjamin Ford,acting as a scribe for [employer], PA-C.,have documented all relevant documentation on the behalf of Benjamin Sailors, PA-C,as directed by  Benjamin Sailors, PA-C while in the presence of Benjamin Sailors, PA-C.  Subjective    HPI  Hypertension, follow-up  BP Readings from Last 3 Encounters:  11/20/20 (!) 178/98  06/24/20 (!) 183/98  05/28/20 (!) 148/96   Wt Readings from Last 3 Encounters:  11/20/20 171 lb 8 oz (77.8 kg)  06/24/20 168 lb (76.2 kg)  05/28/20 168 lb (76.2 kg)     He was last seen for hypertension 6 months ago.  BP at that visit was 148/96. Management since that visit includes dietary and lifestyle changes.    He is following a Regular, Low Sodium diet. He is exercising. He does not smoke.  Use of agents associated with hypertension: none.   Outside blood pressures are 140's/80's-90's. Symptoms: No chest pain No chest pressure  No palpitations No syncope  No dyspnea No orthopnea  No paroxysmal nocturnal dyspnea No lower extremity edema   Pertinent labs: Lab Results  Component Value Date   CHOL 202 (H) 05/28/2020   HDL 88 05/28/2020   LDLCALC 101 (H) 05/28/2020   TRIG 74 05/28/2020   CHOLHDL 2.3 05/28/2020   Lab Results  Component Value Date   NA 140 05/28/2020   K 3.9 05/28/2020   CREATININE 0.91 05/28/2020   GFRNONAA 91 05/28/2020   GFRAA 106 05/28/2020   GLUCOSE 83 05/28/2020     The 10-year ASCVD risk score Benjamin Ford DC Jr., et al., 2013) is: 11.9%   ---------------------------------------------------------------------------------------------------      Medications: Outpatient Medications Prior to Visit  Medication Sig  . albuterol (PROVENTIL HFA;VENTOLIN HFA) 108  (90 Base) MCG/ACT inhaler Inhale 2 puffs into the lungs every 6 (six) hours as needed for wheezing or shortness of breath.  . triamcinolone cream (KENALOG) 0.1 % Apply 1 application topically 2 (two) times daily.   No Ford-administered medications prior to visit.    Review of Systems  Constitutional: Negative.   Respiratory: Negative.   Cardiovascular: Negative.   Neurological: Negative.   Hematological: Negative.       Objective    BP (!) 178/98 (BP Location: Left Arm, Patient Position: Sitting, Cuff Size: Normal)   Pulse 74   Temp 98.4 F (36.9 C) (Oral)   Wt 171 lb 8 oz (77.8 kg)   SpO2 100%   BMI 27.68 kg/m    Physical Exam Constitutional:      Appearance: Normal appearance. He is obese.  Cardiovascular:     Rate and Rhythm: Normal rate and regular rhythm.     Heart sounds: Normal heart sounds.  Pulmonary:     Effort: Pulmonary effort is normal.     Breath sounds: Normal breath sounds.  Skin:    General: Skin is warm and dry.  Neurological:     General: No focal deficit present.     Mental Status: He is alert and oriented to person, place, and time. Mental status is at baseline.  Psychiatric:        Mood and Affect: Mood normal.        Behavior: Behavior normal.  No results found for any visits on 11/20/20.  Assessment & Plan    1. Primary hypertension  Start below with 5 mg daily for one week and then 10 mg daily on ward. Follow up one month.   - amLODipine (NORVASC) 10 MG tablet; Take 1 tablet (10 mg total) by mouth daily.  Dispense: 90 tablet; Refill: 0  2. Bilateral hearing loss, unspecified hearing loss type  - Ambulatory referral to ENT   Return in about 4 weeks (around 12/18/2020).      ITrey Sailors, PA-C, have reviewed all documentation for this visit. The documentation on 11/20/20 for the exam, diagnosis, procedures, and orders are all accurate and complete.  The entirety of the information documented in the History of  Present Illness, Review of Systems and Physical Exam were personally obtained by me. Portions of this information were initially documented by Benjamin Ford and reviewed by me for thoroughness and accuracy.     Benjamin Ford  Benjamin Ford [phone] (phone) [phone] (fax)  [facility]

## 2020-11-20 NOTE — Patient Instructions (Signed)

## 2020-11-27 ENCOUNTER — Ambulatory Visit: Payer: Self-pay | Admitting: Physician Assistant

## 2020-12-01 ENCOUNTER — Emergency Department
Admission: EM | Admit: 2020-12-01 | Discharge: 2020-12-01 | Disposition: A | Discharge status: Home / Self Care | Payer: Managed Care, Other (non HMO) | Attending: Emergency Medicine | Admitting: Emergency Medicine

## 2020-12-01 ENCOUNTER — Emergency Department: Payer: Managed Care, Other (non HMO)

## 2020-12-01 ENCOUNTER — Encounter: Payer: Self-pay | Admitting: Emergency Medicine

## 2020-12-01 ENCOUNTER — Other Ambulatory Visit: Payer: Self-pay

## 2020-12-01 DIAGNOSIS — Z87891 Personal history of nicotine dependence: Secondary | ICD-10-CM | POA: Diagnosis not present

## 2020-12-01 DIAGNOSIS — Z79899 Other long term (current) drug therapy: Secondary | ICD-10-CM | POA: Insufficient documentation

## 2020-12-01 DIAGNOSIS — Z20822 Contact with and (suspected) exposure to covid-19: Secondary | ICD-10-CM

## 2020-12-01 DIAGNOSIS — R059 Cough, unspecified: Secondary | ICD-10-CM | POA: Diagnosis present

## 2020-12-01 DIAGNOSIS — U071 COVID-19: Secondary | ICD-10-CM | POA: Diagnosis not present

## 2020-12-01 DIAGNOSIS — I10 Essential (primary) hypertension: Secondary | ICD-10-CM | POA: Insufficient documentation

## 2020-12-01 DIAGNOSIS — J45901 Unspecified asthma with (acute) exacerbation: Secondary | ICD-10-CM | POA: Diagnosis not present

## 2020-12-01 LAB — RESP PANEL BY RT-PCR (FLU A&B, COVID) ARPGX2
Influenza A by PCR: NEGATIVE
Influenza B by PCR: NEGATIVE
SARS Coronavirus 2 by RT PCR: POSITIVE — AB

## 2020-12-01 MED ORDER — BENZONATATE 100 MG PO CAPS
100.0000 mg | ORAL_CAPSULE | Freq: Three times a day (TID) | ORAL | 0 refills | Status: DC | PRN
Start: 2020-12-01 — End: 2021-01-16

## 2020-12-01 MED ORDER — ALBUTEROL SULFATE HFA 108 (90 BASE) MCG/ACT IN AERS
2.0000 | INHALATION_SPRAY | Freq: Four times a day (QID) | RESPIRATORY_TRACT | 0 refills | Status: DC | PRN
Start: 2020-12-01 — End: 2021-10-01

## 2020-12-01 MED ORDER — BENZONATATE 100 MG PO CAPS
100.0000 mg | ORAL_CAPSULE | Freq: Once | ORAL | Status: AC
  Administered 2020-12-01: 100 mg via ORAL
  Filled 2020-12-01: qty 1

## 2020-12-01 NOTE — ED Notes (Signed)
Pt called by Clinical research associate to inform of Lab result. COVID +. Pt provided with CDC recommendations and instructions for continued follow up care.

## 2020-12-01 NOTE — ED Notes (Signed)
See triage note. Cough and fever noted. NAD. Ambulatory to room.

## 2020-12-01 NOTE — ED Triage Notes (Signed)
Pt ambulatory to triage with c/o cough and fever since last night.  States he took an at home COVID test and it was positive.  NAD noted at this time.

## 2020-12-02 NOTE — ED Provider Notes (Signed)
[facility] Emergency Department Provider Note  ___________________________________________   Event Date/Time   First MD Initiated Contact with Patient 12/01/20 1911     (approximate)  I have reviewed the triage vital signs and the nursing notes.   HISTORY  Chief Complaint Fever  HPI Benjamin Ford is a 59 y.o. male who reports to the emergency department for evaluation of cough and fever that began last night.  Patient states T-max around 101.  The patient has been Covid vaccinated with 2 doses of Pfizer vaccine.  States that he took a home Covid test which was positive.  He denies any shortness of breath, chest pain, abdominal symptoms, body aches.       Past Medical History:  Diagnosis Date  . Anxiety   . Asthma   . BPH (benign prostatic hyperplasia)   . GERD (gastroesophageal reflux disease)   . HTN (hypertension)   . Insomnia   . Palpitations   . Stomach ulcer   . Testicular mass     Patient Active Problem List   Diagnosis Date Noted  . BPH with obstruction/lower urinary tract symptoms 07/23/2015  . Family history of prostate cancer 07/23/2015  . Breathlessness on exertion 07/19/2015  . 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 05/25/2014  . Asthma with exacerbation 05/27/2010  . Cannot sleep 12/06/2007  . Acid reflux 07/26/2007  . Awareness of heartbeats 04/04/2007  . Anxiety disorder 01/03/2007    Past Surgical History:  Procedure Laterality Date  . COLONOSCOPY WITH PROPOFOL N/A 05/27/2020   Procedure: COLONOSCOPY WITH PROPOFOL;  Surgeon: Wyline Mood, MD;  Location: [facility] ENDOSCOPY;  Service: Gastroenterology;  Laterality: N/A;  . INGUINAL HERNIA REPAIR  2008  . MASTECTOMY Bilateral 1992/1981  . VASECTOMY  1983    Prior to Admission medications   Medication Sig Start Date End Date Taking? Authorizing Provider  albuterol (VENTOLIN HFA) 108 (90 Base) MCG/ACT inhaler Inhale 2 puffs into the lungs every 6 (six) hours as needed for wheezing or shortness of  breath. 12/01/20   Lucy Chris, PA  amLODipine (NORVASC) 10 MG tablet Take 1 tablet (10 mg total) by mouth daily. 11/20/20   Trey Sailors, PA-C  benzonatate (TESSALON PERLES) 100 MG capsule Take 1 capsule (100 mg total) by mouth 3 (three) times daily as needed for cough. 12/01/20 12/01/21  Lucy Chris, PA  triamcinolone cream (KENALOG) 0.1 % Apply 1 application topically 2 (two) times daily. 08/24/19   Trey Sailors, PA-C    Allergies Patient has no known allergies.  Family History  Problem Relation Age of Onset  . Prostate cancer Father   . Cancer Father        bone, prostate  . COPD Mother   . Cancer Mother        lung  . Cancer Maternal Grandmother        brain  . Aneurysm Paternal Grandmother   . Prostate cancer Brother   . Prostate cancer Brother   . Kidney disease Neg Hx   . Bladder Cancer Neg Hx     Social History Social History   Tobacco Use  . Smoking status: Former Games developer  . Smokeless tobacco: Never Used  . Tobacco comment: smoked <1  ppd off and on from mid 20s to mid 40s.   Vaping Use  . Vaping Use: Never used  Substance Use Topics  . Alcohol use: Yes    Alcohol/week: 0.0 standard drinks    Comment: moderate  . Drug use: No  Review of Systems Constitutional: + fever/chills Eyes: No visual changes. ENT: No sore throat. Cardiovascular: Denies chest pain. Respiratory: + Cough, denies shortness of breath. Gastrointestinal: No abdominal pain.  No nausea, no vomiting.  No diarrhea.  No constipation. Genitourinary: Negative for dysuria. Musculoskeletal: Negative for back pain. Skin: Negative for rash. Neurological: Negative for headaches, focal weakness or numbness.  ____________________________________________   PHYSICAL EXAM:  VITAL SIGNS: ED Triage Vitals  Enc Vitals Group     BP 12/01/20 1626 (!) 156/84     Pulse Rate 12/01/20 1626 85     Resp 12/01/20 1626 18     Temp 12/01/20 1626 98.9 F (37.2 C)     Temp Source  12/01/20 1626 Oral     SpO2 12/01/20 1626 96 %     Weight 12/01/20 1627 164 lb (74.4 kg)     Height 12/01/20 1627 5\' 6"  (1.676 m)     Head Circumference --      Peak Flow --      Pain Score 12/01/20 1626 0     Pain Loc --      Pain Edu? --      Excl. in GC? --     Constitutional: Alert and oriented. Well appearing and in no acute distress. Eyes: Conjunctivae are normal. PERRL. EOMI. Head: Atraumatic. Nose: No congestion/rhinnorhea. Mouth/Throat: Mucous membranes are moist.  Neck: No stridor.   Lymphatic: No cervical lymphadenopathy Cardiovascular: Normal rate, regular rhythm. Grossly normal heart sounds.  Good peripheral circulation. Respiratory: Normal respiratory effort.  No retractions.  Lungs with coarse breath sounds throughout, no wheezes, crackles or rhonchi appreciated. Gastrointestinal: Soft and nontender. No distention. No abdominal bruits. No CVA tenderness. Musculoskeletal: No lower extremity tenderness nor edema.  No joint effusions. Neurologic:  Normal speech and language. No gross focal neurologic deficits are appreciated. No gait instability. Skin:  Skin is warm, dry and intact. No rash noted. Psychiatric: Mood and affect are normal. Speech and behavior are normal.  ____________________________________________   LABS (all labs ordered are listed, but only abnormal results are displayed)  Labs Reviewed  RESP PANEL BY RT-PCR (FLU A&B, COVID) ARPGX2 - Abnormal; Notable for the following components:      Result Value   SARS Coronavirus 2 by RT PCR POSITIVE (*)    All other components within normal limits   ____________________________________________  RADIOLOGY I, 12/03/20, personally viewed and evaluated these images (plain radiographs) as part of my medical decision making, as well as reviewing the written report by the radiologist.  ED provider interpretation: No focal pneumonia  Official radiology report(s): DG Chest Portable 1 View  Result  Date: 12/01/2020 CLINICAL DATA:  Cough, fever, COVID positive EXAM: PORTABLE CHEST 1 VIEW COMPARISON:  10/31/2018 FINDINGS: No consolidation, features of edema, pneumothorax, or effusion. Pulmonary vascularity is normally distributed. The cardiomediastinal contours are unremarkable. No acute osseous or soft tissue abnormality. IMPRESSION: No acute cardiopulmonary abnormality. Electronically Signed   By: 11/02/2018 M.D.   On: 12/01/2020 20:06    ____________________________________________   INITIAL IMPRESSION / ASSESSMENT AND PLAN / ED COURSE  As part of my medical decision making, I reviewed the following data within the electronic MEDICAL RECORD NUMBER Nursing notes reviewed and incorporated, Radiograph reviewed and Notes from prior ED visits        Patient is a 59 year old male who presents emergency department for concern of Covid.  The patient has not been around any known Covid positive individuals, and was fully vaccinated however after  beginning with cough and fever last night, he took a home Covid test which was positive.  Overall, the patient looks generally well, physical exam grossly unremarkable except for some coarse breath sounds in the bilateral lungs.  Chest x-ray reveals no focal pneumonia to suggest bacterial involvement.  Respiratory panel was also obtained and is positive for Covid but negative for flu.  Given the patient's short duration of symptoms thus far, this is likely attributed to Covid.  Discussed symptomatic treatment with the patient.  The patient does intermittently use an inhaler at home and is close to being out, so this was refilled as well as Lawyer for the patient's cough.  He is amenable with this plan is stable at time for outpatient therapy.  He was instructed on return precautions.     ____________________________________________   FINAL CLINICAL IMPRESSION(S) / ED DIAGNOSES  Final diagnoses:  Suspected COVID-19 virus infection     ED  Discharge Orders         Ordered    albuterol (VENTOLIN HFA) 108 (90 Base) MCG/ACT inhaler  Every 6 hours PRN        12/01/20 1939    benzonatate (TESSALON PERLES) 100 MG capsule  3 times daily PRN        12/01/20 2013          *Please note:  Benjamin Ford was evaluated in Emergency Department on 12/02/2020 for the symptoms described in the history of present illness. He was evaluated in the context of the global COVID-19 pandemic, which necessitated consideration that the patient might be at risk for infection with the SARS-CoV-2 virus that causes COVID-19. Institutional protocols and algorithms that pertain to the evaluation of patients at risk for COVID-19 are in a state of rapid change based on information released by regulatory bodies including the CDC and federal and state organizations. These policies and algorithms were followed during the patient's care in the ED.  Some ED evaluations and interventions may be delayed as a result of limited staffing during and the pandemic.*   Note:  This document was prepared using Dragon voice recognition software and may include unintentional dictation errors.    Lucy Chris, PA 12/02/20 1532    [place], MD 12/02/20 [phone]

## 2020-12-22 NOTE — Progress Notes (Deleted)
9:09 AM   Benjamin Ford 1961/09/05 [id number]  Referring provider: Trey Sailors, PA-C [address] [place],  [place] [address]  No chief complaint on file.  Urological History 1. Family history of prostate cancer - father and brother diagnosed in their 64's - PSA pending   2. BPH - ~ 50 cc by DRE  HPI: Benjamin Ford is a 60 y.o. male who presents today for 6 month follow up.    PMH: Past Medical History:  Diagnosis Date  . Anxiety   . Asthma   . BPH (benign prostatic hyperplasia)   . GERD (gastroesophageal reflux disease)   . HTN (hypertension)   . Insomnia   . Palpitations   . Stomach ulcer   . Testicular mass     Surgical History: Past Surgical History:  Procedure Laterality Date  . COLONOSCOPY WITH PROPOFOL N/A 05/27/2020   Procedure: COLONOSCOPY WITH PROPOFOL;  Surgeon: Wyline Mood, MD;  Location: [facility] ENDOSCOPY;  Service: Gastroenterology;  Laterality: N/A;  . INGUINAL HERNIA REPAIR  2008  . MASTECTOMY Bilateral 1992/1981  . VASECTOMY  1983    Home Medications:  Allergies as of 12/23/2020   No Known Allergies     Medication List       Accurate as of December 22, 2020  9:09 AM. If you have any questions, ask your nurse or doctor.        albuterol 108 (90 Base) MCG/ACT inhaler Commonly known as: VENTOLIN HFA Inhale 2 puffs into the lungs every 6 (six) hours as needed for wheezing or shortness of breath.   amLODipine 10 MG tablet Commonly known as: NORVASC Take 1 tablet (10 mg total) by mouth daily.   benzonatate 100 MG capsule Commonly known as: Tessalon Perles Take 1 capsule (100 mg total) by mouth 3 (three) times daily as needed for cough.   triamcinolone 0.1 % Commonly known as: KENALOG Apply 1 application topically 2 (two) times daily.       Allergies: No Known Allergies  Family History: Family History  Problem Relation Age of Onset  . Prostate cancer Father   . Cancer Father        bone, prostate  .  COPD Mother   . Cancer Mother        lung  . Cancer Maternal Grandmother        brain  . Aneurysm Paternal Grandmother   . Prostate cancer Brother   . Prostate cancer Brother   . Kidney disease Neg Hx   . Bladder Cancer Neg Hx     Social History:  reports that he has quit smoking. He has never used smokeless tobacco. He reports current alcohol use. He reports that he does not use drugs.  ROS: For pertinent review of systems please refer to history of present illness  Physical Exam: There were no vitals taken for this visit.  Constitutional:  Well nourished. Alert and oriented, No acute distress. HEENT: [place] AT, moist mucus membranes.  Trachea midline Cardiovascular: No clubbing, cyanosis, or edema. Respiratory: Normal respiratory effort, no increased work of breathing. GI: Abdomen is soft, non tender, non distended, no abdominal masses. Liver and spleen not palpable.  No hernias appreciated.  Stool sample for occult testing is not indicated.   GU: No CVA tenderness.  No bladder fullness or masses.  Patient with circumcised/uncircumcised phallus. ***Foreskin easily retracted***  Urethral meatus is patent.  No penile discharge. No penile lesions or rashes. Scrotum without lesions, cysts, rashes  and/or edema.  Testicles are located scrotally bilaterally. No masses are appreciated in the testicles. Left and right epididymis are normal. Rectal: Patient with  normal sphincter tone. Anus and perineum without scarring or rashes. No rectal masses are appreciated. Prostate is approximately *** grams, *** nodules are appreciated. Seminal vesicles are normal. Skin: No rashes, bruises or suspicious lesions. Lymph: No inguinal adenopathy. Neurologic: Grossly intact, no focal deficits, moving all 4 extremities. Psychiatric: Normal mood and affect.   Laboratory Data: Previous PSA's:     0.6 ng/mL on 06/26/2014     0.7 ng/mL on 12/27/2014     0.8 ng/mL on 07/20/2016  Results for orders placed or  performed in visit on 01/16/16  PSA  Result Value Ref Range   Prostate Specific Ag, Serum 0.7 0.0 - 4.0 ng/mL   PSA  0.8 ng/mL on 01/25/2017 PSA   0.9 ng/mL on 07/29/2017 PSA  1.0 ng/mL on 01/25/2018 PSA  0.6 ng/mL on 08/03/2018  Component     Latest Ref Rng & Units 06/05/2019 12/07/2019 06/21/2020  Prostatic Specific Antigen     0.00 - 4.00 ng/mL 0.75 0.65 0.63   I have reviewed the labs.   Assessment & Plan  1. BPH  Continue conservative management, avoiding bladder irritants and timed voiding's Most bothersome symptoms is/are *** Initiate alpha-blocker (***), discussed side effects *** Initiate 5 alpha reductase inhibitor (***), discussed side effects *** Continue tamsulosin 0.4 mg daily, alfuzosin 10 mg daily, Rapaflo 8 mg daily, terazosin, doxazosin, Cialis 5 mg daily and finasteride 5 mg daily, dutasteride 0.5 mg daily***:refills given Cannot tolerate medication or medication failure, schedule cystoscopy *** RTC in *** months for IPSS, PSA, PVR and exam   2. Family history of prostate cancer - father and brother both diagnosed with prostate cancer's in their 66's    2. Family history of prostate cancer:   Patient's father and now two brothers were both diagnosed with prostate cancer in their 10's.   We will continue to monitor every 6 months with PSA and DRE.   No follow-ups on file.  Rori Goar, PA-C

## 2020-12-23 ENCOUNTER — Ambulatory Visit: Payer: Managed Care, Other (non HMO) | Admitting: Urology

## 2020-12-23 DIAGNOSIS — N400 Enlarged prostate without lower urinary tract symptoms: Secondary | ICD-10-CM

## 2020-12-23 DIAGNOSIS — Z8042 Family history of malignant neoplasm of prostate: Secondary | ICD-10-CM

## 2020-12-24 ENCOUNTER — Ambulatory Visit: Payer: Self-pay | Admitting: Physician Assistant

## 2021-01-06 ENCOUNTER — Other Ambulatory Visit: Payer: Self-pay

## 2021-01-06 ENCOUNTER — Other Ambulatory Visit: Payer: Self-pay | Admitting: *Deleted

## 2021-01-06 ENCOUNTER — Ambulatory Visit: Payer: Managed Care, Other (non HMO) | Admitting: Urology

## 2021-01-06 ENCOUNTER — Other Ambulatory Visit
Admission: RE | Admit: 2021-01-06 | Discharge: 2021-01-06 | Disposition: A | Discharge status: Home / Self Care | Payer: Managed Care, Other (non HMO) | Attending: Urology | Admitting: Urology

## 2021-01-06 ENCOUNTER — Encounter: Payer: Self-pay | Admitting: Urology

## 2021-01-06 VITALS — BP 153/91 | HR 94 | Ht 66.0 in | Wt 170.0 lb

## 2021-01-06 DIAGNOSIS — N138 Other obstructive and reflux uropathy: Secondary | ICD-10-CM | POA: Diagnosis not present

## 2021-01-06 DIAGNOSIS — N401 Enlarged prostate with lower urinary tract symptoms: Secondary | ICD-10-CM | POA: Diagnosis not present

## 2021-01-06 DIAGNOSIS — Z8042 Family history of malignant neoplasm of prostate: Secondary | ICD-10-CM

## 2021-01-06 LAB — URINALYSIS, COMPLETE (UACMP) WITH MICROSCOPIC
Bacteria, UA: NONE SEEN
Bilirubin Urine: NEGATIVE
Glucose, UA: NEGATIVE mg/dL
Hgb urine dipstick: NEGATIVE
Ketones, ur: NEGATIVE mg/dL
Leukocytes,Ua: NEGATIVE
Nitrite: NEGATIVE
Protein, ur: NEGATIVE mg/dL
Specific Gravity, Urine: 1.02 (ref 1.005–1.030)
WBC, UA: NONE SEEN WBC/hpf (ref 0–5)
pH: 6.5 (ref 5.0–8.0)

## 2021-01-06 NOTE — Progress Notes (Signed)
3:21 PM   Benjamin Ford [birth year] [id number]  Referring provider: Trey Sailors, PA-C [address] [place],  [place] [address]  Chief Complaint  Patient presents with  . Follow-up    follow-up   Urological History 1. Family history of prostate cancer - father and brother diagnosed in their 27's - PSA pending   2. BPH - I PSS 0/0 - ~ 50 cc by DRE  HPI: Benjamin Ford is a 60 y.o. male who presents today for 6 month follow up.   He has no complaints at this time.  Patient denies any modifying or aggravating factors.  Patient denies any gross hematuria, dysuria or suprapubic/flank pain.  Patient denies any fevers, chills, nausea or vomiting.   UA is benign.    PMH: Past Medical History:  Diagnosis Date  . Anxiety   . Asthma   . BPH (benign prostatic hyperplasia)   . GERD (gastroesophageal reflux disease)   . HTN (hypertension)   . Insomnia   . Palpitations   . Stomach ulcer   . Testicular mass     Surgical History: Past Surgical History:  Procedure Laterality Date  . COLONOSCOPY WITH PROPOFOL N/A 05/27/2020   Procedure: COLONOSCOPY WITH PROPOFOL;  Surgeon: Wyline Mood, MD;  Location: [facility] ENDOSCOPY;  Service: Gastroenterology;  Laterality: N/A;  . INGUINAL HERNIA REPAIR  2008  . MASTECTOMY Bilateral 1992/1981  . VASECTOMY  1983    Home Medications:  Allergies as of 01/06/2021   No Known Allergies     Medication List       Accurate as of January 06, 2021  3:21 PM. If you have any questions, ask your nurse or doctor.        albuterol 108 (90 Base) MCG/ACT inhaler Commonly known as: VENTOLIN HFA Inhale 2 puffs into the lungs every 6 (six) hours as needed for wheezing or shortness of breath.   amLODipine 10 MG tablet Commonly known as: NORVASC Take 1 tablet (10 mg total) by mouth daily.   benzonatate 100 MG capsule Commonly known as: Tessalon Perles Take 1 capsule (100 mg total) by mouth 3 (three) times daily as needed  for cough.   triamcinolone 0.1 % Commonly known as: KENALOG Apply 1 application topically 2 (two) times daily.       Allergies: No Known Allergies  Family History: Family History  Problem Relation Age of Onset  . Prostate cancer Father   . Cancer Father        bone, prostate  . COPD Mother   . Cancer Mother        lung  . Cancer Maternal Grandmother        brain  . Aneurysm Paternal Grandmother   . Prostate cancer Brother   . Prostate cancer Brother   . Kidney disease Neg Hx   . Bladder Cancer Neg Hx     Social History:  reports that he has quit smoking. He has never used smokeless tobacco. He reports current alcohol use. He reports that he does not use drugs.  ROS: For pertinent review of systems please refer to history of present illness  Physical Exam: BP (!) 153/91   Pulse 94   Ht 5\' 6"  (1.676 m)   Wt 170 lb (77.1 kg)   BMI 27.44 kg/m   Constitutional:  Well nourished. Alert and oriented, No acute distress. HEENT: [place] AT, mask in place.  Trachea midline Cardiovascular: No clubbing, cyanosis, or edema. Respiratory: Normal respiratory  effort, no increased work of breathing. GU: No CVA tenderness.  No bladder fullness or masses.  Patient with circumcised phallus. Urethral meatus is patent.  No penile discharge. No penile lesions or rashes. Scrotum without lesions, cysts, rashes and/or edema.  Testicles are located scrotally bilaterally. No masses are appreciated in the testicles. Left and right epididymis are normal.  Right spermatocele is noted. 3 cm in size   Rectal: Patient with  normal sphincter tone. Anus and perineum without scarring or rashes. No rectal masses are appreciated. Prostate is approximately 50 grams, could only palpate the apex and the midportion of the gland, no nodules are appreciated. Seminal vesicles could not be palpated.  Lymph: No inguinal adenopathy. Neurologic: Grossly intact, no focal deficits, moving all 4 extremities. Psychiatric: Normal  mood and affect.   Laboratory Data: Previous PSA's:     0.6 ng/mL on 06/26/2014     0.7 ng/mL on 12/27/2014     0.8 ng/mL on 07/20/2016  Results for orders placed or performed in visit on 01/16/16  PSA  Result Value Ref Range   Prostate Specific Ag, Serum 0.7 0.0 - 4.0 ng/mL   PSA  0.8 ng/mL on 01/25/2017 PSA   0.9 ng/mL on 07/29/2017 PSA  1.0 ng/mL on 01/25/2018 PSA  0.6 ng/mL on 08/03/2018  Component     Latest Ref Rng & Units 06/05/2019 12/07/2019 06/21/2020  Prostatic Specific Antigen     0.00 - 4.00 ng/mL 0.75 0.65 0.63   Urinalysis Component     Latest Ref Rng & Units 01/06/2021  Color, Urine     YELLOW YELLOW  Appearance     CLEAR CLEAR  Specific Gravity, Urine     1.005 - 1.030 1.020  pH     5.0 - 8.0 6.5  Glucose, UA     NEGATIVE mg/dL NEGATIVE  Hgb urine dipstick     NEGATIVE NEGATIVE  Bilirubin Urine     NEGATIVE NEGATIVE  Ketones, ur     NEGATIVE mg/dL NEGATIVE  Protein     NEGATIVE mg/dL NEGATIVE  Nitrite     NEGATIVE NEGATIVE  Leukocytes,Ua     NEGATIVE NEGATIVE  Squamous Epithelial / LPF     0 - 5 0-5  WBC, UA     0 - 5 WBC/hpf NONE SEEN  RBC / HPF     0 - 5 RBC/hpf 0-5  Bacteria, UA     NONE SEEN NONE SEEN   I have reviewed the labs.   Assessment & Plan  1. BPH  I PSS 0/0 Continue conservative management, avoiding bladder irritants and timed voiding's RTC in 6 months for IPSS, PSA and exam   2. Family history of prostate cancer - father and brother both diagnosed with prostate cancer's in their 40's  - We will continue to monitor every 6 months with PSA and DRE.   Return in about 6 months (around 07/06/2021) for IPSS, PSA and exam.  Reisa Coppola, PA-C

## 2021-01-07 ENCOUNTER — Ambulatory Visit (INDEPENDENT_AMBULATORY_CARE_PROVIDER_SITE_OTHER): Payer: Managed Care, Other (non HMO) | Admitting: Physician Assistant

## 2021-01-07 VITALS — BP 133/87 | HR 90 | Temp 97.5°F | Wt 175.2 lb

## 2021-01-07 DIAGNOSIS — Z8616 Personal history of COVID-19: Secondary | ICD-10-CM | POA: Diagnosis not present

## 2021-01-07 DIAGNOSIS — I10 Essential (primary) hypertension: Secondary | ICD-10-CM | POA: Diagnosis not present

## 2021-01-07 LAB — PSA: Prostatic Specific Antigen: 0.71 ng/mL (ref 0.00–4.00)

## 2021-01-07 NOTE — Patient Instructions (Signed)

## 2021-01-07 NOTE — Progress Notes (Signed)
Established patient visit   Patient: Benjamin Ford   DOB: 23-Apr-1961   60 y.o. Male  MRN: [id number] Visit Date: 01/07/2021  Today's healthcare provider: Trey Sailors, PA-C   Chief Complaint  Patient presents with  . Hypertension  I,Porsha C McClurkin,acting as a scribe for [employer], PA-C.,have documented all relevant documentation on the behalf of Trey Sailors, PA-C,as directed by  Trey Sailors, PA-C while in the presence of Trey Sailors, PA-C.  Subjective    HPI  Hypertension, follow-up  BP Readings from Last 3 Encounters:  01/07/21 133/87  01/06/21 (!) 153/91  12/01/20 (!) 156/84   Wt Readings from Last 3 Encounters:  01/07/21 175 lb 3.2 oz (79.5 kg)  01/06/21 170 lb (77.1 kg)  12/01/20 164 lb (74.4 kg)     He was last seen for hypertension 1 months ago.  BP at that visit was 178/98. Management since that visit includes started with 5 mg daily for one week and then 10 mg daily on ward. Marland Kitchen  He reports good compliance with treatment. He is not having side effects.  He is following a Regular diet. He is exercising. He does not smoke.  Use of agents associated with hypertension: none.   Outside blood pressures are:130's-140's/70's-90's  Symptoms: No chest pain No chest pressure  No palpitations No syncope  No dyspnea No orthopnea  No paroxysmal nocturnal dyspnea No lower extremity edema   Pertinent labs: Lab Results  Component Value Date   CHOL 202 (H) 05/28/2020   HDL 88 05/28/2020   LDLCALC 101 (H) 05/28/2020   TRIG 74 05/28/2020   CHOLHDL 2.3 05/28/2020   Lab Results  Component Value Date   NA 140 05/28/2020   K 3.9 05/28/2020   CREATININE 0.91 05/28/2020   GFRNONAA 91 05/28/2020   GFRAA 106 05/28/2020   GLUCOSE 83 05/28/2020     The 10-year ASCVD risk score Denman George DC Jr., et al., 2013) is: 7.2%   ---------------------------------------------------------------------------------------------------       Medications: Outpatient Medications Prior to Visit  Medication Sig  . albuterol (VENTOLIN HFA) 108 (90 Base) MCG/ACT inhaler Inhale 2 puffs into the lungs every 6 (six) hours as needed for wheezing or shortness of breath.  Marland Kitchen amLODipine (NORVASC) 10 MG tablet Take 1 tablet (10 mg total) by mouth daily.  . benzonatate (TESSALON PERLES) 100 MG capsule Take 1 capsule (100 mg total) by mouth 3 (three) times daily as needed for cough.  . triamcinolone cream (KENALOG) 0.1 % Apply 1 application topically 2 (two) times daily.   No facility-administered medications prior to visit.    Review of Systems  Constitutional: Negative.   Respiratory: Negative.   Cardiovascular: Negative.   Hematological: Negative.        Objective    BP 133/87 (BP Location: Left Arm, Patient Position: Sitting, Cuff Size: Large)   Pulse 90   Temp (!) 97.5 F (36.4 C) (Oral)   Wt 175 lb 3.2 oz (79.5 kg)   SpO2 100%   BMI 28.28 kg/m     Physical Exam Constitutional:      Appearance: Normal appearance.  Cardiovascular:     Rate and Rhythm: Normal rate and regular rhythm.     Heart sounds: Normal heart sounds.  Pulmonary:     Effort: Pulmonary effort is normal.     Breath sounds: Normal breath sounds.  Skin:    General: Skin is warm and dry.  Neurological:  General: No focal deficit present.     Mental Status: He is alert and oriented to person, place, and time. Mental status is at baseline.  Psychiatric:        Mood and Affect: Mood normal.        Behavior: Behavior normal.       No results found for any visits on 01/07/21.  Assessment & Plan    1. Primary hypertension  BP a little on the higher end, may need another medication. Will observe for now and f/u with CPE and chronic in 6/22/20221. Continue amlodipine 10 mg daily for now.   2. History of COVID-19  Patient feeling better.    No follow-ups on file.      ITrey Sailors, PA-C, have reviewed all documentation for this  visit. The documentation on 01/07/21 for the exam, diagnosis, procedures, and orders are all accurate and complete.  The entirety of the information documented in the History of Present Illness, Review of Systems and Physical Exam were personally obtained by me. Portions of this information were initially documented by [facility] and reviewed by me for thoroughness and accuracy.     [name]  [facility] [phone] (phone) [phone] (fax)  [facility]

## 2021-01-16 ENCOUNTER — Telehealth (INDEPENDENT_AMBULATORY_CARE_PROVIDER_SITE_OTHER): Payer: Managed Care, Other (non HMO) | Admitting: Physician Assistant

## 2021-01-16 DIAGNOSIS — Z8616 Personal history of COVID-19: Secondary | ICD-10-CM | POA: Diagnosis not present

## 2021-01-16 DIAGNOSIS — R059 Cough, unspecified: Secondary | ICD-10-CM | POA: Diagnosis not present

## 2021-01-16 MED ORDER — PROMETHAZINE-DM 6.25-15 MG/5ML PO SYRP: 5.0000 mL | ORAL_SOLUTION | Freq: Every evening | ORAL | 0 refills | Status: DC | PRN

## 2021-01-16 NOTE — Progress Notes (Signed)
MyChart Video Visit    Virtual Visit via Video Note   This visit type was conducted due to national recommendations for restrictions regarding the COVID-19 Pandemic (e.g. social distancing) in an effort to limit this patient's exposure and mitigate transmission in our community. This patient is at least at moderate risk for complications without adequate follow up. This format is felt to be most appropriate for this patient at this time. Physical exam was limited by quality of the video and audio technology used for the visit.   Patient location: Home Provider location: Office   I discussed the limitations of evaluation and management by telemedicine and the availability of in person appointments. The patient expressed understanding and agreed to proceed.  Patient: Benjamin Ford   DOB: Oct 22, 1961   60 y.o. Male  MRN: [id number] Visit Date: 01/16/2021  Today's healthcare provider: Trey Sailors, PA-C   Chief Complaint  Patient presents with  . Cough   Subjective    HPI   Cough: Patient complains of cough . Cough is productive with yellow mucus. Pt says cough originally began when he had covid and tested positive on December 27th. Pt says cough is intermittently lingering but has arisen again the past3 days. Associated symptoms include:N/A. Patient does have a history of asthma. Patient does not have a history of environmental allergens. Patient has not recent travel. Patient does have a history of smoking. Patient  has previous Chest X-ray. Pt says he is taking tesslon perls, Mucinex, Nyquil.  Reports he is feeling better today. He has used his albuterol inhaler three times per day which does seem to help him.      Medications: Outpatient Medications Prior to Visit  Medication Sig  . albuterol (VENTOLIN HFA) 108 (90 Base) MCG/ACT inhaler Inhale 2 puffs into the lungs every 6 (six) hours as needed for wheezing or shortness of breath.  Marland Kitchen amLODipine (NORVASC) 10 MG tablet  Take 1 tablet (10 mg total) by mouth daily.  . calcipotriene (DOVONOX) 0.005 % cream Apply topically 2 (two) times daily.  Marland Kitchen triamcinolone cream (KENALOG) 0.1 % Apply 1 application topically 2 (two) times daily.  . [DISCONTINUED] benzonatate (TESSALON PERLES) 100 MG capsule Take 1 capsule (100 mg total) by mouth 3 (three) times daily as needed for cough.   No facility-administered medications prior to visit.    Review of Systems  Respiratory: Positive for cough.       Objective    There were no vitals taken for this visit.   Physical Exam Constitutional:      Appearance: Normal appearance.  Pulmonary:     Effort: Pulmonary effort is normal. No respiratory distress.  Neurological:     Mental Status: He is alert.  Psychiatric:        Mood and Affect: Mood normal.        Behavior: Behavior normal.        Assessment & Plan    1. History of COVID-19  - promethazine-dextromethorphan (PROMETHAZINE-DM) 6.25-15 MG/5ML syrup; Take 5 mLs by mouth at bedtime as needed.  Dispense: 118 mL; Refill: 0  2. Cough  - promethazine-dextromethorphan (PROMETHAZINE-DM) 6.25-15 MG/5ML syrup; Take 5 mLs by mouth at bedtime as needed.  Dispense: 118 mL; Refill: 0   Return if symptoms worsen or fail to improve.     I discussed the assessment and treatment plan with the patient. The patient was provided an opportunity to ask questions and all were answered. The patient agreed with the  plan and demonstrated an understanding of the instructions.   The patient was advised to call back or seek an in-person evaluation if the symptoms worsen or if the condition fails to improve as anticipated.   ITrey Sailors, PA-C, have reviewed all documentation for this visit. The documentation on 01/17/21 for the exam, diagnosis, procedures, and orders are all accurate and complete.   [name] [facility] [phone] (phone) [phone] (fax)  [facility]

## 2021-01-24 ENCOUNTER — Telehealth: Payer: Self-pay

## 2021-01-24 MED ORDER — PREDNISONE 10 MG PO TABS
ORAL_TABLET | ORAL | 0 refills | Status: AC
Start: 2021-01-24 — End: 2021-01-30

## 2021-01-24 MED ORDER — AZITHROMYCIN 250 MG PO TABS: ORAL_TABLET | ORAL | 0 refills | Status: AC

## 2021-01-24 NOTE — Telephone Encounter (Signed)
If having any chest pain, hi fevers, or shortness of breath, then he needs to go to ER. Otherwise can take prednisone and zpack. [facility].

## 2021-01-24 NOTE — Telephone Encounter (Signed)
Please advise. Benjamin Ford is out of the office today.

## 2021-01-24 NOTE — Telephone Encounter (Signed)
Patient advised. He denies any symptoms of chest pain, high fevers or shortness of breath.

## 2021-01-24 NOTE — Telephone Encounter (Signed)
Copied from CRM [id number]. Topic: General - Other >> Jan 24, 2021  9:37 AM Gaetana Michaelis A wrote: Reason for CRM: Patient had a visit with PCP on 01/16/21  Patient shares that cough is worsening, patient is hearing a rattling and coughing up mucus  Patient is finding it increasingly difficult to sleep  Patient would like to be contacted in regards to solutions or suggestions  Please contact to advise

## 2021-02-11 ENCOUNTER — Telehealth (INDEPENDENT_AMBULATORY_CARE_PROVIDER_SITE_OTHER): Payer: Managed Care, Other (non HMO) | Admitting: Physician Assistant

## 2021-02-11 DIAGNOSIS — R059 Cough, unspecified: Secondary | ICD-10-CM

## 2021-02-11 NOTE — Progress Notes (Signed)
MyChart Video Visit    Virtual Visit via Video Note   This visit type was conducted due to national recommendations for restrictions regarding the COVID-19 Pandemic (e.g. social distancing) in an effort to limit this patient's exposure and mitigate transmission in our community. This patient is at least at moderate risk for complications without adequate follow up. This format is felt to be most appropriate for this patient at this time. Physical exam was limited by quality of the video and audio technology used for the visit.   Patient location: Home Provider location: Office   I discussed the limitations of evaluation and management by telemedicine and the availability of in person appointments. The patient expressed understanding and agreed to proceed.  Patient: Benjamin Ford   DOB: 26-Oct-1961   60 y.o. Male  MRN: [id number] Visit Date: 02/11/2021  Today's healthcare provider: Trey Sailors, PA-C   Chief Complaint  Patient presents with  . Cough  I,[name],acting as a scribe for Trey Sailors, PA-C.,have documented all relevant documentation on the behalf of Trey Sailors, PA-C,as directed by  Trey Sailors, PA-C while in the presence of Trey Sailors, PA-C.  Subjective    Cough This is a recurrent problem. The current episode started 1 to 4 weeks ago. The problem has been gradually worsening. The cough is productive of sputum. Associated symptoms include shortness of breath. Pertinent negatives include no chest pain, headaches, nasal congestion, postnasal drip or wheezing. The symptoms are aggravated by lying down. He has tried ipratropium inhaler and OTC cough suppressant for the symptoms. The treatment provided mild relief.    Reports that he has persistent cough. This has been persistent for the past few months. He did have COVID in 11/2020. Has used some phenergan DM in the last month but cough has persisted nevertheless. He is using his albuterol  daily which he reports has been somewhat helpful. Does feel some congestion and drainage down the back of his throat.    Medications: Outpatient Medications Prior to Visit  Medication Sig  . albuterol (VENTOLIN HFA) 108 (90 Base) MCG/ACT inhaler Inhale 2 puffs into the lungs every 6 (six) hours as needed for wheezing or shortness of breath.  Marland Kitchen amLODipine (NORVASC) 10 MG tablet Take 1 tablet (10 mg total) by mouth daily.  . calcipotriene (DOVONOX) 0.005 % cream Apply topically 2 (two) times daily.  Marland Kitchen triamcinolone cream (KENALOG) 0.1 % Apply 1 application topically 2 (two) times daily.  . [DISCONTINUED] promethazine-dextromethorphan (PROMETHAZINE-DM) 6.25-15 MG/5ML syrup Take 5 mLs by mouth at bedtime as needed. (Patient not taking: Reported on 02/11/2021)   No facility-administered medications prior to visit.    Review of Systems  HENT: Negative for postnasal drip.   Respiratory: Positive for cough and shortness of breath. Negative for wheezing.   Cardiovascular: Negative for chest pain.  Neurological: Negative for headaches.      Objective    There were no vitals taken for this visit.   Physical Exam Constitutional:      Appearance: Normal appearance.  Pulmonary:     Effort: Pulmonary effort is normal. No respiratory distress.  Neurological:     Mental Status: He is alert.  Psychiatric:        Mood and Affect: Mood normal.        Behavior: Behavior normal.        Assessment & Plan    1. Cough  Counseled about chronic causes of cough including asthma, PND,  and GERD. He will try 2nd generation antihistamine daily and see if this brings about improvement. If not can consider trial of inhaler or acid reducer.     Return if symptoms worsen or fail to improve.     I discussed the assessment and treatment plan with the patient. The patient was provided an opportunity to ask questions and all were answered. The patient agreed with the plan and demonstrated an understanding  of the instructions.   The patient was advised to call back or seek an in-person evaluation if the symptoms worsen or if the condition fails to improve as anticipated.  ITrey Sailors, PA-C, have reviewed all documentation for this visit. The documentation on 02/12/21 for the exam, diagnosis, procedures, and orders are all accurate and complete.  The entirety of the information documented in the History of Present Illness, Review of Systems and Physical Exam were personally obtained by me. Portions of this information were initially documented by [name] and reviewed by me for thoroughness and accuracy.    [name] [facility] [phone] (phone) [phone] (fax)  [facility]

## 2021-02-11 NOTE — Patient Instructions (Signed)

## 2021-03-18 NOTE — Progress Notes (Signed)
Acute Office Visit  Subjective:    Patient ID: Benjamin Ford, male    DOB: [birth year], 60 y.o.   MRN: [id number]  No chief complaint on file.   HPI Patient is in today for continued allergies, worse when outside.  Patient was last seen on 02/11/21 for cough thought to possibly due to asthma, GERD or allergies.  Advised use of antihistamines and acid reducer.  Mornings has brown/ and green sputum since December.  Patient states that he was diagnosed with Covid in December 25th was tested postive and since that time he has had difficulty with cough.  He states it is productive of dark brown sputum in the morning when he first gets up, and then greenish in the evening and after being outside.   He has also developed some dyspnea on exertion with wheezing.  Mainly after he mows the lawn.  He has no know history of allergies but describes symptoms of such when outside. He denies any chest pain but describes chest tightness when he is having some of the episodes of coughing, congestion and wheezing.  He has been taking Tagamet twice daily and Xyzal in the evening around 3 months and prior was taking zyrtec.   .  Would like to discuss possibly changing to something he could take during the day.    It is of note on an unrelated topic; patient has been without his Amlodipine for about 3 weeks.  It is refilled today.  Past Medical History:  Diagnosis Date  . Anxiety   . Asthma   . BPH (benign prostatic hyperplasia)   . GERD (gastroesophageal reflux disease)   . HTN (hypertension)   . Insomnia   . Palpitations   . Stomach ulcer   . Testicular mass     Past Surgical History:  Procedure Laterality Date  . COLONOSCOPY WITH PROPOFOL N/A 05/27/2020   Procedure: COLONOSCOPY WITH PROPOFOL;  Surgeon: Wyline Mood, MD;  Location: [facility] ENDOSCOPY;  Service: Gastroenterology;  Laterality: N/A;  . INGUINAL HERNIA REPAIR  2008  . MASTECTOMY Bilateral 1992/1981  . VASECTOMY  1983    Family History   Problem Relation Age of Onset  . Prostate cancer Father   . Cancer Father        bone, prostate  . COPD Mother   . Cancer Mother        lung  . Cancer Maternal Grandmother        brain  . Aneurysm Paternal Grandmother   . Prostate cancer Brother   . Prostate cancer Brother   . Kidney disease Neg Hx   . Bladder Cancer Neg Hx     Social History   Socioeconomic History  . Marital status: Married    Spouse name: Not on file  . Number of children: Not on file  . Years of education: Not on file  . Highest education level: Not on file  Occupational History  . Not on file  Tobacco Use  . Smoking status: Former Games developer  . Smokeless tobacco: Never Used  . Tobacco comment: smoked <1  ppd off and on from mid 20s to mid 40s.   Vaping Use  . Vaping Use: Never used  Substance and Sexual Activity  . Alcohol use: Yes    Alcohol/week: 0.0 standard drinks    Comment: moderate  . Drug use: No  . Sexual activity: Not on file  Other Topics Concern  . Not on file  Social History Narrative  . Not  on file   Social Determinants of Health   Financial Resource Strain: Not on file  Food Insecurity: Not on file  Transportation Needs: Not on file  Physical Activity: Not on file  Stress: Not on file  Social Connections: Not on file  Intimate Partner Violence: Not on file    Outpatient Medications Prior to Visit  Medication Sig Dispense Refill  . albuterol (VENTOLIN HFA) 108 (90 Base) MCG/ACT inhaler Inhale 2 puffs into the lungs every 6 (six) hours as needed for wheezing or shortness of breath. 8 g 0  . calcipotriene (DOVONOX) 0.005 % cream Apply topically 2 (two) times daily.    Marland Kitchen triamcinolone cream (KENALOG) 0.1 % Apply 1 application topically 2 (two) times daily. 30 g 0  . amLODipine (NORVASC) 10 MG tablet Take 1 tablet (10 mg total) by mouth daily. 90 tablet 0   No facility-administered medications prior to visit.    No Known Allergies  Review of Systems  Constitutional:  Negative for chills, diaphoresis and fever.  HENT: Positive for rhinorrhea. Negative for congestion, ear discharge, ear pain, postnasal drip, sinus pressure and sinus pain.   Respiratory: Positive for cough, chest tightness, shortness of breath and wheezing.        Objective:    Physical Exam  BP (!) 162/88 (BP Location: Right Arm, Patient Position: Sitting, Cuff Size: Normal)   Pulse (!) 57   Temp 98.6 F (37 C) (Oral)   Wt 166 lb (75.3 kg)   SpO2 100%   BMI 26.79 kg/m  Wt Readings from Last 3 Encounters:  03/19/21 166 lb (75.3 kg)  01/07/21 175 lb 3.2 oz (79.5 kg)  01/06/21 170 lb (77.1 kg)    Health Maintenance Due  Topic Date Due  . COVID-19 Vaccine (3 - Booster for Pfizer series) 09/11/2020    There are no preventive care reminders to display for this patient.   Lab Results  Component Value Date   TSH 1.620 05/28/2020   Lab Results  Component Value Date   WBC 6.0 03/19/2021   HGB 15.0 03/19/2021   HCT 44.5 03/19/2021   MCV 96 03/19/2021   PLT 238 03/19/2021   Lab Results  Component Value Date   NA 143 03/19/2021   K 5.2 03/19/2021   CO2 22 03/19/2021   GLUCOSE 91 03/19/2021   BUN 17 03/19/2021   CREATININE 0.98 03/19/2021   BILITOT 0.5 03/19/2021   ALKPHOS 46 03/19/2021   AST 27 03/19/2021   ALT 37 03/19/2021   PROT 7.0 03/19/2021   ALBUMIN 4.9 03/19/2021   CALCIUM 9.7 03/19/2021   Lab Results  Component Value Date   CHOL 202 (H) 05/28/2020   Lab Results  Component Value Date   HDL 88 05/28/2020   Lab Results  Component Value Date   LDLCALC 101 (H) 05/28/2020   Lab Results  Component Value Date   TRIG 74 05/28/2020   Lab Results  Component Value Date   CHOLHDL 2.3 05/28/2020   No results found for: HGBA1C     Assessment & Plan:   Problem List Items Addressed This Visit      Respiratory   Asthma with exacerbation   Relevant Medications   predniSONE (STERAPRED UNI-PAK 21 TAB) 10 MG (21) TBPK tablet   Fluticasone-Salmeterol  (ADVAIR) 100-50 MCG/DOSE AEPB   Other Relevant Orders   DG Chest 2 View (Completed)    Other Visit Diagnoses    Seasonal allergies    -  Primary   Relevant  Medications   fluticasone (FLONASE) 50 MCG/ACT nasal spray   Primary hypertension       Relevant Medications   amLODipine (NORVASC) 10 MG tablet   Bacterial infection       Relevant Orders   DG Chest 2 View (Completed)   Post covid-19 condition, unspecified       Relevant Orders   CBC with Differential/Platelet (Completed)   Comprehensive Metabolic Panel (CMET) (Completed)   DG Chest 2 View (Completed)   Cough       Relevant Medications   chlorpheniramine-HYDROcodone (TUSSIONEX PENNKINETIC ER) 10-8 MG/5ML SUER       Meds ordered this encounter  Medications  . amLODipine (NORVASC) 10 MG tablet    Sig: Take 1 tablet (10 mg total) by mouth daily.    Dispense:  90 tablet    Refill:  0  . predniSONE (STERAPRED UNI-PAK 21 TAB) 10 MG (21) TBPK tablet    Sig: PO: Take 6 tablets on day 1:Take 5 tablets day 2:Take 4 tablets day 3: Take 3 tablets day 4:Take 2 tablets day five: 5 Take 1 tablet day 6    Dispense:  21 tablet    Refill:  0  . Fluticasone-Salmeterol (ADVAIR) 100-50 MCG/DOSE AEPB    Sig: Inhale 1 puff into the lungs 2 (two) times daily.    Dispense:  1 each    Refill:  1  . amoxicillin-clavulanate (AUGMENTIN) 875-125 MG tablet    Sig: Take 1 tablet by mouth 2 (two) times daily.    Dispense:  20 tablet    Refill:  0  . chlorpheniramine-HYDROcodone (TUSSIONEX PENNKINETIC ER) 10-8 MG/5ML SUER    Sig: Take 5 mLs by mouth at bedtime as needed for cough (will cause drowsiness. take as needed only.).    Dispense:  115 mL    Refill:  0  . fluticasone (FLONASE) 50 MCG/ACT nasal spray    Sig: Place 2 sprays into both nostrils daily.    Dispense:  16 g    Refill:  6   Red Flags discussed. The patient was given clear instructions to go to ER or return to medical center if any red flags develop, symptoms do not improve,  worsen or new problems develop. They verbalized understanding.  Return if symptoms worsen or fail to improve, for at any time for any worsening symptoms, Go to Emergency room/ urgent care if worse.   The entirety of the information documented in the History of Present Illness, Review of Systems and Physical Exam were personally obtained by me. Portions of this information were initially documented by the CMA and reviewed by me for thoroughness and accuracy.    Jairo Ben, FNP

## 2021-03-19 ENCOUNTER — Other Ambulatory Visit: Payer: Self-pay

## 2021-03-19 ENCOUNTER — Ambulatory Visit
Admission: RE | Admit: 2021-03-19 | Discharge: 2021-03-19 | Disposition: A | Discharge status: Home / Self Care | Payer: Managed Care, Other (non HMO) | Source: Ambulatory Visit | Attending: Adult Health | Admitting: Adult Health

## 2021-03-19 ENCOUNTER — Ambulatory Visit: Payer: Managed Care, Other (non HMO) | Admitting: Adult Health

## 2021-03-19 ENCOUNTER — Ambulatory Visit
Admission: RE | Admit: 2021-03-19 | Discharge: 2021-03-19 | Disposition: A | Discharge status: Home / Self Care | Payer: Managed Care, Other (non HMO) | Attending: Adult Health | Admitting: Adult Health

## 2021-03-19 VITALS — BP 162/88 | HR 57 | Temp 98.6°F | Wt 166.0 lb

## 2021-03-19 DIAGNOSIS — J302 Other seasonal allergic rhinitis: Secondary | ICD-10-CM | POA: Diagnosis not present

## 2021-03-19 DIAGNOSIS — R059 Cough, unspecified: Secondary | ICD-10-CM

## 2021-03-19 DIAGNOSIS — J45901 Unspecified asthma with (acute) exacerbation: Secondary | ICD-10-CM

## 2021-03-19 DIAGNOSIS — A499 Bacterial infection, unspecified: Secondary | ICD-10-CM | POA: Diagnosis not present

## 2021-03-19 DIAGNOSIS — U099 Post covid-19 condition, unspecified: Secondary | ICD-10-CM | POA: Insufficient documentation

## 2021-03-19 DIAGNOSIS — I10 Essential (primary) hypertension: Secondary | ICD-10-CM | POA: Diagnosis not present

## 2021-03-19 MED ORDER — HYDROCOD POLST-CPM POLST ER 10-8 MG/5ML PO SUER: 5.0000 mL | Freq: Every evening | ORAL | 0 refills | Status: DC | PRN

## 2021-03-19 MED ORDER — FLUTICASONE PROPIONATE 50 MCG/ACT NA SUSP: 2.0000 | Freq: Every day | NASAL | 6 refills | Status: DC

## 2021-03-19 MED ORDER — AMOXICILLIN-POT CLAVULANATE 875-125 MG PO TABS: 1.0000 | ORAL_TABLET | Freq: Two times a day (BID) | ORAL | 0 refills | Status: DC

## 2021-03-19 MED ORDER — PREDNISONE 10 MG (21) PO TBPK: ORAL_TABLET | ORAL | 0 refills | Status: DC

## 2021-03-19 MED ORDER — AMLODIPINE BESYLATE 10 MG PO TABS: 10.0000 mg | ORAL_TABLET | Freq: Every day | ORAL | 0 refills | Status: DC

## 2021-03-19 MED ORDER — FLUTICASONE-SALMETEROL 100-50 MCG/DOSE IN AEPB: 1.0000 | INHALATION_SPRAY | Freq: Two times a day (BID) | RESPIRATORY_TRACT | 1 refills | Status: DC

## 2021-03-19 NOTE — Progress Notes (Signed)
Chest x ray is within normal limits, continue with treatment plan discussed in office and if you are not improving in 3 to 5 days return to the office for follow up.

## 2021-03-19 NOTE — Patient Instructions (Signed)
Can switch to allegra daily to see if any improvement in allergy symptoms.  Suspect bacterial infection causing cough take all of antibiotics and steroid. .    Amoxicillin; Clavulanic Acid Tablets What is this medicine? AMOXICILLIN; CLAVULANIC ACID (a mox i SIL in; KLAV yoo lan ic AS id) is a penicillin antibiotic. It treats some infections caused by bacteria. It will not work for colds, the flu, or other viruses. This medicine may be used for other purposes; ask your health care provider or pharmacist if you have questions. COMMON BRAND NAME(S): Augmentin What should I tell my health care provider before I take this medicine? They need to know if you have any of these conditions:  kidney disease  liver disease  mononucleosis  stomach or intestine problems such as colitis  an unusual or allergic reaction to amoxicillin, other penicillin or cephalosporin antibiotics, clavulanic acid, other medicines, foods, dyes, or preservatives  pregnant or trying to get pregnant  breast-feeding How should I use this medicine? Take this drug by mouth. Take it as directed on the prescription label at the same time every day. Take it with food at the start of a meal or snack. Take all of this drug unless your health care provider tells you to stop it early. Keep taking it even if you think you are better. Talk to your health care provider about the use of this drug in children. While it may be prescribed for selected conditions, precautions do apply. Overdosage: If you think you have taken too much of this medicine contact a poison control center or emergency room at once. NOTE: This medicine is only for you. Do not share this medicine with others. What if I miss a dose? If you miss a dose, take it as soon as you can. If it is almost time for your next dose, take only that dose. Do not take double or extra doses. What may interact with this medicine?  allopurinol  anticoagulants  birth control  pills  methotrexate  probenecid This list may not describe all possible interactions. Give your health care provider a list of all the medicines, herbs, non-prescription drugs, or dietary supplements you use. Also tell them if you smoke, drink alcohol, or use illegal drugs. Some items may interact with your medicine. What should I watch for while using this medicine? Tell your health care provider if your symptoms do not start to get better or if they get worse. This medicine may cause serious skin reactions. They can happen weeks to months after starting the medicine. Contact your health care provider right away if you notice fevers or flu-like symptoms with a rash. The rash may be red or purple and then turn into blisters or peeling of the skin. Or, you might notice a red rash with swelling of the face, lips or lymph nodes in your neck or under your arms. Do not treat diarrhea with over the counter products. Contact your health care provider if you have diarrhea that lasts more than 2 days or if it is severe and watery. If you have diabetes, you may get a false-positive result for sugar in your urine. Check with your health care provider. Birth control may not work properly while you are taking this medicine. Talk to your health care provider about using an extra method of birth control. What side effects may I notice from receiving this medicine? Side effects that you should report to your doctor or health care provider as soon as possible:  allergic reactions (skin rash, itching or hives; swelling of the face, lips, or tongue)  bloody or watery diarrhea  dark urine  infection (fever, chills, cough, sore throat, or pain)  kidney injury (trouble passing urine or change in the amount of urine)  redness, blistering, peeling, or loosening of the skin, including inside the mouth  seizures  thrush (white patches in the mouth or mouth sores)  trouble breathing  unusual bruising or  bleeding  unusually weak or tired Side effects that usually do not require medical attention (report to your doctor or health care provider if they continue or are bothersome):  diarrhea  dizziness  headache  nausea, vomiting  unusual vaginal discharge, itching, or odor  upset stomach This list may not describe all possible side effects. Call your doctor for medical advice about side effects. You may report side effects to FDA at 1-800-FDA-1088. Where should I keep my medicine? Keep out of the reach of children and pets. Store at room temperature between 20 and 25 degrees C (68 and 77 degrees F). Throw away any unused drug after the expiration date. NOTE: This sheet is a summary. It may not cover all possible information. If you have questions about this medicine, talk to your doctor, pharmacist, or health care provider.  2021 Elsevier/Gold Standard (2020-10-16 09:45:03) Fluticasone; Salmeterol inhalation aerosol- rinse mouth after use.  What is this medicine? FLUTICASONE; SALMETEROL (floo TIK a sone; sal ME te role) is a combination of 2 medicines to treat asthma and COPD. Salmeterol is a bronchodilator that helps keep airways open. Fluticasone decreases inflammation in the lungs. Do not use these medicines for acute asthma attacks or bronchospasm. This medicine may be used for other purposes; ask your health care provider or pharmacist if you have questions. COMMON BRAND NAME(S): Advair HFA What should I tell my health care provider before I take this medicine? They need to know if you have any of these conditions:  diabetes (high blood sugar)  eye disease, such as glaucoma, cataracts, or blurred vision  heart disease  high blood pressure  immune system problems  infections, such as tuberculosis (TB) or other bacterial, fungal, or viral infections  irregular heartbeat or rhythm  liver disease  osteoporosis, weak bones  seizures  taking other steroids like  dexamethasone or prednisone  thyroid disease  an unusual or allergic reaction to fluticasone, salmeterol, other corticosteroids, other medicines, foods, dyes, or preservatives  pregnant or trying to get pregnant  breast-feeding How should I use this medicine? This medicine is inhaled through the mouth. Rinse your mouth with water after use. Make sure not to swallow the water. Take it as directed on the prescription label at the same time every day. Do not use it more often than directed. This medicine comes with INSTRUCTIONS FOR USE. Ask your pharmacist for directions on how to use this medicine. Read the information carefully. Talk to your pharmacist or health care provider if you have questions. Talk to your health care provider about the use of this medicine in children. While it may be prescribed for children as young as 12 for selected conditions, precautions do apply. Overdosage: If you think you have taken too much of this medicine contact a poison control center or emergency room at once. NOTE: This medicine is only for you. Do not share this medicine with others. What if I miss a dose? If you miss a dose, use it as soon as you can. If it is almost time for your  next dose, use only that dose. Do not use double or extra doses. What may interact with this medicine? Do not take this medicine with any of the following medications:  MAOIs like Carbex, Eldepryl, Marplan, Nardil, and Parnate This medicine may also interact with the following medications:  aminophylline or theophylline  antiviral medicines for HIV or AIDS  beta-blockers like metoprolol and propranolol  certain antibiotics like clarithromycin, erythromycin, levofloxacin, linezolid, and telithromycin  certain medicines for fungal infections like ketoconazole, itraconazole, posaconazole, voriconazole  conivaptan  diuretics  medicines for colds  medicines for depression or emotional  conditions  nefazodone  vaccines This list may not describe all possible interactions. Give your health care provider a list of all the medicines, herbs, non-prescription drugs, or dietary supplements you use. Also tell them if you smoke, drink alcohol, or use illegal drugs. Some items may interact with your medicine. What should I watch for while using this medicine? Visit your health care provider for regular checks on your progress. Tell your health care provider if your symptoms do not start to get better or if they get worse. Talk to your health care provider about how to treat an acute asthma attack or bronchospasm (wheezing). Be sure to always have a short-acting inhaler with you. If you use your short-acting inhaler and your symptoms do not get better or if they get worse, call your health care provider right away. You and your health care provider should develop an Asthma Action Plan that is just for you. Be sure to know what to do if you are in the yellow (asthma is getting worse) or red (medical alert) zones. This medicine can worsen breathing or cause wheezing right after you use it. Be sure you have a short-acting inhaler for acute attacks (wheezing) nearby. If this happens, stop using this medicine right away and call your health care provider. This medicine may increase your risk of dying from asthma-related problems. Talk to your health care provider if you have questions. This medicine may increase your risk of getting an infection. Call your health care provider for advice if you get a fever, chills, sore throat, or other symptoms of a cold or flu. Do not treat yourself. Try to avoid being around people who are sick. If you have not had the measles or chickenpox vaccines, tell your health care provider right away if you are around someone with these viruses. This medicine may slow your child's growth if it is taken for a long time at high doses. Your health care provider will monitor  your child's growth. Using this medicine for a long time may weaken your bones. The risk of bone fractures may be increased. Talk to your health care provider about your bone health. This medicine may increase blood sugar. Ask your health care provider if changes in diet or medicines are needed if you have diabetes. Do not treat yourself for coughs, colds or allergies without asking your health care provider for advice. Some nonprescription medicines can affect this one. What side effects may I notice from receiving this medicine? Side effects that you should report to your doctor or health care professional as soon as possible:  allergic reactions (skin rash, itching or hives; swelling of the face, lips, or tongue)  changes in vision  chest pain  fast, irregular heartbeat  high blood sugar (increased hunger, thirst or urination; unusually weak or tired, blurry vision)  increase in blood pressure  infection (fever, chills, cough, sore throat, pain or  trouble passing urine)  low adrenal gland function (nausea; vomiting; loss of appetite; unusually weak or tired; dizziness; low blood pressure)  thrush (white patches in the mouth or mouth sores)  trouble breathing Side effects that usually do not require medical attention (report to your doctor or health care professional if they continue or are bothersome):  changes in taste  cough  headache  hoarseness, sore throat  tremors This list may not describe all possible side effects. Call your doctor for medical advice about side effects. You may report side effects to FDA at 1-800-FDA-1088. Where should I keep my medicine? Keep out of the reach of children and pets. Store at room temperature between 20 and 25 degrees C (68 and 77 degrees F). Keep inhaler away from extreme heat. Get rid of it when the dose counter reads "000" or after the expiration date, whichever is first. NOTE: This sheet is a summary. It may not cover all  possible information. If you have questions about this medicine, talk to your doctor, pharmacist, or health care provider.  2021 Elsevier/Gold Standard (2019-09-14 12:59:37) Cough, Adult A cough helps to clear your throat and lungs. A cough may be a sign of an illness or another medical condition. An acute cough may only last 2-3 weeks, while a chronic cough may last 8 or more weeks. Many things can cause a cough. They include:  Germs (viruses or bacteria) that attack the airway.  Breathing in things that bother (irritate) your lungs.  Allergies.  Asthma.  Mucus that runs down the back of your throat (postnasal drip).  Smoking.  Acid backing up from the stomach into the tube that moves food from the mouth to the stomach (gastroesophageal reflux).  Some medicines.  Lung problems.  Other medical conditions, such as heart failure or a blood clot in the lung (pulmonary embolism). Follow these instructions at home: Medicines  Take over-the-counter and prescription medicines only as told by your doctor.  Talk with your doctor before you take medicines that stop a cough (cough suppressants). Lifestyle  Do not smoke, and try not to be around smoke. Do not use any products that contain nicotine or tobacco, such as cigarettes, e-cigarettes, and chewing tobacco. If you need help quitting, ask your doctor.  Drink enough fluid to keep your pee (urine) pale yellow.  Avoid caffeine.  Do not drink alcohol if your doctor tells you not to drink.   General instructions  Watch for any changes in your cough. Tell your doctor about them.  Always cover your mouth when you cough.  Stay away from things that make you cough, such as perfume, candles, campfire smoke, or cleaning products.  If the air is dry, use a cool mist vaporizer or humidifier in your home.  If your cough is worse at night, try using extra pillows to raise your head up higher while you sleep.  Rest as needed.  Keep  all follow-up visits as told by your doctor. This is important.   Contact a doctor if:  You have new symptoms.  You cough up pus.  Your cough does not get better after 2-3 weeks, or your cough gets worse.  Cough medicine does not help your cough and you are not sleeping well.  You have pain that gets worse or pain that is not helped with medicine.  You have a fever.  You are losing weight and you do not know why.  You have night sweats. Get help right away if:  You cough up blood.  You have trouble breathing.  Your heartbeat is very fast. These symptoms may be an emergency. Do not wait to see if the symptoms will go away. Get medical help right away. Call your local emergency services (911 in the [place] Do not drive yourself to the hospital. Summary  A cough helps to clear your throat and lungs. Many things can cause a cough.  Take over-the-counter and prescription medicines only as told by your doctor.  Always cover your mouth when you cough.  Contact a doctor if you have new symptoms or you have a cough that does not get better or gets worse. This information is not intended to replace advice given to you by your health care provider. Make sure you discuss any questions you have with your health care provider. Document Revised: 01/12/2020 Document Reviewed: 12/12/2018 Elsevier Patient Education  2021 [employer].

## 2021-03-20 LAB — CBC WITH DIFFERENTIAL/PLATELET
Basophils Absolute: 0.1 10*3/uL (ref 0.0–0.2)
Basos: 2 %
EOS (ABSOLUTE): 0.4 10*3/uL (ref 0.0–0.4)
Eos: 7 %
Hematocrit: 44.5 % (ref 37.5–51.0)
Hemoglobin: 15 g/dL (ref 13.0–17.7)
Immature Grans (Abs): 0 10*3/uL (ref 0.0–0.1)
Immature Granulocytes: 0 %
Lymphocytes Absolute: 1.8 10*3/uL (ref 0.7–3.1)
Lymphs: 30 %
MCH: 32.3 pg (ref 26.6–33.0)
MCHC: 33.7 g/dL (ref 31.5–35.7)
MCV: 96 fL (ref 79–97)
Monocytes Absolute: 0.6 10*3/uL (ref 0.1–0.9)
Monocytes: 10 %
Neutrophils Absolute: 3.1 10*3/uL (ref 1.4–7.0)
Neutrophils: 51 %
Platelets: 238 10*3/uL (ref 150–450)
RBC: 4.65 x10E6/uL (ref 4.14–5.80)
RDW: 12.3 % (ref 11.6–15.4)
WBC: 6 10*3/uL (ref 3.4–10.8)

## 2021-03-20 LAB — COMPREHENSIVE METABOLIC PANEL
ALT: 37 IU/L (ref 0–44)
AST: 27 IU/L (ref 0–40)
Albumin/Globulin Ratio: 2.3 — ABNORMAL HIGH (ref 1.2–2.2)
Albumin: 4.9 g/dL (ref 3.8–4.9)
Alkaline Phosphatase: 46 IU/L (ref 44–121)
BUN/Creatinine Ratio: 17 (ref 10–24)
BUN: 17 mg/dL (ref 8–27)
Bilirubin Total: 0.5 mg/dL (ref 0.0–1.2)
CO2: 22 mmol/L (ref 20–29)
Calcium: 9.7 mg/dL (ref 8.6–10.2)
Chloride: 104 mmol/L (ref 96–106)
Creatinine, Ser: 0.98 mg/dL (ref 0.76–1.27)
Globulin, Total: 2.1 g/dL (ref 1.5–4.5)
Glucose: 91 mg/dL (ref 65–99)
Potassium: 5.2 mmol/L (ref 3.5–5.2)
Sodium: 143 mmol/L (ref 134–144)
Total Protein: 7 g/dL (ref 6.0–8.5)
eGFR: 88 mL/min/{1.73_m2} (ref >59)

## 2021-03-20 NOTE — Progress Notes (Signed)
Labs within normal

## 2021-03-24 ENCOUNTER — Encounter: Payer: Self-pay | Admitting: Adult Health

## 2021-03-31 ENCOUNTER — Telehealth: Payer: Self-pay

## 2021-03-31 DIAGNOSIS — R059 Cough, unspecified: Secondary | ICD-10-CM

## 2021-03-31 MED ORDER — PREDNISONE 10 MG (21) PO TBPK: ORAL_TABLET | ORAL | 0 refills | Status: DC

## 2021-03-31 MED ORDER — HYDROCOD POLST-CPM POLST ER 10-8 MG/5ML PO SUER: 5.0000 mL | Freq: Every evening | ORAL | 0 refills | Status: DC | PRN

## 2021-03-31 NOTE — Telephone Encounter (Addendum)
Have sent refill for prednisone and the cough syrup

## 2021-03-31 NOTE — Telephone Encounter (Signed)
Copied from CRM [id number]. Topic: General - Other >> Mar 31, 2021  2:18 PM Jaquita Rector A wrote: Reason for CRM: Patient called in to say that he was seen last week and was given some medication but now that he is done taking one he is having excessive coughing and causing him to struggle to breathe asking if one of the doctors can send an Rx for him to the pharmacy and call him at  Ph# [phone]

## 2021-04-01 NOTE — Telephone Encounter (Signed)
Patient advised.

## 2021-05-21 ENCOUNTER — Other Ambulatory Visit: Payer: Self-pay | Admitting: Adult Health

## 2021-06-03 ENCOUNTER — Encounter: Payer: Self-pay | Admitting: Physician Assistant

## 2021-07-04 NOTE — Progress Notes (Signed)
Urological History: 1. Family history of prostate cancer - father and brother diagnosed in their 40's - PSA pending   2. BPH - I PSS 0/0 - ~ 50 cc by DRE  3. Epididymal cysts -scrotal ultrasound 2015 multiple simple appearing cystic structures right epididymis likely benign cyst.

## 2021-07-04 NOTE — Progress Notes (Signed)
07/07/2021 9:26 AM   Benjamin Ford 1961/02/11 [id number]  Referring provider: Trinna Post, PA-C No address on file Chief Complaint  Patient presents with   Follow-up    [facility] follow-up   Urological History 1. Family history of prostate cancer - father and brother diagnosed in their 515's- PSA pending   2. BPH - IPSS 0/0 - ~ 50 cc by DRE  3. Epididymal cysts -scrotal ultrasound 2015 multiple simple appearing cystic structures right epididymis likely benign cyst.  HPI: Benjamin Hannersis a 60y.o.male who presents today with a 6 month follow up for IPSS, PSA, and exam.   Per patients last visit his IPSS was 0/0. His PSA on 01/06/2021 was 0.71.   Patients father and brothers diagnosed with prostate cancer in their 56's   IPSS score today 0/0.   Today the patient was doing well and had no complaints.   Patient denies any modifying or aggravating factors.  Patient denies any gross hematuria, dysuria or suprapubic/flank pain.  Patient denies any fevers, chills, nausea or vomiting.     IPSS Questionnaire (AUA-7): Over the past month.   1)  How often have you had a sensation of not emptying your bladder completely after you finish urinating?  0 - Not at all  2)  How often have you had to urinate again less than two hours after you finished urinating? 0 - Not at all  3)  How often have you found you stopped and started again several times when you urinated?  0 - Not at all  4) How difficult have you found it to postpone urination?  0 - Not at all  5) How often have you had a weak urinary stream?  0 - Not at all  6) How often have you had to push or strain to begin urination?  0 - Not at all  7) How many times did you most typically get up to urinate from the time you went to bed until the time you got up in the morning?  0 - None  Total score:  0-7 mildly symptomatic   8-19 moderately symptomatic   20-35 severely symptomatic      PMH:  Past Medical History:   Diagnosis Date   Anxiety    Asthma    BPH (benign prostatic hyperplasia)    GERD (gastroesophageal reflux disease)    HTN (hypertension)    Insomnia    Palpitations    Stomach ulcer    Testicular mass     Surgical History: Past Surgical History:  Procedure Laterality Date   COLONOSCOPY WITH PROPOFOL N/A 05/27/2020   Procedure: COLONOSCOPY WITH PROPOFOL;  Surgeon: AJonathon Bellows MD;  [facility]  Service: Gastroenterology;  Laterality: N/A;   INGUINAL HERNIA REPAIR  2008   MASTECTOMY Bilateral 1992/1981   VASECTOMY  1983    Home Medications:  Allergies as of 07/07/2021   No Known Allergies      Medication List        Accurate as of July 07, 2021  9:26 AM. If you have any questions, ask your nurse or doctor.          Advair Diskus 100-50 MCG/ACT Aepb Generic drug: fluticasone-salmeterol INHALE 1 PUFF INTO THE LUNGS TWICE A DAY   albuterol 108 (90 Base) MCG/ACT inhaler Commonly known as: VENTOLIN HFA Inhale 2 puffs into the lungs every 6 (six) hours as needed for wheezing or shortness of breath.   amLODipine 10  MG tablet Commonly known as: NORVASC Take 1 tablet (10 mg total) by mouth daily.   calcipotriene 0.005 % cream Commonly known as: DOVONOX Apply topically 2 (two) times daily.   chlorpheniramine-HYDROcodone 10-8 MG/5ML Suer Commonly known as: Tussionex Pennkinetic ER Take 5 mLs by mouth at bedtime as needed for cough (will cause drowsiness. take as needed only.).   fluticasone 50 MCG/ACT nasal spray Commonly known as: FLONASE Place 2 sprays into both nostrils daily.   predniSONE 10 MG (21) Tbpk tablet Commonly known as: STERAPRED UNI-PAK 21 TAB PO: Take 6 tablets on day 1:Take 5 tablets day 2:Take 4 tablets day 3: Take 3 tablets day 4:Take 2 tablets day five: 5 Take 1 tablet day 6   triamcinolone cream 0.1 % Commonly known as: KENALOG Apply 1 application topically 2 (two) times daily.        Allergies: No Known  Allergies  Family History: Family History  Problem Relation Age of Onset   Prostate cancer Father    Cancer Father        bone, prostate   COPD Mother    Cancer Mother        lung   Cancer Maternal Grandmother        brain   Aneurysm Paternal Grandmother    Prostate cancer Brother    Prostate cancer Brother    Kidney disease Neg Hx    Bladder Cancer Neg Hx     Social History:  reports that he has quit smoking. He has never used smokeless tobacco. He reports current alcohol use. He reports that he does not use drugs.   Physical Exam: BP (!) 160/89   Pulse 93   Ht '5\' 6"'  (1.676 m)   Wt 170 lb (77.1 kg)   BMI 27.44 kg/m   Constitutional:  Alert and oriented, No acute distress. HEENT: [place] AT, mask in place.  Trachea midline Cardiovascular: No clubbing, cyanosis, or edema. Respiratory: Normal respiratory effort, no increased work of breathing. GU: No CVA tenderness Rectal: Normal sphincter tone, CC prostate, smooth no nodules. Prostate exam was approximately 50 cc. I could only palpate the apex and mid portion of the gland. Small right hydrocele Lymph: No cervical or inguinal lymphadenopathy. Neurologic: Grossly intact, no focal deficits, moving all 4 extremities. Psychiatric: Normal mood and affect.  Laboratory Data: Previous PSA's:     0.6 ng/mL on 06/26/2014     0.7 ng/mL on 12/27/2014     0.8 ng/mL on 07/20/2016   Results for orders placed or performed in visit on 01/06/2021  PSA  Result Value Ref Range    Prostate Specific Ag, Serum 0.71 0.0 - 4.0 ng/mL   PSA  0.8 ng/mL on 01/25/2017 PSA   0.9 ng/mL on 07/29/2017 PSA  1.0 ng/mL on 01/25/2018 PSA  0.6 ng/mL on 08/03/2018 Component     Latest Ref Rng & Units 06/05/2019 12/07/2019 06/21/2020  Prostatic Specific Antigen     0.00 - 4.00 ng/mL 0.75 0.65 0.63   Comprehensive Metabolites: Component     Latest Ref Rng & Units 03/19/2021  Glucose     65 - 99 mg/dL 91  BUN     8 - 27 mg/dL 17  Creatinine     0.76 -  1.27 mg/dL 0.98  eGFR     >59 mL/min/1.73 88  BUN/Creatinine Ratio     10 - 24 17  Sodium     134 - 144 mmol/L 143  Potassium     3.5 - 5.2  mmol/L 5.2  Chloride     96 - 106 mmol/L 104  CO2     20 - 29 mmol/L 22  Calcium     8.6 - 10.2 mg/dL 9.7  Total Protein     6.0 - 8.5 g/dL 7.0  Albumin     3.8 - 4.9 g/dL 4.9  Globulin, Total     1.5 - 4.5 g/dL 2.1  Albumin/Globulin Ratio     1.2 - 2.2 2.3 (H)  Total Bilirubin     0.0 - 1.2 mg/dL 0.5  Alkaline Phosphatase     44 - 121 IU/L 46  AST     0 - 40 IU/L 27  ALT     0 - 44 IU/L 37   CBC w/differential:   Component     Latest Ref Rng & Units 03/19/2021  WBC     3.4 - 10.8 x10E3/uL 6.0  RBC     4.14 - 5.80 x10E6/uL 4.65  Hemoglobin     13.0 - 17.7 g/dL 15.0  HCT     37.5 - 51.0 % 44.5  MCV     79 - 97 fL 96  MCH     26.6 - 33.0 pg 32.3  MCHC     31.5 - 35.7 g/dL 33.7  RDW     11.6 - 15.4 % 12.3  Platelets     150 - 450 x10E3/uL 238  Neutrophils     Not Estab. % 51  Lymphs     Not Estab. % 30  Monocytes     Not Estab. % 10  Eos     Not Estab. % 7  Basos     Not Estab. % 2  NEUT#     1.4 - 7.0 x10E3/uL 3.1  Lymphocyte #     0.7 - 3.1 x10E3/uL 1.8  Monocytes Absolute     0.1 - 0.9 x10E3/uL 0.6  EOS (ABSOLUTE)     0.0 - 0.4 x10E3/uL 0.4  Basophils Absolute     0.0 - 0.2 x10E3/uL 0.1  Immature Granulocytes     Not Estab. % 0  Immature Grans (Abs)     0.0 - 0.1 x10E3/uL 0.0  I have reviewed the labs.      Assessment & Plan:    BPH  - No complaints at this time  -Continue bi-annual exam and PSA  -IPSS SCORE 0/0  - PSA today will call with results  2. Family history of prostate cancer - father and brother both diagnosed with prostate cancer's in their 61's    [place] as a Education administrator for [employer], PA-C.,have documented all relevant documentation on the behalf of Benjamin Sibal, PA-C,as directed by  [facility], PA-C while in the presence of Otto Caraway,  PA-C.    [facility] [address] [place], [place] [address] [phone]

## 2021-07-07 ENCOUNTER — Ambulatory Visit: Payer: Managed Care, Other (non HMO) | Admitting: Urology

## 2021-07-07 ENCOUNTER — Encounter: Payer: Self-pay | Admitting: Urology

## 2021-07-07 ENCOUNTER — Other Ambulatory Visit: Payer: Self-pay | Admitting: *Deleted

## 2021-07-07 ENCOUNTER — Other Ambulatory Visit: Payer: Self-pay

## 2021-07-07 ENCOUNTER — Other Ambulatory Visit
Admission: RE | Admit: 2021-07-07 | Discharge: 2021-07-07 | Disposition: A | Discharge status: Home / Self Care | Payer: Managed Care, Other (non HMO) | Attending: Urology | Admitting: Urology

## 2021-07-07 VITALS — BP 160/89 | HR 93 | Ht 66.0 in | Wt 170.0 lb

## 2021-07-07 DIAGNOSIS — N138 Other obstructive and reflux uropathy: Secondary | ICD-10-CM | POA: Insufficient documentation

## 2021-07-07 DIAGNOSIS — Z8042 Family history of malignant neoplasm of prostate: Secondary | ICD-10-CM | POA: Diagnosis not present

## 2021-07-07 DIAGNOSIS — N401 Enlarged prostate with lower urinary tract symptoms: Secondary | ICD-10-CM

## 2021-07-07 LAB — PSA: Prostatic Specific Antigen: 0.72 ng/mL (ref 0.00–4.00)

## 2021-07-10 ENCOUNTER — Other Ambulatory Visit: Payer: Self-pay

## 2021-07-10 ENCOUNTER — Ambulatory Visit (INDEPENDENT_AMBULATORY_CARE_PROVIDER_SITE_OTHER): Payer: Managed Care, Other (non HMO) | Admitting: Family Medicine

## 2021-07-10 ENCOUNTER — Encounter: Payer: Self-pay | Admitting: Family Medicine

## 2021-07-10 VITALS — BP 151/87 | HR 73 | Resp 16 | Ht 66.0 in | Wt 170.0 lb

## 2021-07-10 DIAGNOSIS — Z0000 Encounter for general adult medical examination without abnormal findings: Secondary | ICD-10-CM | POA: Diagnosis not present

## 2021-07-10 DIAGNOSIS — N138 Other obstructive and reflux uropathy: Secondary | ICD-10-CM

## 2021-07-10 DIAGNOSIS — N401 Enlarged prostate with lower urinary tract symptoms: Secondary | ICD-10-CM | POA: Diagnosis not present

## 2021-07-10 DIAGNOSIS — I10 Essential (primary) hypertension: Secondary | ICD-10-CM

## 2021-07-10 DIAGNOSIS — L309 Dermatitis, unspecified: Secondary | ICD-10-CM

## 2021-07-10 NOTE — Progress Notes (Signed)
Complete physical exam   Patient: Benjamin Ford   DOB: [birth year]   60 y.o. Male  MRN: [id number] Visit Date: 07/10/2021  Today's healthcare provider: Dortha Kernennis Dagon Budai, PA-C   Chief Complaint  Patient presents with   Annual Exam   Subjective    Benjamin Homerric Lyle Dejager is a 60 y.o. male who presents today for a complete physical exam.  He reports consuming a general diet. The patient does not participate in regular exercise at present. He generally feels well. He reports sleeping well. He does not have additional problems to discuss today.    Past Medical History:  Diagnosis Date   Anxiety    Asthma    BPH (benign prostatic hyperplasia)    GERD (gastroesophageal reflux disease)    HTN (hypertension)    Insomnia    Palpitations    Stomach ulcer    Testicular mass    Past Surgical History:  Procedure Laterality Date   COLONOSCOPY WITH PROPOFOL N/A 05/27/2020   Procedure: COLONOSCOPY WITH PROPOFOL;  Surgeon: Wyline MoodAnna, Kiran, MD;  [facility];  Service: Gastroenterology;  Laterality: N/A;   INGUINAL HERNIA REPAIR  2008   MASTECTOMY Bilateral 1992/1981   VASECTOMY  1983   Social History   Socioeconomic History   Marital status: Married    Spouse name: Not on file   Number of children: Not on file   Years of education: Not on file   Highest education level: Not on file  Occupational History   Not on file  Tobacco Use   Smoking status: Former   Smokeless tobacco: Never   Tobacco comments:    smoked <1  ppd off and on from mid 20s to mid 40s.   Vaping Use   Vaping Use: Never used  Substance and Sexual Activity   Alcohol use: Yes    Alcohol/week: 0.0 standard drinks    Comment: moderate   Drug use: No   Sexual activity: Not on file  Other Topics Concern   Not on file  Social History Narrative   Not on file   Social Determinants of Health   Financial Resource Strain: Not on file  Food Insecurity: Not on file  Transportation Needs: Not on file   Physical Activity: Not on file  Stress: Not on file  Social Connections: Not on file  Intimate Partner Violence: Not on file   Family Status  Relation Name Status   Father  Deceased at age 61       bone and prostate cancer   Mother  Deceased at age 61   MGM  Deceased   PGM  Deceased   Brother  (Not Specified)   Brother  (Not Specified)   Neg Hx  (Not Specified)   Family History  Problem Relation Age of Onset   Prostate cancer Father    Cancer Father        bone, prostate   COPD Mother    Cancer Mother        lung   Cancer Maternal Grandmother        brain   Aneurysm Paternal Grandmother    Prostate cancer Brother    Prostate cancer Brother    Kidney disease Neg Hx    Bladder Cancer Neg Hx    No Known Allergies   Patient Care Team: Jacky KindlePayne, Elise T, FNP as PCP - General (Family Medicine)   Medications: Outpatient Medications Prior to Visit  Medication Sig   ADVAIR DISKUS 100-50 MCG/ACT  AEPB INHALE 1 PUFF INTO THE LUNGS TWICE A DAY   albuterol (VENTOLIN HFA) 108 (90 Base) MCG/ACT inhaler Inhale 2 puffs into the lungs every 6 (six) hours as needed for wheezing or shortness of breath.   amLODipine (NORVASC) 10 MG tablet Take 1 tablet (10 mg total) by mouth daily.   calcipotriene (DOVONOX) 0.005 % cream Apply topically 2 (two) times daily.   chlorpheniramine-HYDROcodone (TUSSIONEX PENNKINETIC ER) 10-8 MG/5ML SUER Take 5 mLs by mouth at bedtime as needed for cough (will cause drowsiness. take as needed only.).   fluticasone (FLONASE) 50 MCG/ACT nasal spray Place 2 sprays into both nostrils daily.   predniSONE (STERAPRED UNI-PAK 21 TAB) 10 MG (21) TBPK tablet PO: Take 6 tablets on day 1:Take 5 tablets day 2:Take 4 tablets day 3: Take 3 tablets day 4:Take 2 tablets day five: 5 Take 1 tablet day 6   triamcinolone cream (KENALOG) 0.1 % Apply 1 application topically 2 (two) times daily.   No facility-administered medications prior to visit.    Review of Systems  All other  systems reviewed and are negative.  Last CBC Lab Results  Component Value Date   WBC 6.0 03/19/2021   HGB 15.0 03/19/2021   HCT 44.5 03/19/2021   MCV 96 03/19/2021   MCH 32.3 03/19/2021   RDW 12.3 03/19/2021   PLT 238 03/19/2021   Last metabolic panel Lab Results  Component Value Date   GLUCOSE 91 03/19/2021   NA 143 03/19/2021   K 5.2 03/19/2021   CL 104 03/19/2021   CO2 22 03/19/2021   BUN 17 03/19/2021   CREATININE 0.98 03/19/2021   GFRNONAA 91 05/28/2020   GFRAA 106 05/28/2020   CALCIUM 9.7 03/19/2021   PROT 7.0 03/19/2021   ALBUMIN 4.9 03/19/2021   LABGLOB 2.1 03/19/2021   AGRATIO 2.3 (H) 03/19/2021   BILITOT 0.5 03/19/2021   ALKPHOS 46 03/19/2021   AST 27 03/19/2021   ALT 37 03/19/2021   Last lipids Lab Results  Component Value Date   CHOL 202 (H) 05/28/2020   HDL 88 05/28/2020   LDLCALC 101 (H) 05/28/2020   TRIG 74 05/28/2020   CHOLHDL 2.3 05/28/2020   Last thyroid functions Lab Results  Component Value Date   TSH 1.620 05/28/2020      Objective    BP (!) 151/87   Pulse 73   Resp 16   Ht 5\' 6"  (1.676 m)   Wt 170 lb (77.1 kg)   BMI 27.44 kg/m  BP Readings from Last 3 Encounters:  07/10/21 (!) 151/87  07/07/21 (!) 160/89  03/19/21 (!) 162/88   Wt Readings from Last 3 Encounters:  07/10/21 170 lb (77.1 kg)  07/07/21 170 lb (77.1 kg)  03/19/21 166 lb (75.3 kg)   Physical Exam Constitutional:      Appearance: He is well-developed.  HENT:     Head: Normocephalic and atraumatic.     Right Ear: External ear normal.     Left Ear: External ear normal.     Nose: Nose normal.  Eyes:     General:        Right eye: No discharge.     Conjunctiva/sclera: Conjunctivae normal.     Pupils: Pupils are equal, round, and reactive to light.  Neck:     Thyroid: No thyromegaly.     Trachea: No tracheal deviation.  Cardiovascular:     Rate and Rhythm: Normal rate and regular rhythm.     Heart sounds: Normal heart sounds. No  murmur heard. Pulmonary:      Effort: Pulmonary effort is normal. No respiratory distress.     Breath sounds: Normal breath sounds. No wheezing or rales.  Chest:     Chest wall: No tenderness.  Abdominal:     General: There is no distension.     Palpations: Abdomen is soft. There is no mass.     Tenderness: There is no abdominal tenderness. There is no guarding or rebound.  Musculoskeletal:        General: No tenderness. Normal range of motion.     Cervical back: Normal range of motion and neck supple.     Comments: Questionable early scarring in the palm of the right hand at the web between the thumb and index finger. Good grip strength bilaterally. Slight decrease in full extension of the left index finger from past fracture.  Lymphadenopathy:     Cervical: No cervical adenopathy.  Skin:    General: Skin is warm and dry.     Findings: No erythema or rash.  Neurological:     Mental Status: He is alert and oriented to person, place, and time.     Cranial Nerves: No cranial nerve deficit.     Motor: No abnormal muscle tone.     Coordination: Coordination normal.     Deep Tendon Reflexes: Reflexes are normal and symmetric. Reflexes normal.  Psychiatric:        Behavior: Behavior normal.        Thought Content: Thought content normal.        Judgment: Judgment normal.      Last depression screening scores PHQ 2/9 Scores 07/10/2021 03/19/2021 11/20/2020  PHQ - 2 Score 0 0 0  PHQ- 9 Score 0 0 0   Last fall risk screening Fall Risk  07/10/2021  Falls in the past year? 0  Number falls in past yr: 0  Injury with Fall? 0  Risk for fall due to : No Fall Risks  Follow up Falls evaluation completed   Last Audit-C alcohol use screening Alcohol Use Disorder Test (AUDIT) 07/10/2021  1. How often do you have a drink containing alcohol? 3  2. How many drinks containing alcohol do you have on a typical day when you are drinking? 0  3. How often do you have six or more drinks on one occasion? 1  AUDIT-C Score 4  4.  How often during the last year have you found that you were not able to stop drinking once you had started? 0  5. How often during the last year have you failed to do what was normally expected from you because of drinking? 0  6. How often during the last year have you needed a first drink in the morning to get yourself going after a heavy drinking session? 0  7. How often during the last year have you had a feeling of guilt of remorse after drinking? 0  8. How often during the last year have you been unable to remember what happened the night before because you had been drinking? 0  9. Have you or someone else been injured as a result of your drinking? 0  10. Has a relative or friend or a doctor or another health worker been concerned about your drinking or suggested you cut down? 0  Alcohol Use Disorder Identification Test Final Score (AUDIT) 4  Alcohol Brief Interventions/Follow-up -   A score of 3 or more in women, and 4 or more in men  indicates increased risk for alcohol abuse, EXCEPT if all of the points are from question 1   No results found for any visits on 07/10/21.  Assessment & Plan    Routine Health Maintenance and Physical Exam  Exercise Activities and Dietary recommendations  Goals   Continue exercise daily and walking dogs.     Immunization History  Administered Date(s) Administered   PFIZER(Purple Top)SARS-COV-2 Vaccination 02/20/2020, 03/12/2020   Tdap 05/28/2020    Health Maintenance  Topic Date Due   Zoster Vaccines- Shingrix (1 of 2) Never done   COVID-19 Vaccine (3 - Booster for Pfizer series) 08/12/2020   INFLUENZA VACCINE  07/07/2021   COLONOSCOPY (Pts 45-49yrs Insurance coverage will need to be confirmed)  05/27/2030   TETANUS/TDAP  05/28/2030   Hepatitis C Screening  Completed   HIV Screening  Completed   Pneumococcal Vaccine 36-60 Years old  Aged Out   HPV VACCINES  Aged Out    Discussed health benefits of physical activity, and encouraged him to  engage in regular exercise appropriate for his age and condition.  1. Annual physical exam Very good general heath exam. Will check with pharmacy regarding pneumonia and shingles vaccinations. Counseled regarding health maintenance. - CBC with Differential/Platelet - Comprehensive metabolic panel - Lipid panel - TSH  2. Primary hypertension Well controlled with exercise and Amlodipine 10 mg qd. Recheck labs and follow up pending reports. - CBC with Differential/Platelet - Comprehensive metabolic panel - Lipid panel - TSH  3. BPH with obstruction/lower urinary tract symptoms Followed by Michiel Cowboy, PA-C ([facility]) on 07-07-21 with PSA 0.72. Recheck CMP. - Comprehensive metabolic panel  4. Dermatitis Well controlled with Triamcinolone and Dovonox creams when it flares.    No follow-ups on file.     I, Hadiya Spoerl, PA-C, have reviewed all documentation for this visit. The documentation on 07/10/21 for the exam, diagnosis, procedures, and orders are all accurate and complete.    Dortha Kern, PA-C  [facility] [phone] (phone) [phone] (fax)  [facility]

## 2021-07-18 ENCOUNTER — Other Ambulatory Visit: Payer: Self-pay | Admitting: Adult Health

## 2021-07-18 DIAGNOSIS — I10 Essential (primary) hypertension: Secondary | ICD-10-CM

## 2021-10-01 ENCOUNTER — Other Ambulatory Visit: Payer: Self-pay | Admitting: Family Medicine

## 2021-10-01 ENCOUNTER — Ambulatory Visit: Payer: Self-pay

## 2021-10-01 DIAGNOSIS — R051 Acute cough: Secondary | ICD-10-CM

## 2021-10-01 DIAGNOSIS — R059 Cough, unspecified: Secondary | ICD-10-CM

## 2021-10-01 MED ORDER — BENZONATATE 100 MG PO CAPS: 100.0000 mg | ORAL_CAPSULE | Freq: Two times a day (BID) | ORAL | 0 refills | Status: DC | PRN

## 2021-10-01 MED ORDER — ALBUTEROL SULFATE HFA 108 (90 BASE) MCG/ACT IN AERS: 2.0000 | INHALATION_SPRAY | Freq: Four times a day (QID) | RESPIRATORY_TRACT | 0 refills | Status: DC | PRN

## 2021-10-01 NOTE — Telephone Encounter (Signed)
LOV 07/10/2021

## 2021-10-01 NOTE — Telephone Encounter (Signed)
Copied from CRM [id number]. Topic: Quick Communication - Rx Refill/Question >> Oct 01, 2021  9:42 AM [name] wrote: Medication: albuterol (VENTOLIN HFA) 108 (90 Base) MCG/ACT inhaler and benzonatate (TESSALON PERLES) 100 MG capsule   Has the patient contacted their pharmacy? No. Pt requests Rx to be sent to a new pharmacy (Agent: If no, request that the patient contact the pharmacy for the refill. If patient does not wish to contact the pharmacy document the reason why and proceed with request.) (Agent: If yes, when and what did the pharmacy advise?)  Preferred Pharmacy (with phone number or street name): [id number] [place] Rough, [place] - [address] AT [facility] [address] Phone: [phone]   Fax: [phone]  Has the patient been seen for an appointment in the last year OR does the patient have an upcoming appointment? Yes.    Agent: Please be advised that RX refills may take up to 3 business days. We ask that you follow-up with your pharmacy.

## 2021-10-01 NOTE — Telephone Encounter (Signed)
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     Pulmonology:  Beta [employer]onists Failed - 10/01/2021 10:37 AM      Failed - One inhaler should last at least one month. If the patient is requesting refills earlier, contact the patient to check for uncontrolled symptoms.      Passed - Valid encounter within last 12 months    Recent Outpatient Visits           2 months ago Annual physical exam   [facility] Chrismon, Jodell Cipro, PA-C   6 months ago Seasonal allergies   [employer], Eula Fried, FNP   7 months ago Cough   [facility] Osvaldo Angst M, PA-C   8 months ago History of COVID-19   [facility] Osvaldo Angst M, PA-C   8 months ago Primary hypertension   [employer], Lavella Hammock, PA-C       Future Appointments             In 3 months McGowan, Wellington Hampshire, PA-C [facility] [place]             benzonatate (TESSALON PERLES) 100 MG capsule 21 capsule 0    Sig: Take 1 capsule (100 mg total) by mouth 3 (three) times daily as needed for up to 7 days for cough.     Ear, Nose, and Throat:  Antitussives/Expectorants Passed - 10/01/2021 10:37 AM      Passed - Valid encounter within last 12 months    Recent Outpatient Visits           2 months ago Annual physical exam   [facility] Chrismon, Jodell Cipro, PA-C   6  months ago Seasonal allergies   [employer], Eula Fried, FNP   7 months ago Cough   [facility] Osvaldo Angst M, [place]   8 months ago History of COVID-19   [facility] [place], Ali Molina, [place]   8 months ago Primary hypertension   [employer], Lavella Hammock, [place]       Future Appointments             In 3 months McGowan, Elana Alm [facility] [place]

## 2021-10-01 NOTE — Telephone Encounter (Signed)
See Nurse Triage encounter, patient triaged for cough. Patient advised these refills will be sent to the provider but advised he will need a visit, declined visit.

## 2021-10-01 NOTE — Telephone Encounter (Signed)
Patient called due to his request for Albuterol inhaler and Tessalon Pearles. I advised the inhaler is on the profile, but tessalon pearles is not listed on the current medication list. He says he's been coughing for the past 2 weeks, a productive cough of green to clear sputum. He says in the morning it's green and as the day goes on it's clear. He says he's been using his inhaler everyday for the past 2 weeks due to mild SOB when up moving and exerting himself and the inhaler relieves the SOB. He denies fever, no other symptoms. I advised he will need an evaluation by the provider. He says he doesn't have time for an appointment. He says he will just keep using the inhaler. Care advice given, advised I will send this to the provider as well as the refill requests, advised e-visit or virtual UC visit if symptoms worsen, patient verbalized understanding.   Reason for Disposition  Cough has been present for > 3 weeks  Answer Assessment - Initial Assessment Questions 1. ONSET: "When did the cough begin?"      2 weeks ago 2. SEVERITY: "How bad is the cough today?"      Mild during the day, hits harder laying in the bed 3. SPUTUM: "Describe the color of your sputum" (none, dry cough; clear, white, yellow, green)     Clear, thin and green, thick (morning green) 4. HEMOPTYSIS: "Are you coughing up any blood?" If so ask: "How much?" (flecks, streaks, tablespoons, etc.)     No 5. DIFFICULTY BREATHING: "Are you having difficulty breathing?" If Yes, ask: "How bad is it?" (e.g., mild, moderate, severe)    - MILD: No SOB at rest, mild SOB with walking, speaks normally in sentences, can lie down, no retractions, pulse < 100.    - MODERATE: SOB at rest, SOB with minimal exertion and prefers to sit, cannot lie down flat, speaks in phrases, mild retractions, audible wheezing, pulse 100-120.    - SEVERE: Very SOB at rest, speaks in single words, struggling to breathe, sitting hunched forward, retractions, pulse >  120      Mild  6. FEVER: "Do you have a fever?" If Yes, ask: "What is your temperature, how was it measured, and when did it start?"     No 7. CARDIAC HISTORY: "Do you have any history of heart disease?" (e.g., heart attack, congestive heart failure)      No 8. LUNG HISTORY: "Do you have any history of lung disease?"  (e.g., pulmonary embolus, asthma, emphysema)     No 9. PE RISK FACTORS: "Do you have a history of blood clots?" (or: recent major surgery, recent prolonged travel, bedridden)     No 10. OTHER SYMPTOMS: "Do you have any other symptoms?" (e.g., runny nose, wheezing, chest pain)      Chest congestion 11. PREGNANCY: "Is there any chance you are pregnant?" "When was your last menstrual period?"       N/A 12. TRAVEL: "Have you traveled out of the country in the last month?" (e.g., travel history, exposures)       No  Protocols used: Cough - Acute Productive-A-AH

## 2021-10-20 ENCOUNTER — Telehealth: Payer: Self-pay

## 2021-10-20 NOTE — Telephone Encounter (Signed)
Copied from CRM [id number]. Topic: Referral - Request for Referral >> Oct 20, 2021 11:08 AM Pawlus, Maxine Glenn A wrote: Has patient seen PCP for this complaint? Yes.   Referral for which specialty: pulmonologist Reason for referral: Pt requested this referral due to having breathing issues/ concerns. Pt was seen on 10/26 for a cough which doesn't seem to be much better.

## 2021-10-21 ENCOUNTER — Other Ambulatory Visit: Payer: Self-pay | Admitting: Family Medicine

## 2021-10-21 DIAGNOSIS — R051 Acute cough: Secondary | ICD-10-CM

## 2021-10-21 DIAGNOSIS — R053 Chronic cough: Secondary | ICD-10-CM

## 2021-11-10 ENCOUNTER — Ambulatory Visit
Admission: RE | Admit: 2021-11-10 | Discharge: 2021-11-10 | Disposition: A | Discharge status: Home / Self Care | Payer: 59 | Source: Ambulatory Visit | Attending: Internal Medicine | Admitting: Internal Medicine

## 2021-11-10 ENCOUNTER — Other Ambulatory Visit: Payer: Self-pay

## 2021-11-10 ENCOUNTER — Ambulatory Visit (INDEPENDENT_AMBULATORY_CARE_PROVIDER_SITE_OTHER): Payer: 59 | Admitting: Internal Medicine

## 2021-11-10 ENCOUNTER — Encounter: Payer: Self-pay | Admitting: Internal Medicine

## 2021-11-10 DIAGNOSIS — R058 Other specified cough: Secondary | ICD-10-CM | POA: Diagnosis present

## 2021-11-10 MED ORDER — GABAPENTIN 100 MG PO CAPS: ORAL_CAPSULE | ORAL | 2 refills | Status: DC

## 2021-11-10 MED ORDER — FAMOTIDINE 20 MG PO TABS: ORAL_TABLET | ORAL | 11 refills | Status: DC

## 2021-11-10 MED ORDER — PANTOPRAZOLE SODIUM 40 MG PO TBEC: 40.0000 mg | DELAYED_RELEASE_TABLET | Freq: Every day | ORAL | 2 refills | Status: DC

## 2021-11-10 MED ORDER — PREDNISONE 10 MG PO TABS: ORAL_TABLET | ORAL | 0 refills | Status: DC

## 2021-11-10 NOTE — Patient Instructions (Addendum)
The key to effective treatment for your cough is eliminating the non-stop cycle of cough you're stuck in long enough to let your airway heal completely and then see if there is anything still making you cough once you stop the cough suppression, but this should take no more than 5 days to figure out  For cough take delsym two tsp every 12 hours as needed to supplement the gabapentin at least for the first week or so and avoid any situation that you know will make your cough   Start gabapentin 100 mg twice daily and add a pill every few days to a maximum of 300mg  4 x daily or the lowest dose that suppresses even the urge to cough   Prednisone 10 mg take  4 each am x 2 days,   2 each am x 2 days,  1 each am x 2 days and stop (this is to eliminate allergies and inflammation from coughing)  Protonix (pantoprazole) Take 30-60 min before first meal of the day and Pepcid 20 mg one bedtime plus chlorpheniramine 4 mg x 2 at bedtime ( available over the counter)  until return - can use chlorpheniramine as well one every 4 hours if doesn't make you too sleepy  GERD (REFLUX)  is an extremely common cause of respiratory symptoms, many times with no significant heartburn at all.    It can be treated with medication, but also with lifestyle changes including avoidance of late meals, excessive alcohol, smoking cessation, and avoid fatty foods, chocolate, peppermint, colas, red wine, and acidic juices such as orange juice.  NO MINT OR MENTHOL PRODUCTS SO NO COUGH DROPS  USE HARD CANDY INSTEAD (jolley ranchers or Stover's or Lifesavers (all available in sugarless versions) NO OIL BASED VITAMINS - use powdered substitutes.  Stop Advair and Only use your albuterol as a rescue medication to be used if you can't catch your breath by resting or doing a relaxed purse lip breathing pattern.  - The less you use it, the better it will work when you need it. - Ok to use up to 2 puffs  every 4 hours if you must but call for  immediate appointment if use goes up over your usual need - Don't leave home without it !!  (think of it like the spare tire for your car)   Please remember to go to the  x-ray department  for your tests - we will call you with the results when they are available    Please schedule a follow up office visit in 4 weeks, sooner if needed

## 2021-11-10 NOTE — Progress Notes (Signed)
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  referred to pulmonary clinic in [facility]  11/10/2021 by NP Merita Norton for chronic/recurrent  cough onset 2013 with difficulty shaking off colds with freq bronchitis / pna  then Covid xmas 2021 and coughing daily since> rx prednisone / alb/advair dpi no better   History of Present Illness  11/10/2021  Pulmonary/ 1st office eval/ [name] / [facility]  Chief Complaint  Patient presents with   Consult    Cough-  Dyspnea:  walking at normal pace has been difficult since Oct 2022 when caught "bad cold" clear mucus Cough: 2-3 x noct cough/ all daytime / worse with voice / fumes/ ok eating / can't sing anymore s coughing  Sleep: flat with one pillow  SABA use: seems to help p 1st  initiating the cough  Last pred 5 days prior to ov did not help Does not feel zyrtec helped  No obvious day to day or daytime variability or assoc excess/ purulent sputum or mucus plugs or hemoptysis or cp or chest tightness, subjective wheeze or overt sinus or hb symptoms.    Also denies any obvious fluctuation of symptoms with weather or environmental changes or other aggravating or alleviating factors except as outlined above   No unusual exposure hx or h/o childhood pna/ asthma or knowledge of premature birth.  Current Allergies, Complete Past Medical History, Past Surgical History, Family History, and Social History were reviewed in [employer] record.  ROS  The following are not active complaints unless bolded Hoarseness, sore throat, dysphagia, dental problems, itching, sneezing,  nasal congestion or discharge of excess mucus or purulent secretions, ear ache,   fever, chills, sweats, unintended wt loss or wt gain, classically pleuritic or exertional cp,  orthopnea pnd or arm/hand swelling  or leg swelling, presyncope, palpitations, abdominal  pain, anorexia, nausea, vomiting, diarrhea  or change in bowel habits or change in bladder habits, change in stools or change in urine, dysuria, hematuria,  rash, arthralgias, visual complaints, headache, numbness, weakness or ataxia or problems with walking or coordination,  change in mood or  memory.             Past Medical History:  Diagnosis Date   Anxiety    Asthma    BPH (benign prostatic hyperplasia)    GERD (gastroesophageal reflux disease)    HTN (hypertension)    Insomnia    Palpitations    Stomach ulcer    Testicular mass     Outpatient Medications Prior to Visit  Medication Sig Dispense Refill   ADVAIR DISKUS 100-50 MCG/ACT AEPB INHALE 1 PUFF INTO THE LUNGS TWICE A DAY 60 each 1   albuterol (VENTOLIN HFA) 108 (90 Base) MCG/ACT inhaler Inhale 2 puffs into the lungs every 6 (six) hours as needed for wheezing or shortness of breath. 8 g 0   chlorpheniramine-HYDROcodone (TUSSIONEX PENNKINETIC ER) 10-8 MG/5ML SUER Take 5 mLs by mouth at bedtime as needed for cough (will cause drowsiness. take as needed only.). 115 mL 0   fluticasone (FLONASE) 50 MCG/ACT nasal spray Place 2 sprays into both nostrils daily. 16 g 6   triamcinolone cream (KENALOG) 0.1 % Apply 1 application topically 2 (two) times daily. 30 g 0   amLODipine (NORVASC) 10 MG tablet TAKE 1 TABLET BY MOUTH EVERY DAY 90 tablet 1  benzonatate (TESSALON) 100 MG capsule Take 1 capsule (100 mg total) by mouth 2 (two) times daily as needed for cough. 20 capsule 0   calcipotriene (DOVONOX) 0.005 % cream Apply topically 2 (two) times daily.         0       Objective:     BP (!) 146/100 (BP Location: Left Arm, Patient Position: Sitting, Cuff Size: Normal)   Pulse 69   Temp (!) 97.1 F (36.2 C) (Oral)   Ht 5\' 6"  (1.676 m)   Wt 172 lb 3.2 oz (78.1 kg)   SpO2 98%   BMI 27.79 kg/m   SpO2: 98 % RA   - note pt says bp always higher at doctor's office, much better at home on self monitoring   Amb pleasant wm with  freq throat clearing and honking dry sounding upper airway cough   HEENT : pt wearing mask not removed for exam due to covid -19 concerns.    NECK :  without JVD/Nodes/TM/ nl carotid upstrokes bilaterally   LUNGS: no acc muscle use,  Nl contour chest which is clear to A and P bilaterally with cough on insp  maneuvers   CV:  RRR  no s3 or murmur or increase in P2, and no edema   ABD:  soft and nontender with nl inspiratory excursion in the supine position. No bruits or organomegaly appreciated, bowel sounds nl  MS:  Nl gait/ ext warm without deformities, calf tenderness, cyanosis or clubbing No obvious joint restrictions   SKIN: warm and dry without lesions    NEURO:  alert, approp, nl sensorium with  no motor or cerebellar deficits apparent.    CXR PA and Lateral:   11/10/2021 :    I personally reviewed images and impression is as follows:     Slt reduced lung volumes, no acute changes    Assessment   Upper airway cough syndrome Onset was 2013 but worse since covid xmas 2021 and ur Oct [id number]  -  Cyclical cough rx with gabapentin/ gerd rx 11/10/2021 >>>   - The proper method of use, as well as anticipated side effects, of a metered-dose inhaler were discussed and demonstrated to the patient using teach back method.     Most likely this is Upper airway cough syndrome (previously labeled PNDS),  is so named because it's frequently impossible to sort out how much is  CR/sinusitis with freq throat clearing (which can be related to primary GERD)   vs  causing  secondary (" extra esophageal")  GERD from wide swings in gastric pressure that occur with throat clearing, often  promoting self use of mint and menthol lozenges that reduce the lower esophageal sphincter tone and exacerbate the problem further in a cyclical fashion.   These are the same pts (now being labeled as having "irritable larynx syndrome" by some cough centers) who not infrequently have a history of having failed to  tolerate ace inhibitors,  dry powder inhalers(esp advair)  or biphosphonates or report having atypical/extraesophageal reflux symptoms that don't respond to standard doses of PPI  and are easily confused as having aecopd or asthma flares by even experienced allergists/ pulmonologists (myself included).   Of particular importance here,  the three most common causes of  Sub-acute / recurrent or chronic cough, only one (GERD)  can actually contribute to/ trigger  the other two (asthma and post nasal drip syndrome)  and perpetuate the cylce of cough.  While not intuitively obvious, many  patients with chronic low grade reflux do not cough until there is a primary insult that disturbs the protective epithelial barrier and exposes sensitive nerve endings.   This is typically viral but can due to PNDS and  either may apply here.   The point is that once this occurs, it is difficult to eliminate the cycle  using anything but a maximally effective acid suppression regimen at least in the short run, accompanied by an appropriate diet to address non acid GERD and control / eliminate the cough itself with gabapentin in the lowest effective dose, eliminate all pnds with 1st gen H1 blockers per guidelines  And >>> also so added 6 day taper off  Prednisone starting at 40 mg per day in case of component of Th-2 driven upper or lower airways inflammation (if cough responds short term only to relapse before return while will on full rx for uacs (as above), then  that would point to allergic rhinitis/ asthma or eos bronchitis as alternative dx).  see avs for instructions unique to this ov - will see in 4-6 weeks, call sooner if not improving on rx               Each maintenance medication was reviewed in detail including emphasizing most importantly the difference between maintenance and prns and under what circumstances the prns are to be triggered using an action plan format where appropriate.  Total time for H and P,  chart review, counseling, reviewing hfa device(s) and generating customized AVS unique to this initial  office visit / same day charting = 48 min                  Christinia Gully, MD 11/10/2021

## 2021-11-10 NOTE — Assessment & Plan Note (Addendum)
Onset was 2013 but worse since covid xmas 2021 and ur Oct 2022  -  Cyclical cough rx with gabapentin/ gerd rx 11/10/2021 >>>   - The proper method of use, as well as anticipated side effects, of a metered-dose inhaler were discussed and demonstrated to the patient using teach back method.     Most likely this is Upper airway cough syndrome (previously labeled PNDS),  is so named because it's frequently impossible to sort out how much is  CR/sinusitis with freq throat clearing (which can be related to primary GERD)   vs  causing  secondary (" extra esophageal")  GERD from wide swings in gastric pressure that occur with throat clearing, often  promoting self use of mint and menthol lozenges that reduce the lower esophageal sphincter tone and exacerbate the problem further in a cyclical fashion.   These are the same pts (now being labeled as having "irritable larynx syndrome" by some cough centers) who not infrequently have a history of having failed to tolerate ace inhibitors,  dry powder inhalers(esp advair)  or biphosphonates or report having atypical/extraesophageal reflux symptoms that don't respond to standard doses of PPI  and are easily confused as having aecopd or asthma flares by even experienced allergists/ pulmonologists (myself included).   Of particular importance here,  the three most common causes of  Sub-acute / recurrent or chronic cough, only one (GERD)  can actually contribute to/ trigger  the other two (asthma and post nasal drip syndrome)  and perpetuate the cylce of cough.  While not intuitively obvious, many patients with chronic low grade reflux do not cough until there is a primary insult that disturbs the protective epithelial barrier and exposes sensitive nerve endings.   This is typically viral but can due to PNDS and  either may apply here.   The point is that once this occurs, it is difficult to eliminate the cycle  using anything but a maximally effective acid suppression  regimen at least in the short run, accompanied by an appropriate diet to address non acid GERD and control / eliminate the cough itself with gabapentin in the lowest effective dose, eliminate all pnds with 1st gen H1 blockers per guidelines  And >>> also so added 6 day taper off  Prednisone starting at 40 mg per day in case of component of Th-2 driven upper or lower airways inflammation (if cough responds short term only to relapse before return while will on full rx for uacs (as above), then  that would point to allergic rhinitis/ asthma or eos bronchitis as alternative dx).  see avs for instructions unique to this ov - will see in 4-6 weeks, call sooner if not improving on rx               Each maintenance medication was reviewed in detail including emphasizing most importantly the difference between maintenance and prns and under what circumstances the prns are to be triggered using an action plan format where appropriate.  Total time for H and P, chart review, counseling, reviewing hfa device(s) and generating customized AVS unique to this initial  office visit / same day charting = 48 min

## 2021-11-19 ENCOUNTER — Telehealth: Payer: Self-pay | Admitting: Internal Medicine

## 2021-11-19 NOTE — Telephone Encounter (Signed)
Dr [name] Please Advise:   Pt states MW put pt on Gabapentin cough reflex and pt unable to take it at work, Due to causing issues. 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 Pt wanting to know if MW can recommend something "less severe"

## 2021-11-19 NOTE — Telephone Encounter (Signed)
Instructions were: Start gabapentin 100 mg twice daily and add a pill every few days to a maximum of 300mg  4 x daily or the lowest dose that suppresses even the urge to cough   If gets drowsy at work don't take any dose strong enough to cause drowsiness - if can't take even 100 mg before work without the problem, take more after work with the goal of building up the total 24 hour amt to whatever dose controls the cough but does not cause him to be overly drowsy but not to exceed 1200 mg /24h  The only cough meds that don'ts cause any drowsiness at all are tessalon which he already tried and delsym which is fine to take daytime at work up to 2 tsp every 12 hours prn

## 2021-11-19 NOTE — Telephone Encounter (Signed)
I called and spoke with the Benjamin Ford and notified him of Dr Thurston Hole response. He verbalized understanding. Nothing further needed.

## 2021-11-28 ENCOUNTER — Other Ambulatory Visit: Payer: Self-pay | Admitting: Family Medicine

## 2021-11-28 DIAGNOSIS — R051 Acute cough: Secondary | ICD-10-CM

## 2021-11-28 MED ORDER — ALBUTEROL SULFATE HFA 108 (90 BASE) MCG/ACT IN AERS: 2.0000 | INHALATION_SPRAY | Freq: Four times a day (QID) | RESPIRATORY_TRACT | 2 refills | Status: DC | PRN

## 2021-11-28 NOTE — Telephone Encounter (Signed)
Requested Prescriptions  Pending Prescriptions Disp Refills   albuterol (VENTOLIN HFA) 108 (90 Base) MCG/ACT inhaler 8 g 2    Sig: Inhale 2 puffs into the lungs every 6 (six) hours as needed for wheezing or shortness of breath.     Pulmonology:  [facility] Failed - 11/28/2021 11:07 AM      Failed - One inhaler should last at least one month. If the patient is requesting refills earlier, contact the patient to check for uncontrolled symptoms.      Passed - Valid encounter within last 12 months    Recent Outpatient Visits          4 months ago Annual physical exam   [facility] Chrismon, Jodell Cipro, PA-C   8 months ago Seasonal allergies   [employer], Eula Fried, FNP   9 months ago Cough   [facility] Osvaldo Angst M, [place]   10 months ago History of COVID-19   [facility] Osvaldo Angst M, [place]   10 months ago Primary hypertension   [employer], Lavella Hammock, PA-C      Future Appointments            In 1 month Wert, Charlaine Dalton, MD [facility] [place]   In 1 month McGowan, Elana Alm [facility] [place]

## 2021-11-28 NOTE — Telephone Encounter (Signed)
Medication Refill - Medication: albuterol (VENTOLIN HFA) 108 (90 Base) MCG/ACT inhaler  Has the patient contacted their pharmacy? Yes.   Wife says they called it in 2 days ago, but it was last filled at a different pharmacy. (tPreferred Pharmacy (with phone number or street name): [id number] - [place], [place] - [address]  Has the patient been seen for an appointment in the last year OR does the patient have an upcoming appointment? Yes.    Wife states pt really needs this inhaler asap please.

## 2021-12-02 ENCOUNTER — Telehealth: Payer: Self-pay | Admitting: Internal Medicine

## 2021-12-02 NOTE — Telephone Encounter (Signed)
Called and spoke with patient who states that he is still not feeling any better. States that he is still coughing a lot and that its keeping him up at night and having chest tightness. Productive cough with green/ brown sputum. Denies fever or any shortness of breath. He has completed meds given by MW. Wants to know if there is anything else he can take.  Pharmacy: [facility] on [address]   Dr. [name] please advise

## 2021-12-02 NOTE — Telephone Encounter (Signed)
Make sure he understood, did the following:  Start gabapentin 100 mg twice daily and add a pill every few days to a maximum of 300mg  4 x daily or the lowest dose that suppresses even the urge to cough   If on max dose of gabapentin as aboe = 1200 mg daily and not improved over baseline then ov asap with all meds in hand to regroup - nothing else to rec over the phone

## 2021-12-02 NOTE — Telephone Encounter (Signed)
Spoke with the pt He states has got up to 400 mg gabapentin  Can not take consistently when he works  Cough no better  Video visit with MW tomorrow  He lives in [place] and unable to drive to [employer]

## 2021-12-03 ENCOUNTER — Encounter: Payer: Self-pay | Admitting: Internal Medicine

## 2021-12-03 ENCOUNTER — Telehealth (INDEPENDENT_AMBULATORY_CARE_PROVIDER_SITE_OTHER): Payer: 59 | Admitting: Internal Medicine

## 2021-12-03 ENCOUNTER — Other Ambulatory Visit: Payer: Self-pay

## 2021-12-03 DIAGNOSIS — R058 Other specified cough: Secondary | ICD-10-CM

## 2021-12-03 MED ORDER — BUDESONIDE-FORMOTEROL FUMARATE 80-4.5 MCG/ACT IN AERO: INHALATION_SPRAY | RESPIRATORY_TRACT | 12 refills | Status: DC

## 2021-12-03 MED ORDER — PREDNISONE 10 MG PO TABS: ORAL_TABLET | ORAL | 0 refills | Status: DC

## 2021-12-03 MED ORDER — AMOXICILLIN-POT CLAVULANATE 875-125 MG PO TABS: 1.0000 | ORAL_TABLET | Freq: Two times a day (BID) | ORAL | 1 refills | Status: AC

## 2021-12-03 NOTE — Progress Notes (Addendum)
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  referred to pulmonary clinic in [facility]  11/10/2021 by NP Tally Joe for chronic/recurrent  cough onset 2013 with difficulty shaking off colds with freq bronchitis / pna  then Covid xmas 2021 and coughing daily since> rx prednisone / alb/advair dpi no better   History of Present Illness  11/10/2021  Pulmonary/ 1st office eval/ Melvyn Novas / [facility]  Chief Complaint  Patient presents with   Consult    Cough-  Dyspnea:  walking at normal pace has been difficult since Oct 2022 when caught "bad cold" clear mucus Cough: 2-3 x noct cough/ all daytime / worse with voice / fumes/ ok eating / can't sing anymore s coughing   Sleep: flat with one pillow  SABA use: seems to help p 1st  initiating the cough  Last pred 5 days prior to ov did not help Does not feel zyrtec helped Rec For cough take delsym two tsp every 12 hours as needed to supplement the gabapentin at least for the first week or so and avoid any situation that you know will make your cough  Start gabapentin 100 mg twice daily and add a pill every few days to a maximum of 300mg  4 x daily or the lowest dose that suppresses even the urge to cough  Prednisone 10 mg take  4 each am x 2 days,   2 each am x 2 days,  1 each am x 2 days and stop (this is to eliminate allergies and inflammation from coughing) Protonix (pantoprazole) Take 30-60 min before first meal of the day and Pepcid 20 mg one bedtime plus chlorpheniramine 4 mg x 2 at bedtime ( available over the counter)  until return - can use chlorpheniramine as well one every 4 hours if doesn't make you too sleepy GERD diet reviewed, bed blocks rec  Stop Advair and Only use your albuterol as a rescue medication       12/03/2021  Virtual  ov/[place] office/Aanshi Batchelder Virtual Visit via  computer 12/03/2021   I connected with [name]  on 12/03/21 at 230 pm by video and verified that I am speaking with the correct person using two identifiers. Pt is at home and this call made from my office with no other participants    I discussed the limitations, risks, security and privacy concerns of performing an evaluation and management service by video  and the availability of in person appointments. I also discussed with the patient that there may be a patient responsible charge related to this service. The patient expressed understanding and agreed to proceed.    I provided 12 minutes of virtual face-to-face time during this encounter.   re: cough x 12/ maint on gabapentin 1 gm / 24 hours plus delsym    Chief Complaint  Patient presents with   Acute Visit    Cough started and trouble breathing oct. 2022. Has increased in severity in November and has worsened since. States he is having a hard time sleeping at night. Urgent care on 11/22 and dx with pneumonia. Feels his cough has not improved since.    Dyspnea:  better  Cough: worse 2 pm and then around 4 am variably green always thick  Sleeping: recliner 45 degrees x sev weeks since  mid October 2022  SABA use: 60 puffs  per week/ seems to help up to 2 hours / some better with prednisone  02: none  Covid status: vax x 2      No obvious day to day or daytime variability or assoc excess/ purulent sputum or mucus plugs or hemoptysis or cp or chest tightness, subjective wheeze or overt sinus or hb symptoms.    Also denies any obvious fluctuation of symptoms with weather or environmental changes or other aggravating or alleviating factors except as outlined above   No unusual exposure hx or h/o childhood pna/ asthma or knowledge of premature birth.  Current Allergies, Complete Past Medical History, Past Surgical History, Family History, and Social History were reviewed in [employer] record.  ROS  The following are not active complaints unless  bolded Hoarseness, sore throat, dysphagia, dental problems, itching, sneezing,  nasal congestion or discharge of excess mucus or purulent secretions, ear ache,   fever, chills, sweats, unintended wt loss or wt gain, classically pleuritic or exertional cp,  orthopnea pnd or arm/hand swelling  or leg swelling, presyncope, palpitations, abdominal pain, anorexia, nausea, vomiting, diarrhea  or change in bowel habits or change in bladder habits, change in stools or change in urine, dysuria, hematuria,  rash, arthralgias, visual complaints, headache, numbness, weakness or ataxia or problems with walking or coordination,  change in mood or  memory.        Current Meds  Medication Sig   albuterol (VENTOLIN HFA) 108 (90 Base) MCG/ACT inhaler Inhale 2 puffs into the lungs every 6 (six) hours as needed for wheezing or shortness of breath.   famotidine (PEPCID) 20 MG tablet One after supper   fluticasone (FLONASE) 50 MCG/ACT nasal spray Place 2 sprays into both nostrils daily.   gabapentin (NEURONTIN) 100 MG capsule One four times daily   pantoprazole (PROTONIX) 40 MG tablet Take 1 tablet (40 mg total) by mouth daily. Take 30-60 min before first meal of the day   predniSONE (DELTASONE) 10 MG tablet Take  4 each am x 2 days,   2 each am x 2 days,  1 each am x 2 days and stop   triamcinolone cream (KENALOG) 0.1 % Apply 1 application topically 2 (two) times daily.            Past Medical History:  Diagnosis Date   Anxiety    Asthma    BPH (benign prostatic hyperplasia)    GERD (gastroesophageal reflux disease)    HTN (hypertension)    Insomnia    Palpitations    Stomach ulcer    Testicular mass        Objective:    Wt Readings from Last 3 Encounters:  12/03/21 161 lb (73 kg)  11/10/21 172 lb 3.2 oz (78.1 kg)  07/10/21 170 lb (77.1 kg)       Virtual / occ throat clearing dry sounding       Assessment

## 2021-12-03 NOTE — Patient Instructions (Addendum)
Continue the gabapentin at highest tolerated or 1200 mg whichever comes first   Continue protonix 40 mg Take 30-60 min before first meal of the day and pepcpd 20 mg after supper   For cough > mucinex dm 1200 mg every 12 hours as needed   Augmentin 875 mg take one pill twice daily  X 10 -20 days - take at breakfast and supper with large glass of water.  It would help reduce the usual side effects (diarrhea and yeast infections) if you ate cultured yogurt at lunch.   Prednisone 10 mg take  4 each am x 2 days,   2 each am x 2 days,  1 each am x 2 days and stop   Stop chlorpheniramine and deslym   Plan A = Automatic = Always=    Symbicort 80 Take 2 puffs first thing in am and then another 2 puffs about 12 hours later.    Plan B = Backup (to supplement plan A, not to replace it) Only use your albuterol inhaler as a rescue medication to be used if you can't catch your breath by resting or doing a relaxed purse lip breathing pattern.  - The less you use it, the better it will work when you need it. - Ok to use the inhaler up to 2 puffs  every 4 hours if you must but call for appointment if use goes up over your usual need - Don't leave home without it !!  (think of it like the spare tire for your car)   Call if not improved after augmentin call me for sinus ct - if improved but not completely better take another 10 days   Keep your previous appt with me and bring your medications including over the counters

## 2021-12-03 NOTE — Assessment & Plan Note (Signed)
Onset was 2013 but persistent/daily  since covid xmas 2021 and ur Oct 2022  -  Cyclical cough rx with gabapentin/ gerd rx 11/10/2021 >>> mucus turned green  -  12/03/2021 rx augmentin x 10-20 days/ pred x 6 days, start symbicort 80 2bid as using up to 60 puffs a week of albuterol  The standardized cough guidelines published in Chest by [place] in 2006 are still the best available and consist of a multiple step process (up to 12!) , not a single office visit,  and are intended  to address this problem logically,  with an alogrithm dependent on response to empiric treatment at  each progressive step  to determine a specific diagnosis with  minimal addtional testing needed. Therefore if adherence is an issue or can't be accurately verified,  it's very unlikely the standard evaluation and treatment will be successful here.    Furthermore, response to therapy (other than acute cough suppression, which should only be used short term with avoidance of narcotic containing cough syrups if possible), can be a gradual process for which the patient is not likely to  perceive immediate benefit.  Unlike going to an eye doctor where the best perscription is almost always the first one and is immediately effective, this is almost never the case in the management of chronic cough syndromes. Therefore the patient needs to commit up front to consistently adhere to recommendations  for up to 6 weeks of therapy directed at the likely underlying problem(s) before the response can be reasonably evaluated.   rec As above F/u sinus CT next step   F/u with all meds in hand using a trust but verify approach to confirm accurate Medication  Reconciliation The principal here is that until we are certain that the  patients are doing what we've asked, it makes no sense to ask them to do more.

## 2021-12-25 ENCOUNTER — Telehealth: Payer: Self-pay | Admitting: Internal Medicine

## 2021-12-25 MED ORDER — GABAPENTIN 100 MG PO CAPS: 300.0000 mg | ORAL_CAPSULE | Freq: Four times a day (QID) | ORAL | 0 refills | Status: DC

## 2021-12-25 NOTE — Telephone Encounter (Signed)
Patient is requesting a refill on Gabapentin 100mg . He stated that he takes 4 tablets TID. 125 tablets were sent it, which is not a month supply.  I have corrected Rx and sent to preferred pharmacy.  Patient is aware and voiced his understanding Nothing further needed.

## 2021-12-26 ENCOUNTER — Telehealth: Payer: Self-pay | Admitting: Internal Medicine

## 2021-12-26 ENCOUNTER — Other Ambulatory Visit: Payer: Self-pay

## 2021-12-26 MED ORDER — GABAPENTIN 300 MG PO CAPS: 300.0000 mg | ORAL_CAPSULE | Freq: Three times a day (TID) | ORAL | 2 refills | Status: DC

## 2021-12-26 NOTE — Telephone Encounter (Signed)
Dr [name] Please advise :   Patient states he is taking 3 pills 3x daily and says it working well pt called to clarify that this is correct?

## 2021-12-26 NOTE — Telephone Encounter (Signed)
Gabapentin switched to 300mg tid nothing further needed. Sent into patients pharmacy.   ° °Per Dr Wert.  °

## 2021-12-26 NOTE — Telephone Encounter (Signed)
Gabapentin switched to 300mg  tid nothing further needed. Sent into patients pharmacy.    Per Dr Melvyn Novas.

## 2021-12-30 ENCOUNTER — Ambulatory Visit (INDEPENDENT_AMBULATORY_CARE_PROVIDER_SITE_OTHER): Payer: 59 | Admitting: Internal Medicine

## 2021-12-30 ENCOUNTER — Other Ambulatory Visit
Admission: RE | Admit: 2021-12-30 | Discharge: 2021-12-30 | Disposition: A | Discharge status: Home / Self Care | Payer: 59 | Source: Ambulatory Visit | Attending: Internal Medicine | Admitting: Internal Medicine

## 2021-12-30 ENCOUNTER — Encounter: Payer: Self-pay | Admitting: Internal Medicine

## 2021-12-30 ENCOUNTER — Other Ambulatory Visit: Payer: Self-pay

## 2021-12-30 DIAGNOSIS — R058 Other specified cough: Secondary | ICD-10-CM | POA: Diagnosis present

## 2021-12-30 LAB — CBC WITH DIFFERENTIAL/PLATELET
Abs Immature Granulocytes: 0.02 10*3/uL (ref 0.00–0.07)
Basophils Absolute: 0.1 10*3/uL (ref 0.0–0.1)
Basophils Relative: 1 %
Eosinophils Absolute: 0.5 10*3/uL (ref 0.0–0.5)
Eosinophils Relative: 7 %
HCT: 43.2 % (ref 39.0–52.0)
Hemoglobin: 15 g/dL (ref 13.0–17.0)
Immature Granulocytes: 0 %
Lymphocytes Relative: 31 %
Lymphs Abs: 2.1 10*3/uL (ref 0.7–4.0)
MCH: 33 pg (ref 26.0–34.0)
MCHC: 34.7 g/dL (ref 30.0–36.0)
MCV: 94.9 fL (ref 80.0–100.0)
Monocytes Absolute: 0.7 10*3/uL (ref 0.1–1.0)
Monocytes Relative: 11 %
Neutro Abs: 3.4 10*3/uL (ref 1.7–7.7)
Neutrophils Relative %: 50 %
Platelets: 219 10*3/uL (ref 150–400)
RBC: 4.55 MIL/uL (ref 4.22–5.81)
RDW: 13.4 % (ref 11.5–15.5)
WBC: 6.8 10*3/uL (ref 4.0–10.5)
nRBC: 0 % (ref 0.0–0.2)

## 2021-12-30 MED ORDER — PREDNISONE 10 MG PO TABS: ORAL_TABLET | ORAL | 0 refills | Status: DC

## 2021-12-30 MED ORDER — TRAMADOL HCL 50 MG PO TABS: 50.0000 mg | ORAL_TABLET | ORAL | 0 refills | Status: AC | PRN

## 2021-12-30 NOTE — Patient Instructions (Addendum)
Symbicort try off it for now and see if breathing worse or more need for saba in which case restart   GERD (REFLUX)  is an extremely common cause of respiratory symptoms just like yours , many times with no obvious heartburn at all.    It can be treated with medication, but also with lifestyle changes including elevation of the head of your bed (ideally with 6-8inch blocks under the headboard of your bed),  Smoking cessation, avoidance of late meals, excessive alcohol, and avoid fatty foods, chocolate, peppermint, colas, red wine, and acidic juices such as orange juice.  NO MINT OR MENTHOL PRODUCTS SO NO COUGH DROPS  USE SUGARLESS CANDY INSTEAD (Jolley ranchers or Stover's or Life Savers) or even ice chips will also do - the key is to swallow to prevent all throat clearing. NO OIL BASED VITAMINS - use powdered substitutes.  Avoid fish oil when coughing.   Take mucinex dm 1200 mg every  12 hours and supplement if needed with  tramadol 50 mg up to 1-2 every 4 hours to suppress the urge to cough. Swallowing water and/or using ice chips/non mint and menthol containing candies (such as lifesavers or sugarless jolly ranchers) are also effective.  You should rest your voice and avoid activities that you know make you cough.  Prednisone 10 mg take  4 each am x 2 days,   2 each am x 2 days,  1 each am x 2 days and stop   Once you have eliminated the cough for 3 straight days try reducing the tramadol first,  then the mucinex dm as tolerated.   We will schedule a sinus CT and call you with the results   Please remember to go to the lab department   for your tests - we will call you with the results when they are available.      Please schedule a follow up office visit in 4 weeks, sooner if needed

## 2021-12-30 NOTE — Progress Notes (Signed)
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  Has had problems with drippy nose if fall x 2010/ rx also with albuterol    History of Present Illness  11/10/2021  Pulmonary/ 1st office eval/ Benjamin Ford / [facility]  Chief Complaint  Patient presents with   Consult    Cough-  Dyspnea:  walking at normal pace has been difficult since Oct 2022 when caught "bad cold" clear mucus Cough: 2-3 x noct cough/ all daytime / worse with voice / fumes/ ok eating / can't sing anymore s coughing  Sleep: flat with one pillow  SABA use: seems to help p 1st  initiating the cough  Last pred 5 days prior to ov did not help Does not feel zyrtec helped Rec For cough take delsym two tsp every 12 hours as needed to supplement the gabapentin at least for the first week or so and avoid any situation that you know will make your cough  Start gabapentin 100 mg twice daily and add a pill every few days to a maximum of 300 mg 4 x daily or the lowest dose that suppresses even the urge to cough  Prednisone 10 mg take  4 each am x 2 days,   2 each am x 2 days,  1 each am x 2 days and stop (this is to eliminate allergies and inflammation from coughing) Protonix (pantoprazole) Take 30-60 min before first meal of the day and Pepcid 20 mg one bedtime plus chlorpheniramine 4 mg x 2 at bedtime ( available over the counter)  until return  GERD diet reviewed, bed blocks rec  Stop Advair and Only use your albuterol as a rescue medication    Televisit recs  12/03/21 Continue the gabapentin at highest tolerated or 1200 mg whichever comes first  Continue protonix 40 mg Take  30-60 min before first meal of the day and pepcpd 20 mg after supper  For cough > mucinex dm 1200 mg every 12 hours as needed  Augmentin 875 mg take one pill twice daily  X 10 -20 days   Prednisone 10 mg take  4 each am x 2 days,   2 each am x 2 days,  1 each am x 2 days and stop  Stop chlorpheniramine and deslym  Plan A =  Automatic = Always=    Symbicort 80 Take 2 puffs first thing in am and then another 2 puffs about 12 hours later.   Plan B = Backup (to supplement plan A, not to replace it) Only use your albuterol inhaler as a rescue medication  Call if not improved after augmentin call me for sinus ct - if improved but not completely better take another 10 days  Keep your previous appt with me and bring your medications including over the counters    12/30/2021  f/u ov/Benjamin Ford/ [facility] re: cough since Oct 2022 but really goes back to 2013   maint on 900 mg daily   Chief Complaint  Patient presents with   Follow-up    Sob with exertion, prod cough with green to brown  sputum and wheezing.    Dyspnea:  symbicort has not helped sob  Cough: green thick mucus tbsp no change p 20 d of augmentin   Sleeping: side sleeper  SABA use: none now  02: none  Covid status:  vax x 3    No obvious day to day or daytime variability or assoc   mucus plugs or hemoptysis or cp or chest tightness,  or overt sinus or hb symptoms.     Also denies any obvious fluctuation of symptoms with weather or environmental changes or other aggravating or alleviating factors except as outlined above   No unusual exposure hx or h/o childhood pna/ asthma or knowledge of premature birth.  Current Allergies, Complete Past Medical History, Past Surgical History, Family History, and Social History were reviewed in [employer] record.  ROS  The following are not active complaints unless bolded Hoarseness, sore throat, dysphagia, dental problems, itching, sneezing,  nasal congestion or  discharge of excess mucus or purulent secretions, ear ache,   fever, chills, sweats, unintended wt loss or wt gain, classically pleuritic or exertional cp,  orthopnea pnd or arm/hand swelling  or leg swelling, presyncope, palpitations, abdominal pain, anorexia, nausea, vomiting, diarrhea  or change in bowel habits or change in bladder habits, change in stools or change in urine, dysuria, hematuria,  rash, arthralgias, visual complaints, headache, numbness, weakness or ataxia or problems with walking or coordination,  change in mood or  memory.        Current Meds  Medication Sig   albuterol (VENTOLIN HFA) 108 (90 Base) MCG/ACT inhaler Inhale 2 puffs into the lungs every 6 (six) hours as needed for wheezing or shortness of breath.   budesonide-formoterol (SYMBICORT) 80-4.5 MCG/ACT inhaler Take 2 puffs first thing in am and then another 2 puffs about 12 hours later.   famotidine (PEPCID) 20 MG tablet One after supper   fluticasone (FLONASE) 50 MCG/ACT nasal spray Place 2 sprays into both nostrils daily.   gabapentin (NEURONTIN) 300 MG capsule Take 1 capsule (300 mg total) by mouth 3 (three) times daily.   pantoprazole (PROTONIX) 40 MG tablet Take 1 tablet (40 mg total) by mouth daily. Take 30-60 min before first meal of the day   triamcinolone cream (KENALOG) 0.1 % Apply 1 application topically 2 (two) times daily.   [DISCONTINUED] predniSONE (DELTASONE) 10 MG tablet Take  4 each am x 2 days,   2 each am x 2 days,  1 each am x 2 days and stop            Past Medical History:  Diagnosis Date   Anxiety    Asthma    BPH (benign prostatic hyperplasia)    GERD (gastroesophageal reflux disease)    HTN (hypertension)    Insomnia    Palpitations    Stomach ulcer    Testicular mass         Objective:     Wt Readings from Last 3 Encounters:  12/30/21 172 lb (78 kg)  12/03/21 161 lb (73 kg)  11/10/21 172 lb 3.2 oz (78.1 kg)      Vital signs reviewed  12/30/2021    General appearance:     healthy appearing amb wm nad   HEENT : pt wearing mask not removed for exam due to covid -19 concerns.    NECK :  without JVD/Nodes/TM/ nl carotid upstrokes bilaterally   LUNGS: no acc muscle use,  Nl contour chest which is clear to A  and P bilaterally without cough on insp or exp maneuvers   CV:  RRR  no s3 or murmur or increase in P2, and no edema   ABD:  soft and nontender with nl inspiratory excursion in the supine position. No bruits or organomegaly appreciated, bowel sounds nl  MS:  Nl gait/ ext warm without deformities, calf tenderness, cyanosis or clubbing No obvious joint restrictions   SKIN: warm and dry without lesions    NEURO:  alert, approp, nl sensorium with  no motor or cerebellar deficits apparent.       Assessment

## 2022-01-03 LAB — IGE: IgE (Immunoglobulin E), Serum: 201 IU/mL (ref 6–495)

## 2022-01-09 NOTE — Progress Notes (Signed)
01/12/22 10:00 AM   Benjamin Ford [birth year] [id number]  Referring provider:  No referring provider defined for this encounter. Chief Complaint  Patient presents with   Follow-up    [facility]mh follow-up    Urological history: Family history of prostate cancer  - Brother and father diagnosed in their 561's - PSA 0.72 on 07/07/2021  2. BPH  - IPSS 0/0 - DRE was reassuring   3. Epididymal cysts  -scrotal ultrasound 2015 multiple simple appearing cystic structures right epididymis likely benign cyst.  HPI: Benjamin Tonkinsonis a 61y.o.male who presents today for a 6 month follow-up with IPSS, PSA, and exam.   He denies any modifying or aggravating factors.  Patient denies any gross hematuria, dysuria or suprapubic/flank pain.  Patient denies any fevers, chills, nausea or vomiting.     IPSS     Row Name 01/12/22 0900         International Prostate Symptom Score   How often have you had the sensation of not emptying your bladder? Not at All     How often have you had to urinate less than every two hours? Not at All     How often have you found you stopped and started again several times when you urinated? Not at All     How often have you found it difficult to postpone urination? Not at All     How often have you had a weak urinary stream? Not at All     How often have you had to strain to start urination? Not at All     How many times did you typically get up at night to urinate? None     Total IPSS Score 0       Quality of Life due to urinary symptoms   If you were to spend the rest of your life with your urinary condition just the way it is now how would you feel about that? Delighted              Score:  1-7 Mild 8-19 Moderate 20-35 Severe   PMH: Past Medical History:  Diagnosis Date   Anxiety    Asthma    BPH (benign prostatic hyperplasia)    GERD (gastroesophageal reflux disease)    HTN (hypertension)    Insomnia    Palpitations    Stomach ulcer     Testicular mass     Surgical History: Past Surgical History:  Procedure Laterality Date   COLONOSCOPY WITH PROPOFOL N/A 05/27/2020   Procedure: COLONOSCOPY WITH PROPOFOL;  Surgeon: AJonathon Bellows MD;  [facility];  Service: Gastroenterology;  Laterality: N/A;   INGUINAL HERNIA REPAIR  2008   MASTECTOMY Bilateral 1992/1981   VASECTOMY  1983    Home Medications:  Allergies as of 01/12/2022   No Known Allergies      Medication List        Accurate as of January 12, 2022 10:00 AM. If you have any questions, ask your nurse or doctor.          STOP taking these medications    predniSONE 10 MG tablet Commonly known as: DELTASONE Stopped by: SHANNON MCGOWAN, PA-C       TAKE these medications    albuterol 108 (90 Base) MCG/ACT inhaler Commonly known as: VENTOLIN HFA Inhale 2 puffs into the lungs every 6 (six) hours as needed for wheezing or shortness of breath.   budesonide-formoterol 80-4.5 MCG/ACT inhaler Commonly known as: Symbicort  Take 2 puffs first thing in am and then another 2 puffs about 12 hours later.   famotidine 20 MG tablet Commonly known as: Pepcid One after supper   fluticasone 50 MCG/ACT nasal spray Commonly known as: FLONASE Place 2 sprays into both nostrils daily.   gabapentin 300 MG capsule Commonly known as: NEURONTIN Take 1 capsule (300 mg total) by mouth 3 (three) times daily.   pantoprazole 40 MG tablet Commonly known as: Protonix Take 1 tablet (40 mg total) by mouth daily. Take 30-60 min before first meal of the day   triamcinolone cream 0.1 % Commonly known as: KENALOG Apply 1 application topically 2 (two) times daily.        Allergies:  No Known Allergies  Family History: Family History  Problem Relation Age of Onset   Prostate cancer Father    Cancer Father        bone, prostate   COPD Mother    Cancer Mother        lung   Cancer Maternal Grandmother        brain   Aneurysm Paternal Grandmother     Prostate cancer Brother    Prostate cancer Brother    Kidney disease Neg Hx    Bladder Cancer Neg Hx     Social History:  reports that he quit smoking about 24 years ago. His smoking use included cigarettes. He has a 14.00 pack-year smoking history. He has never used smokeless tobacco. He reports current alcohol use. He reports that he does not use drugs.   Physical Exam: BP (!) 187/105    Pulse 70    Ht 5' 6" (1.676 m)    Wt 169 lb (76.7 kg)    BMI 27.28 kg/m   Constitutional:  Alert and oriented, No acute distress. HEENT: Boomer AT, moist mucus membranes.  Trachea midline, no masses. Cardiovascular: No clubbing, cyanosis, or edema. Respiratory: Normal respiratory effort, no increased work of breathing. GU: No CVA tenderness Rectal: Normal sphincter tone, circumcised,  smooth no nodules. Prostate exam was approximately 50 cc. I could only palpate the apex and mid portion of the gland Lymph: No cervical or inguinal lymphadenopathy. Skin: No rashes, bruises or suspicious lesions. Neurologic: Grossly intact, no focal deficits, moving all 4 extremities. Psychiatric: Normal mood and affect.  Laboratory Data:  Lab Results  Component Value Date   CREATININE 0.98 03/19/2021   Component     Latest Ref Rng & Units 12/30/2021  WBC     4.0 - 10.5 K/uL 6.8  RBC     4.22 - 5.81 MIL/uL 4.55  Hemoglobin     13.0 - 17.0 g/dL 15.0  HCT     39.0 - 52.0 % 43.2  MCV     80.0 - 100.0 fL 94.9  MCH     26.0 - 34.0 pg 33.0  MCHC     30.0 - 36.0 g/dL 34.7  RDW     11.5 - 15.5 % 13.4  Platelets     150 - 400 K/uL 219  nRBC     0.0 - 0.2 % 0.0  Neutrophils     % 50  NEUT#     1.7 - 7.7 K/uL 3.4  Lymphocytes     % 31  Lymphocyte #     0.7 - 4.0 K/uL 2.1  Monocytes Relative     % 11  Monocyte #     0.1 - 1.0 K/uL 0.7  Eosinophil     %  7  Eosinophils Absolute     0.0 - 0.5 K/uL 0.5  Basophil     % 1  Basophils Absolute     0.0 - 0.1 K/uL 0.1  Immature Granulocytes     % 0  Abs  Immature Granulocytes     0.00 - 0.07 K/uL 0.02  Lymphs     Not Estab. %   Monocytes     Not Estab. %   Eos     Not Estab. %   Basos     Not Estab. %   Monocytes Absolute     0.1 - 0.9 x10E3/uL   EOS (ABSOLUTE)     0.0 - 0.4 x10E3/uL   Immature Grans (Abs)     0.0 - 0.1 x10E3/uL    Component     Latest Ref Rng & Units 07/07/2021  Prostatic Specific Antigen     0.00 - 4.00 ng/mL 0.72   Component     Latest Ref Rng & Units 03/19/2021  Glucose     65 - 99 mg/dL 91  BUN     8 - 27 mg/dL 17  Creatinine     0.76 - 1.27 mg/dL 0.98  eGFR     >59 mL/min/1.73 88  BUN/Creatinine Ratio     10 - 24 17  Sodium     134 - 144 mmol/L 143  Potassium     3.5 - 5.2 mmol/L 5.2  Chloride     96 - 106 mmol/L 104  CO2     20 - 29 mmol/L 22  Calcium     8.6 - 10.2 mg/dL 9.7  Total Protein     6.0 - 8.5 g/dL 7.0  Albumin     3.8 - 4.9 g/dL 4.9  Globulin, Total     1.5 - 4.5 g/dL 2.1  Albumin/Globulin Ratio     1.2 - 2.2 2.3 (H)  Total Bilirubin     0.0 - 1.2 mg/dL 0.5  Alkaline Phosphatase     44 - 121 IU/L 46  AST     0 - 40 IU/L 27  ALT     0 - 44 IU/L 37   Urinalysis   Pertinent Imaging:   Assessment & Plan:   BPH  - No complaints at this time  -Continue bi-annual exam and PSA  -IPSS SCORE 0/0  - PSA today will call with results   2. Family history of prostate cancer - father and brother both diagnosed with prostate cancer's in their 55's   Return in about 6 months (around 07/12/2022) for IPSS, PSA and exam.  [facility] [address] [place], [place] [address] [phone]  [place] as a scribe for [facility], PA-C.,have documented all relevant documentation on the behalf of [place], PA-C,as directed by  [facility], PA-C while in the presence of Pavan Bring, PA-C.

## 2022-01-09 NOTE — Progress Notes (Signed)
Error

## 2022-01-12 ENCOUNTER — Encounter: Payer: Self-pay | Admitting: Urology

## 2022-01-12 ENCOUNTER — Other Ambulatory Visit: Payer: Self-pay | Admitting: *Deleted

## 2022-01-12 ENCOUNTER — Other Ambulatory Visit
Admission: RE | Admit: 2022-01-12 | Discharge: 2022-01-12 | Disposition: A | Discharge status: Home / Self Care | Payer: 59 | Attending: Urology | Admitting: Urology

## 2022-01-12 ENCOUNTER — Ambulatory Visit: Payer: 59 | Admitting: Urology

## 2022-01-12 ENCOUNTER — Other Ambulatory Visit: Payer: Self-pay

## 2022-01-12 VITALS — BP 187/105 | HR 70 | Ht 66.0 in | Wt 169.0 lb

## 2022-01-12 DIAGNOSIS — N138 Other obstructive and reflux uropathy: Secondary | ICD-10-CM

## 2022-01-12 DIAGNOSIS — Z8042 Family history of malignant neoplasm of prostate: Secondary | ICD-10-CM | POA: Diagnosis not present

## 2022-01-12 DIAGNOSIS — N401 Enlarged prostate with lower urinary tract symptoms: Secondary | ICD-10-CM

## 2022-01-14 ENCOUNTER — Other Ambulatory Visit: Payer: Self-pay

## 2022-01-14 ENCOUNTER — Ambulatory Visit
Admission: RE | Admit: 2022-01-14 | Discharge: 2022-01-14 | Disposition: A | Discharge status: Home / Self Care | Payer: 59 | Source: Ambulatory Visit | Attending: Internal Medicine | Admitting: Internal Medicine

## 2022-01-14 DIAGNOSIS — R058 Other specified cough: Secondary | ICD-10-CM | POA: Insufficient documentation

## 2022-01-14 LAB — PSA: Prostatic Specific Antigen: 0.53 ng/mL (ref 0.00–4.00)

## 2022-01-26 ENCOUNTER — Telehealth: Payer: Self-pay | Admitting: Internal Medicine

## 2022-01-26 ENCOUNTER — Encounter: Payer: Self-pay | Admitting: Internal Medicine

## 2022-01-26 DIAGNOSIS — R058 Other specified cough: Secondary | ICD-10-CM

## 2022-01-26 NOTE — Telephone Encounter (Signed)
Patient called and states he is still having issues with his breathing in regards to feeling like something in his throat and in his chest. He states he would like to proceed with further testing that was offered to him in January.   I am unable to find what test was needed in January.    Dr [name] please advise:

## 2022-01-26 NOTE — Telephone Encounter (Signed)
Called and spoke to patient, order placed for a ref to an allergist.

## 2022-01-26 NOTE — Assessment & Plan Note (Signed)
Onset was 2013 but persistent/daily  since covid xmas 2021 and URI Oct 2022  -  Cyclical cough rx with gabapentin/ gerd rx 11/10/2021 >>> mucus turned green  -  12/03/2021 rx augmentin x 10-20 days/ pred x 6 days, start symbicort 80 2bid as using up to 60 puffs a week of albuterol - Allergy profile  12/30/21  >  Eos 0.5 /  IgE  201    With pos allergy profile rec proceed with sinus CT and if neg > allergy eval next   In meantim try leaving off symbicort and max rx directed at GERD/ cyclical cough see avs for instructions unique to this ov           Each maintenance medication was reviewed in detail including emphasizing most importantly the difference between maintenance and prns and under what circumstances the prns are to be triggered using an action plan format where appropriate.  Total time for H and P, chart review, counseling,  and generating customized AVS unique to this office visit / same day charting  > 30 min

## 2022-01-26 NOTE — Telephone Encounter (Signed)
My notes say we referred to allergy and I usually use [facility] but [facility] is fine if he want to see someone in [place]  - doubt they can see him before next ov 02/12/22 which his next ov with me but go ahead and submit it if not already done

## 2022-02-03 ENCOUNTER — Other Ambulatory Visit: Payer: Self-pay | Admitting: Internal Medicine

## 2022-02-03 DIAGNOSIS — R058 Other specified cough: Secondary | ICD-10-CM

## 2022-02-12 ENCOUNTER — Encounter: Payer: 59 | Admitting: Internal Medicine

## 2022-02-12 NOTE — Telephone Encounter (Signed)
Created in error

## 2022-02-12 NOTE — Progress Notes (Signed)
 This encounter was created in error - please disregard.

## 2022-05-04 ENCOUNTER — Other Ambulatory Visit: Payer: Self-pay | Admitting: Internal Medicine

## 2022-05-04 DIAGNOSIS — R058 Other specified cough: Secondary | ICD-10-CM

## 2022-07-06 ENCOUNTER — Other Ambulatory Visit: Payer: Self-pay

## 2022-07-06 DIAGNOSIS — Z8042 Family history of malignant neoplasm of prostate: Secondary | ICD-10-CM

## 2022-07-06 DIAGNOSIS — N138 Other obstructive and reflux uropathy: Secondary | ICD-10-CM

## 2022-07-13 ENCOUNTER — Ambulatory Visit: Payer: 59 | Admitting: Urology

## 2022-07-27 ENCOUNTER — Ambulatory Visit: Payer: 59 | Admitting: Urology

## 2022-07-29 ENCOUNTER — Other Ambulatory Visit
Admission: RE | Admit: 2022-07-29 | Discharge: 2022-07-29 | Disposition: A | Discharge status: Home / Self Care | Payer: 59 | Attending: Urology | Admitting: Urology

## 2022-07-29 DIAGNOSIS — N401 Enlarged prostate with lower urinary tract symptoms: Secondary | ICD-10-CM | POA: Diagnosis present

## 2022-07-29 DIAGNOSIS — Z8042 Family history of malignant neoplasm of prostate: Secondary | ICD-10-CM | POA: Diagnosis present

## 2022-07-29 DIAGNOSIS — N138 Other obstructive and reflux uropathy: Secondary | ICD-10-CM | POA: Diagnosis present

## 2022-07-29 LAB — PSA: Prostatic Specific Antigen: 1.15 ng/mL (ref 0.00–4.00)

## 2022-08-02 NOTE — Progress Notes (Unsigned)
08/03/22 1:51 PM   Benjamin Ford 11/10/1961 [id number]  Referring provider:  Gwyneth Sprout, [address] [place],  [place] [address]  Urological history: 1. Prostate cancer screening  -PSA (07/2022) - 1.15 -baseline PSA (2015) 0.6 at age 61 -Brother and father diagnosed in their 38's   2. BPH  -PSA (07/2022) - 1.15 -IPSS 0/0  3. Epididymal cysts  -scrotal ultrasound 2015 multiple simple appearing cystic structures right epididymis likely benign cyst.  Chief Complaint  Patient presents with   Benign Prostatic Hypertrophy     HPI: Benjamin Ford is a 61 y.o.male who presents today for a 6 month follow-up.  He has no urinary complaints at this time.  Patient denies any modifying or aggravating factors.  Patient denies any gross hematuria, dysuria or suprapubic/flank pain.  Patient denies any fevers, chills, nausea or vomiting.     IPSS     Row Name 08/03/22 1300         International Prostate Symptom Score   How often have you had the sensation of not emptying your bladder? Not at All     How often have you had to urinate less than every two hours? Not at All     How often have you found you stopped and started again several times when you urinated? Not at All     How often have you found it difficult to postpone urination? Not at All     How often have you had a weak urinary stream? Not at All     How often have you had to strain to start urination? Not at All     How many times did you typically get up at night to urinate? None     Total IPSS Score 0       Quality of Life due to urinary symptoms   If you were to spend the rest of your life with your urinary condition just the way it is now how would you feel about that? Delighted               Score:  1-7 Mild 8-19 Moderate 20-35 Severe   PMH: Past Medical History:  Diagnosis Date   Anxiety    Asthma    BPH (benign prostatic hyperplasia)    GERD (gastroesophageal reflux disease)    HTN  (hypertension)    Insomnia    Palpitations    Stomach ulcer    Testicular mass     Surgical History: Past Surgical History:  Procedure Laterality Date   COLONOSCOPY WITH PROPOFOL N/A 05/27/2020   Procedure: COLONOSCOPY WITH PROPOFOL;  Surgeon: [name], MD;  Location: [facility] ENDOSCOPY;  Service: Gastroenterology;  Laterality: N/A;   INGUINAL HERNIA REPAIR  2008   MASTECTOMY Bilateral 1992/1981   VASECTOMY  1983    Home Medications:  Allergies as of 08/03/2022   No Known Allergies      Medication List        Accurate as of August 03, 2022  1:51 PM. If you have any questions, ask your nurse or doctor.          STOP taking these medications    budesonide-formoterol 80-4.5 MCG/ACT inhaler Commonly known as: Symbicort Stopped by: SHANNON MCGOWAN, PA-C   fluticasone 50 MCG/ACT nasal spray Commonly known as: FLONASE Stopped by: SHANNON MCGOWAN, PA-C   gabapentin 300 MG capsule Commonly known as: NEURONTIN Stopped by: Zara Council, PA-C       TAKE these medications  albuterol 108 (90 Base) MCG/ACT inhaler Commonly known as: VENTOLIN HFA Inhale 2 puffs into the lungs every 6 (six) hours as needed for wheezing or shortness of breath.   amLODipine 5 MG tablet Commonly known as: NORVASC TAKE 1 TABLET(5 MG) BY MOUTH EVERY DAY   famotidine 20 MG tablet Commonly known as: Pepcid One after supper   olmesartan 20 MG tablet Commonly known as: BENICAR Take 20 mg by mouth daily.   pantoprazole 40 MG tablet Commonly known as: PROTONIX TAKE 1 TABLET(40 MG) BY MOUTH DAILY 30 TO 60 MINUTES BEFORE FIRST MEAL OF THE DAY   triamcinolone cream 0.1 % Commonly known as: KENALOG Apply 1 application topically 2 (two) times daily.        Allergies:  No Known Allergies  Family History: Family History  Problem Relation Age of Onset   Prostate cancer Father    Cancer Father        bone, prostate   COPD Mother    Cancer Mother        lung   Cancer Maternal  Grandmother        brain   Aneurysm Paternal Grandmother    Prostate cancer Brother    Prostate cancer Brother    Kidney disease Neg Hx    Bladder Cancer Neg Hx     Social History:  reports that he quit smoking about 24 years ago. His smoking use included cigarettes. He has a 14.00 pack-year smoking history. He has never used smokeless tobacco. He reports current alcohol use. He reports that he does not use drugs.   Physical Exam: BP (!) 182/93 (BP Location: Left Arm, Patient Position: Sitting, Cuff Size: Normal) Comment: Just started BP meds x 3days ago  Pulse 66   Ht 5' 6" (1.676 m)   Wt 170 lb (77.1 kg)   BMI 27.44 kg/m   Constitutional:  Well nourished. Alert and oriented, No acute distress. HEENT: [place] AT, moist mucus membranes.  Trachea midline Cardiovascular: No clubbing, cyanosis, or edema. Respiratory: Normal respiratory effort, no increased work of breathing. GU: No CVA tenderness.  No bladder fullness or masses.  Patient with circumcised phallus.  Urethral meatus is patent.  No penile discharge. No penile lesions or rashes. Scrotum without lesions, cysts, rashes and/or edema.  Testicles are located scrotally bilaterally. No masses are appreciated in the testicles. Left and right epididymis are normal. Rectal: Patient with  normal sphincter tone. Anus and perineum without scarring or rashes. No rectal masses are appreciated. Prostate is approximately 50 grams, could only palpate the apex of the gland,no nodules are appreciated. Seminal vesicles could not be palpated Neurologic: Grossly intact, no focal deficits, moving all 4 extremities. Psychiatric: Normal mood and affect.   Laboratory Data:  Prostatic Specific Antigen  Latest Ref Rng 0.00 - 4.00 ng/mL  06/05/2019 0.75   12/07/2019 0.65   06/21/2020 0.63   01/06/2021 0.71   07/07/2021 0.72   01/12/2022 0.53   07/29/2022 1.15     WBC (White Blood Cell Count) 4.1 - 10.2 10^3/uL 4.9   RBC (Red Blood Cell Count) 4.69 - 6.13  10^6/uL 4.73   Hemoglobin 14.1 - 18.1 gm/dL 15.4   Hematocrit 40.0 - 52.0 % 45.8   MCV (Mean Corpuscular Volume) 80.0 - 100.0 fl 96.8   MCH (Mean Corpuscular Hemoglobin) 27.0 - 31.2 pg 32.6 High    MCHC (Mean Corpuscular Hemoglobin Concentration) 32.0 - 36.0 gm/dL 33.6   Platelet Count 150 - 450 10^3/uL 194   RDW-CV (  Red Cell Distribution Width) 11.6 - 14.8 % 13.2   MPV (Mean Platelet Volume) 9.4 - 12.4 fl 11.9   Neutrophils 1.50 - 7.80 10^3/uL 2.60   Lymphocytes 1.00 - 3.60 10^3/uL 1.70   Mixed Count 0.10 - 0.90 10^3/uL 0.60   Neutrophil % 32.0 - 70.0 % 53.7   Lymphocyte % 10.0 - 50.0 % 34.4   Mixed % 3.0 - 14.4 % 11.9   Resulting Agency  [facility] - LAB   Specimen Collected: 07/22/22 08:09   Performed by: [facility] Last Resulted: 07/22/22 09:03  Received From: [facility]  Result Received: 07/29/22 12:29   Glucose 70 - 110 mg/dL 108   Sodium 136 - 145 mmol/L 137   Potassium 3.6 - 5.1 mmol/L 4.6   Chloride 97 - 109 mmol/L 103   Carbon Dioxide (CO2) 22.0 - 32.0 mmol/L 25.9   Urea Nitrogen (BUN) 7 - 25 mg/dL 16   Creatinine 0.7 - 1.3 mg/dL 0.7   Glomerular Filtration Rate (eGFR), MDRD Estimate >60 mL/min/1.73sq m 114   Calcium 8.7 - 10.3 mg/dL 9.9   AST  8 - 39 U/L 27   ALT  6 - 57 U/L 34   Alk Phos (alkaline Phosphatase) 34 - 104 U/L 39   Albumin 3.5 - 4.8 g/dL 4.8   Bilirubin, Total 0.3 - 1.2 mg/dL 0.4   Protein, Total 6.1 - 7.9 g/dL 6.9   A/G Ratio 1.0 - 5.0 gm/dL 2.3   Resulting Agency  [facility] - LAB   Specimen Collected: 07/22/22 08:09   Performed by: [facility]: 07/22/22 14:46  Received From: [facility]  Result Received: 07/29/22 12:29   Color Yellow, Violet, Light Violet, Dark Violet Yellow   Clarity Clear, Other Clear   Specific Gravity 1.000 - 1.030 1.015   pH, Urine 5.0 - 8.0 7.0   Protein, Urinalysis Negative, Trace mg/dL Negative   Glucose, Urinalysis  Negative mg/dL Negative   Ketones, Urinalysis Negative mg/dL Trace Abnormal    Blood, Urinalysis Negative Negative   Nitrite, Urinalysis Negative Negative   Leukocyte Esterase, Urinalysis Negative Negative   White Blood Cells, Urinalysis None Seen, 0-3 /hpf None Seen   Red Blood Cells, Urinalysis None Seen, 0-3 /hpf None Seen   Bacteria, Urinalysis None Seen /hpf None Seen   Squamous Epithelial Cells, Urinalysis Rare, Few, None Seen /hpf None Seen   Resulting Agency  [facility] - LAB  Narrative Performed by [facility] - LAB Large mucous.   Specimen Collected: 07/22/22 08:09   Performed by: [facility] Last Resulted: 07/22/22 13:20  Received From: [facility]  Result Received: 07/29/22 12:29   Cholesterol, Total 100 - 200 mg/dL 202 High    Triglyceride 35 - 199 mg/dL 89   HDL (High Density Lipoprotein) Cholesterol 29.0 - 71.0 mg/dL 89.7 High    LDL Calculated 0 - 130 mg/dL 95   VLDL Cholesterol mg/dL 18   Cholesterol/HDL Ratio  2.3   Resulting Agency  [facility] - LAB   Specimen Collected: 07/22/22 08:09   Performed by: [facility]: 07/22/22 14:33  Received From: [facility]  Result Received: 07/29/22 12:29   Thyroid Stimulating Hormone (TSH) 0.450-5.330 uIU/ml uIU/mL 2.041   Resulting Agency  [facility] - [facility]   Specimen Collected: 02/06/22 12:49 Last Resulted: 02/06/22 15:50  Received From:  [facility]  Result Received: 02/12/22 06:18  I have reviewed the labs.   Pertinent Imaging: N/A  Assessment & Plan:    1. BPH with LUTS -PSA increased from baseline -DRE benign -UA benign -continue conservative management, avoiding bladder irritants and timed voiding's    2. Prostate cancer screening -PSA has increased a bit from his levels 6 months ago -repeat the PSA in 8 weeks for confirmation of elevation  -father and brother both  diagnosed with prostate cancer's in their 6's    Return in about 6 months (around 02/03/2023) for IPSS, PSA and exam.  Zara Council, PA-C   [facility] [address], [place], [place] [address] [phone]

## 2022-08-03 ENCOUNTER — Encounter: Payer: Self-pay | Admitting: Urology

## 2022-08-03 ENCOUNTER — Other Ambulatory Visit: Payer: Self-pay

## 2022-08-03 ENCOUNTER — Ambulatory Visit (INDEPENDENT_AMBULATORY_CARE_PROVIDER_SITE_OTHER): Payer: 59 | Admitting: Urology

## 2022-08-03 VITALS — BP 182/93 | HR 66 | Ht 66.0 in | Wt 170.0 lb

## 2022-08-03 DIAGNOSIS — Z8042 Family history of malignant neoplasm of prostate: Secondary | ICD-10-CM

## 2022-08-03 DIAGNOSIS — Z125 Encounter for screening for malignant neoplasm of prostate: Secondary | ICD-10-CM

## 2022-08-03 DIAGNOSIS — N138 Other obstructive and reflux uropathy: Secondary | ICD-10-CM | POA: Diagnosis not present

## 2022-08-03 DIAGNOSIS — N401 Enlarged prostate with lower urinary tract symptoms: Secondary | ICD-10-CM | POA: Diagnosis not present

## 2022-09-25 ENCOUNTER — Other Ambulatory Visit: Payer: Self-pay

## 2022-09-25 DIAGNOSIS — R058 Other specified cough: Secondary | ICD-10-CM

## 2022-09-28 ENCOUNTER — Other Ambulatory Visit
Admission: RE | Admit: 2022-09-28 | Discharge: 2022-09-28 | Disposition: A | Discharge status: Home / Self Care | Payer: 59 | Attending: Urology | Admitting: Urology

## 2022-09-28 DIAGNOSIS — N138 Other obstructive and reflux uropathy: Secondary | ICD-10-CM | POA: Insufficient documentation

## 2022-09-28 DIAGNOSIS — Z8042 Family history of malignant neoplasm of prostate: Secondary | ICD-10-CM | POA: Insufficient documentation

## 2022-09-28 DIAGNOSIS — N401 Enlarged prostate with lower urinary tract symptoms: Secondary | ICD-10-CM | POA: Diagnosis present

## 2022-09-28 LAB — PSA: Prostatic Specific Antigen: 0.98 ng/mL (ref 0.00–4.00)

## 2022-09-29 IMAGING — CR DG CHEST 2V
1 series · 3 of 3 positions shown · non-contrast
Comparison: 12/01/2020

CLINICAL DATA: Cough

EXAM:
CHEST - 2 VIEW

[Series 1: dg chest 2 view · 0.14mm/px · 3 of 3 slices shown]
[im 1/3]
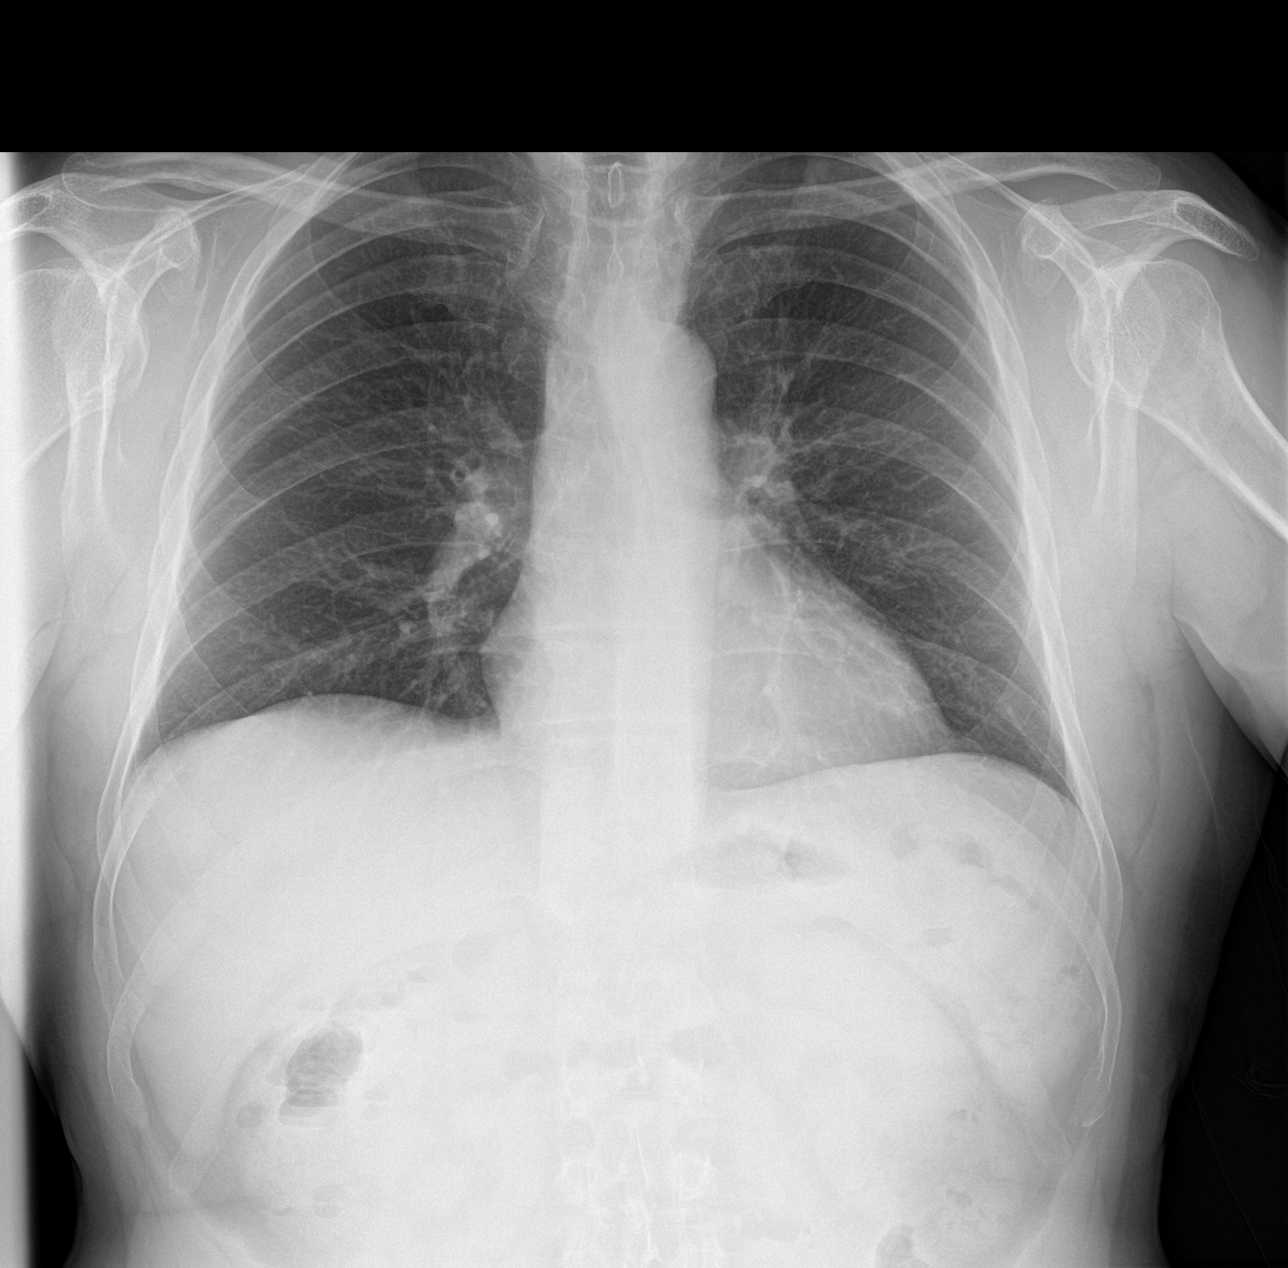
[im 2/3]
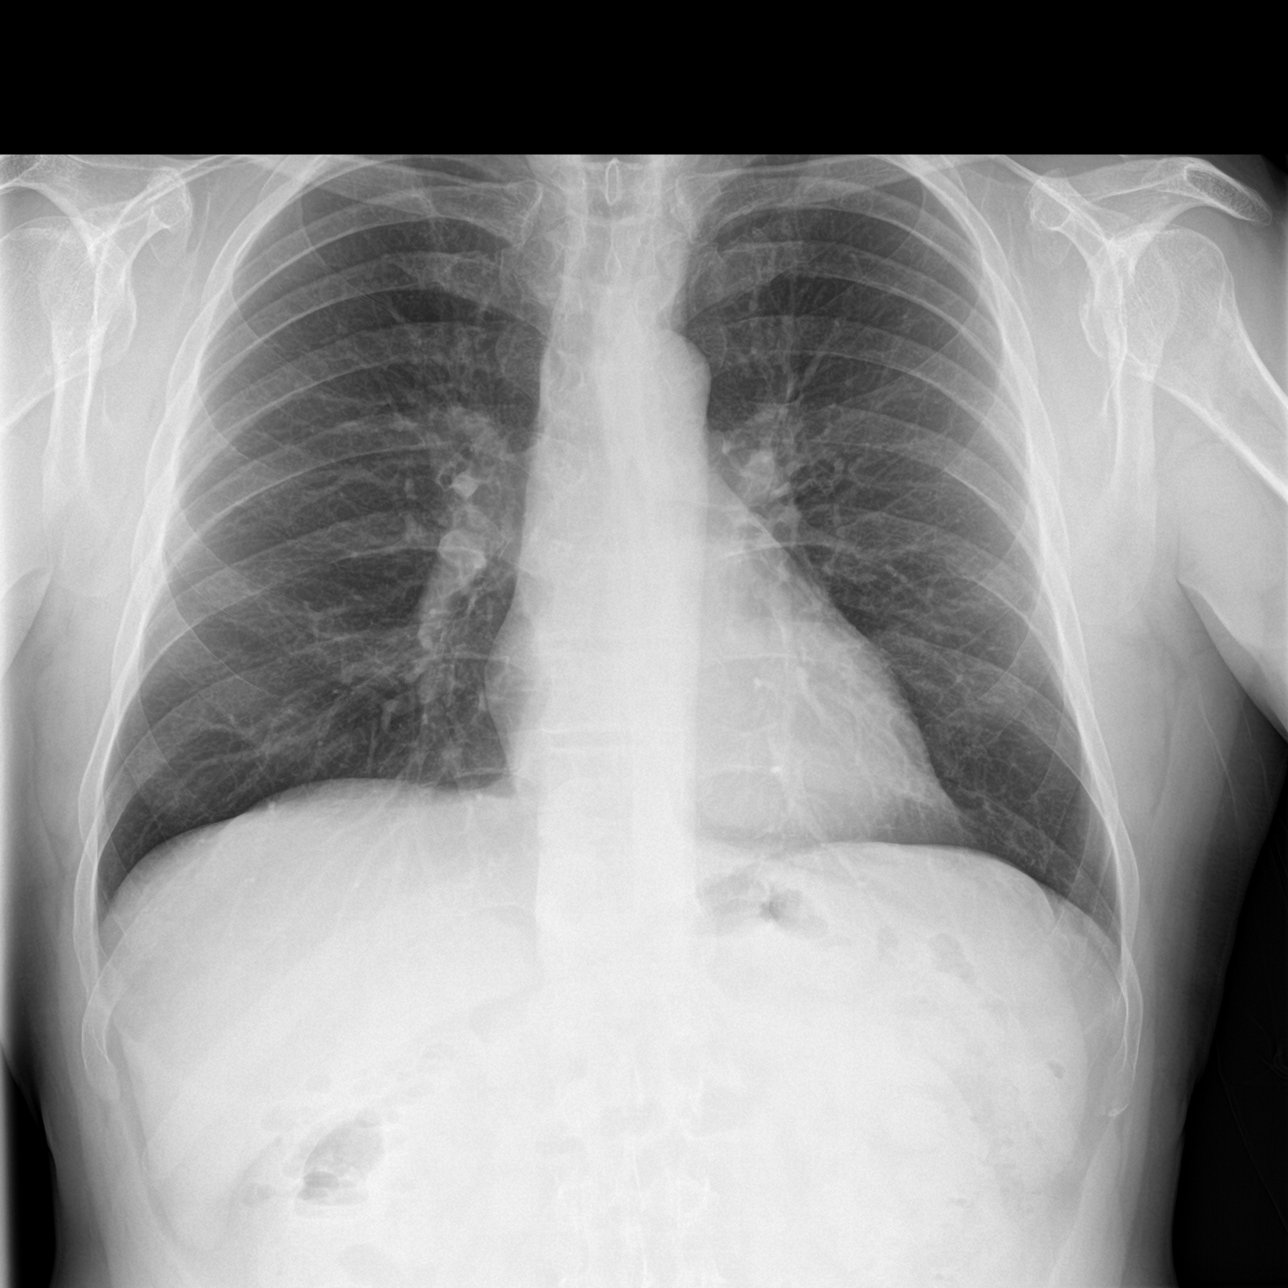
[im 3/3]
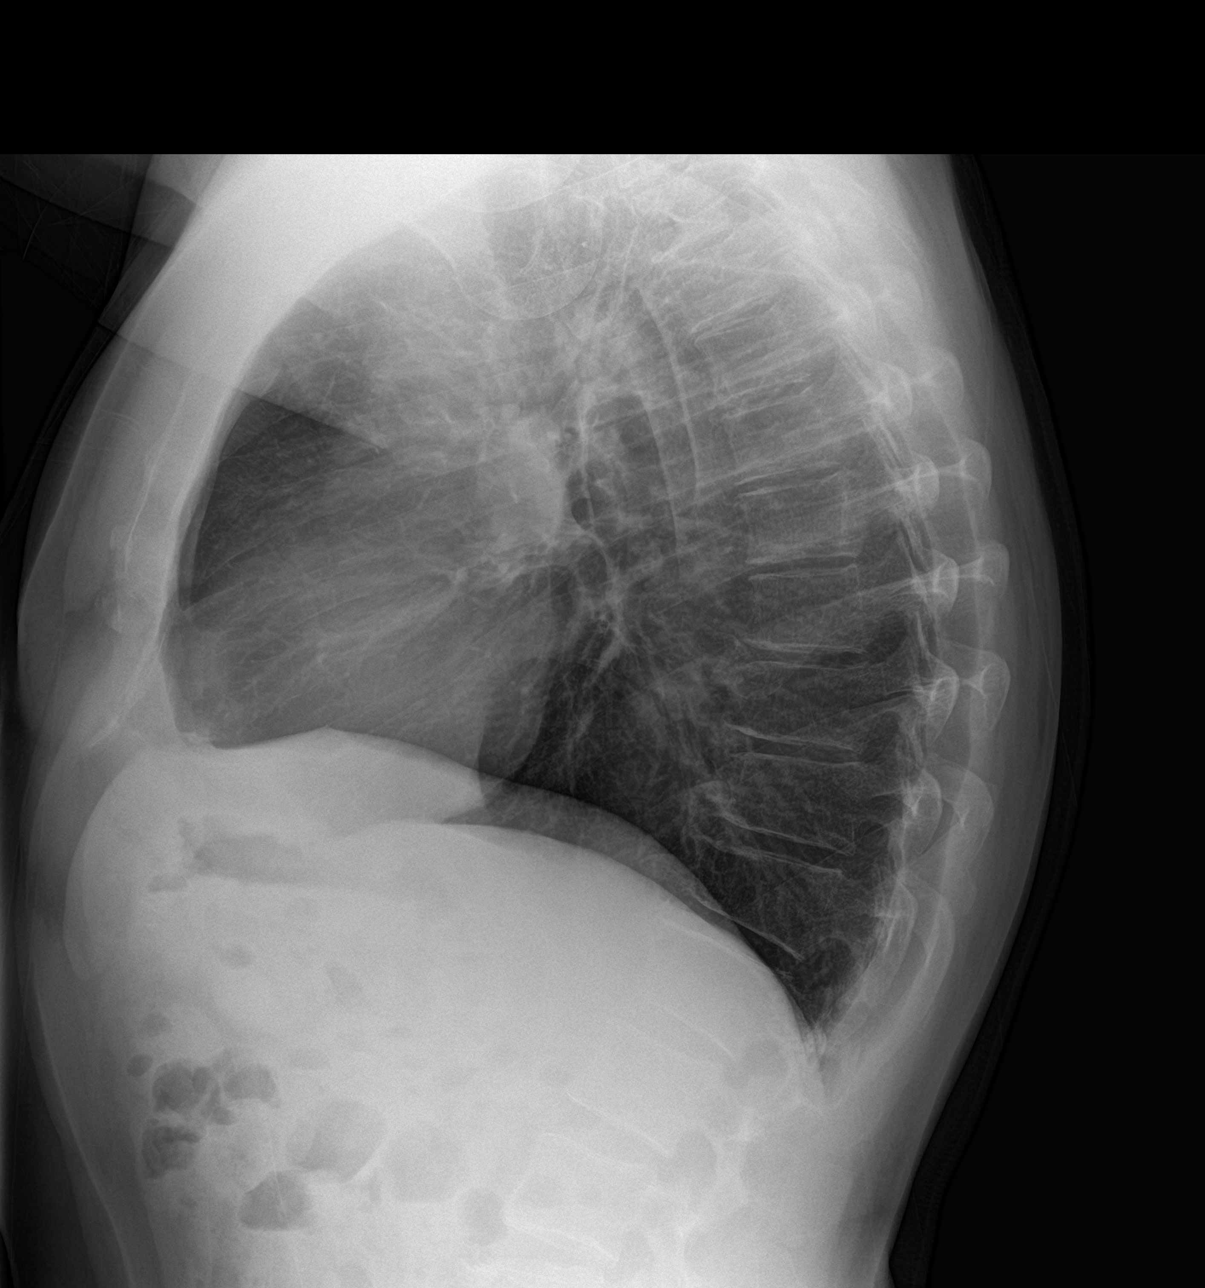

[3 of 3 positions shown; findings below may reference images not displayed]

FINDINGS: The heart size and mediastinal contours are within normal limits.
Both lungs are clear. The visualized skeletal structures are
unremarkable.
IMPRESSION: Normal study.

## 2022-10-12 ENCOUNTER — Telehealth: Payer: Self-pay | Admitting: *Deleted

## 2022-10-12 NOTE — Telephone Encounter (Signed)
-----   Message from Nori Riis, PA-C sent at 10/12/2022  9:55 AM EST ----- Please let Mr. Hammack know that his PSA is trending downward and we will see him in February.

## 2022-10-12 NOTE — Telephone Encounter (Signed)
Notified patient as instructed, patient pleased. Discussed follow-up appointments, patient agrees  

## 2023-01-29 NOTE — Progress Notes (Signed)
02/01/23 9:24 AM   Benjamin Ford 1961-11-10 [id number]  Referring provider:  Jacky Kindle, FNP [address] [place],  [place] [address]  Urological history: 1. Prostate cancer screening  -PSA pending  -baseline PSA (2015) 0.6 at age 62 -Brother and father diagnosed in their 39's   2. BPH  -IPSS 1/0  3. Epididymal cysts  -scrotal ultrasound 2015 multiple simple appearing cystic structures right epididymis likely benign cyst.  Chief Complaint  Patient presents with   Benign Prostatic Hypertrophy     HPI: Benjamin Ford is a 62 y.o.male who presents today for a 6 month follow-up.   IPSS     Row Name 02/01/23 0900         International Prostate Symptom Score   How often have you had the sensation of not emptying your bladder? Not at All     How often have you had to urinate less than every two hours? Not at All     How often have you found you stopped and started again several times when you urinated? Not at All     How often have you found it difficult to postpone urination? Not at All     How often have you had a weak urinary stream? Not at All     How often have you had to strain to start urination? Not at All     How many times did you typically get up at night to urinate? 1 Time     Total IPSS Score 1       Quality of Life due to urinary symptoms   If you were to spend the rest of your life with your urinary condition just the way it is now how would you feel about that? Delighted                Score:  1-7 Mild 8-19 Moderate 20-35 Severe   PMH: Past Medical History:  Diagnosis Date   Anxiety    Asthma    BPH (benign prostatic hyperplasia)    GERD (gastroesophageal reflux disease)    HTN (hypertension)    Insomnia    Palpitations    Stomach ulcer    Testicular mass     Surgical History: Past Surgical History:  Procedure Laterality Date   COLONOSCOPY WITH PROPOFOL N/A 05/27/2020   Procedure: COLONOSCOPY WITH PROPOFOL;  Surgeon:  Wyline Mood, MD;  Location: [facility] ENDOSCOPY;  Service: Gastroenterology;  Laterality: N/A;   INGUINAL HERNIA REPAIR  2008   MASTECTOMY Bilateral 1992/1981   VASECTOMY  1983    Home Medications:  Allergies as of 02/01/2023   No Known Allergies      Medication List        Accurate as of February 01, 2023  9:24 AM. If you have any questions, ask your nurse or doctor.          STOP taking these medications    amLODipine 5 MG tablet Commonly known as: NORVASC Stopped by: Michiel Cowboy, PA-C       TAKE these medications    albuterol 108 (90 Base) MCG/ACT inhaler Commonly known as: VENTOLIN HFA Inhale 2 puffs into the lungs every 6 (six) hours as needed for wheezing or shortness of breath.   famotidine 20 MG tablet Commonly known as: Pepcid One after supper   olmesartan 20 MG tablet Commonly known as: BENICAR Take 20 mg by mouth daily.   pantoprazole 40 MG tablet Commonly known as: PROTONIX TAKE 1  TABLET(40 MG) BY MOUTH DAILY 30 TO 60 MINUTES BEFORE FIRST MEAL OF THE DAY   triamcinolone cream 0.1 % Commonly known as: KENALOG Apply 1 application topically 2 (two) times daily.        Allergies:  No Known Allergies  Family History: Family History  Problem Relation Age of Onset   Prostate cancer Father    Cancer Father        bone, prostate   COPD Mother    Cancer Mother        lung   Cancer Maternal Grandmother        brain   Aneurysm Paternal Grandmother    Prostate cancer Brother    Prostate cancer Brother    Kidney disease Neg Hx    Bladder Cancer Neg Hx     Social History:  reports that he quit smoking about 25 years ago. His smoking use included cigarettes. He has a 14.00 pack-year smoking history. He has never used smokeless tobacco. He reports current alcohol use. He reports that he does not use drugs.   Physical Exam: BP (!) 175/94   Pulse 65   Ht 5\' 6"  (1.676 m)   Wt 170 lb (77.1 kg)   BMI 27.44 kg/m   Constitutional:  Well  nourished. Alert and oriented, No acute distress. HEENT: [place] AT, moist mucus membranes.  Trachea midline Cardiovascular: No clubbing, cyanosis, or edema. Respiratory: Normal respiratory effort, no increased work of breathing. GU: No CVA tenderness.  No bladder fullness or masses.  Patient with circumcised phallus.  Urethral meatus is patent.  No penile discharge. No penile lesions or rashes. Scrotum without lesions, cysts, rashes and/or edema.  Testicles are located scrotally bilaterally. No masses are appreciated in the testicles. Left and right epididymis are normal.  Right spermatocele noted.  Rectal: Patient with  normal sphincter tone. Anus and perineum without scarring or rashes. No rectal masses are appreciated. Prostate is approximately 40 + grams, could only palpate the apex and the midportion of the gland, no nodules are appreciated. Seminal vesicles could not be palpated Neurologic: Grossly intact, no focal deficits, moving all 4 extremities. Psychiatric: Normal mood and affect.   Laboratory Data: Pending I have reviewed the labs.   Pertinent Imaging: N/A  Assessment & Plan:    1. BPH with LUTS -PSA pending -DRE benign -continue conservative management, avoiding bladder irritants and timed voiding's    2. Prostate cancer screening -father and brother both diagnosed with prostate cancer's in their 85's  -his other brother just had a biopsy and they are waiting on the results    Return in about 6 months (around 08/02/2023) for IPSS, PSA and exam.  Michiel Cowboy, PA-C   [facility] [address], [address] [place], [place] [address] [phone]

## 2023-02-01 ENCOUNTER — Encounter: Payer: Self-pay | Admitting: Urology

## 2023-02-01 ENCOUNTER — Other Ambulatory Visit
Admission: RE | Admit: 2023-02-01 | Discharge: 2023-02-01 | Disposition: A | Discharge status: Home / Self Care | Payer: 59 | Attending: Urology | Admitting: Urology

## 2023-02-01 ENCOUNTER — Other Ambulatory Visit: Payer: Self-pay | Admitting: Family Medicine

## 2023-02-01 ENCOUNTER — Encounter: Payer: Self-pay | Admitting: Family Medicine

## 2023-02-01 ENCOUNTER — Ambulatory Visit (INDEPENDENT_AMBULATORY_CARE_PROVIDER_SITE_OTHER): Payer: 59 | Admitting: Urology

## 2023-02-01 VITALS — BP 175/94 | HR 65 | Ht 66.0 in | Wt 170.0 lb

## 2023-02-01 DIAGNOSIS — Z8042 Family history of malignant neoplasm of prostate: Secondary | ICD-10-CM | POA: Diagnosis not present

## 2023-02-01 DIAGNOSIS — N138 Other obstructive and reflux uropathy: Secondary | ICD-10-CM | POA: Insufficient documentation

## 2023-02-01 DIAGNOSIS — N401 Enlarged prostate with lower urinary tract symptoms: Secondary | ICD-10-CM

## 2023-02-01 LAB — PSA: Prostatic Specific Antigen: 0.98 ng/mL (ref 0.00–4.00)

## 2023-05-23 IMAGING — CR DG CHEST 2V
1 series · 2 of 2 positions shown · non-contrast
Comparison: X-ray chest 03/19/2021.

CLINICAL DATA: Cough for 6-8 weeks and notes he feels like he
cannot catch his breath.

EXAM:
CHEST - 2 VIEW

[Series 1: dg chest 2 view · 0.14mm/px · 2 of 2 slices shown]
[im 1/2]
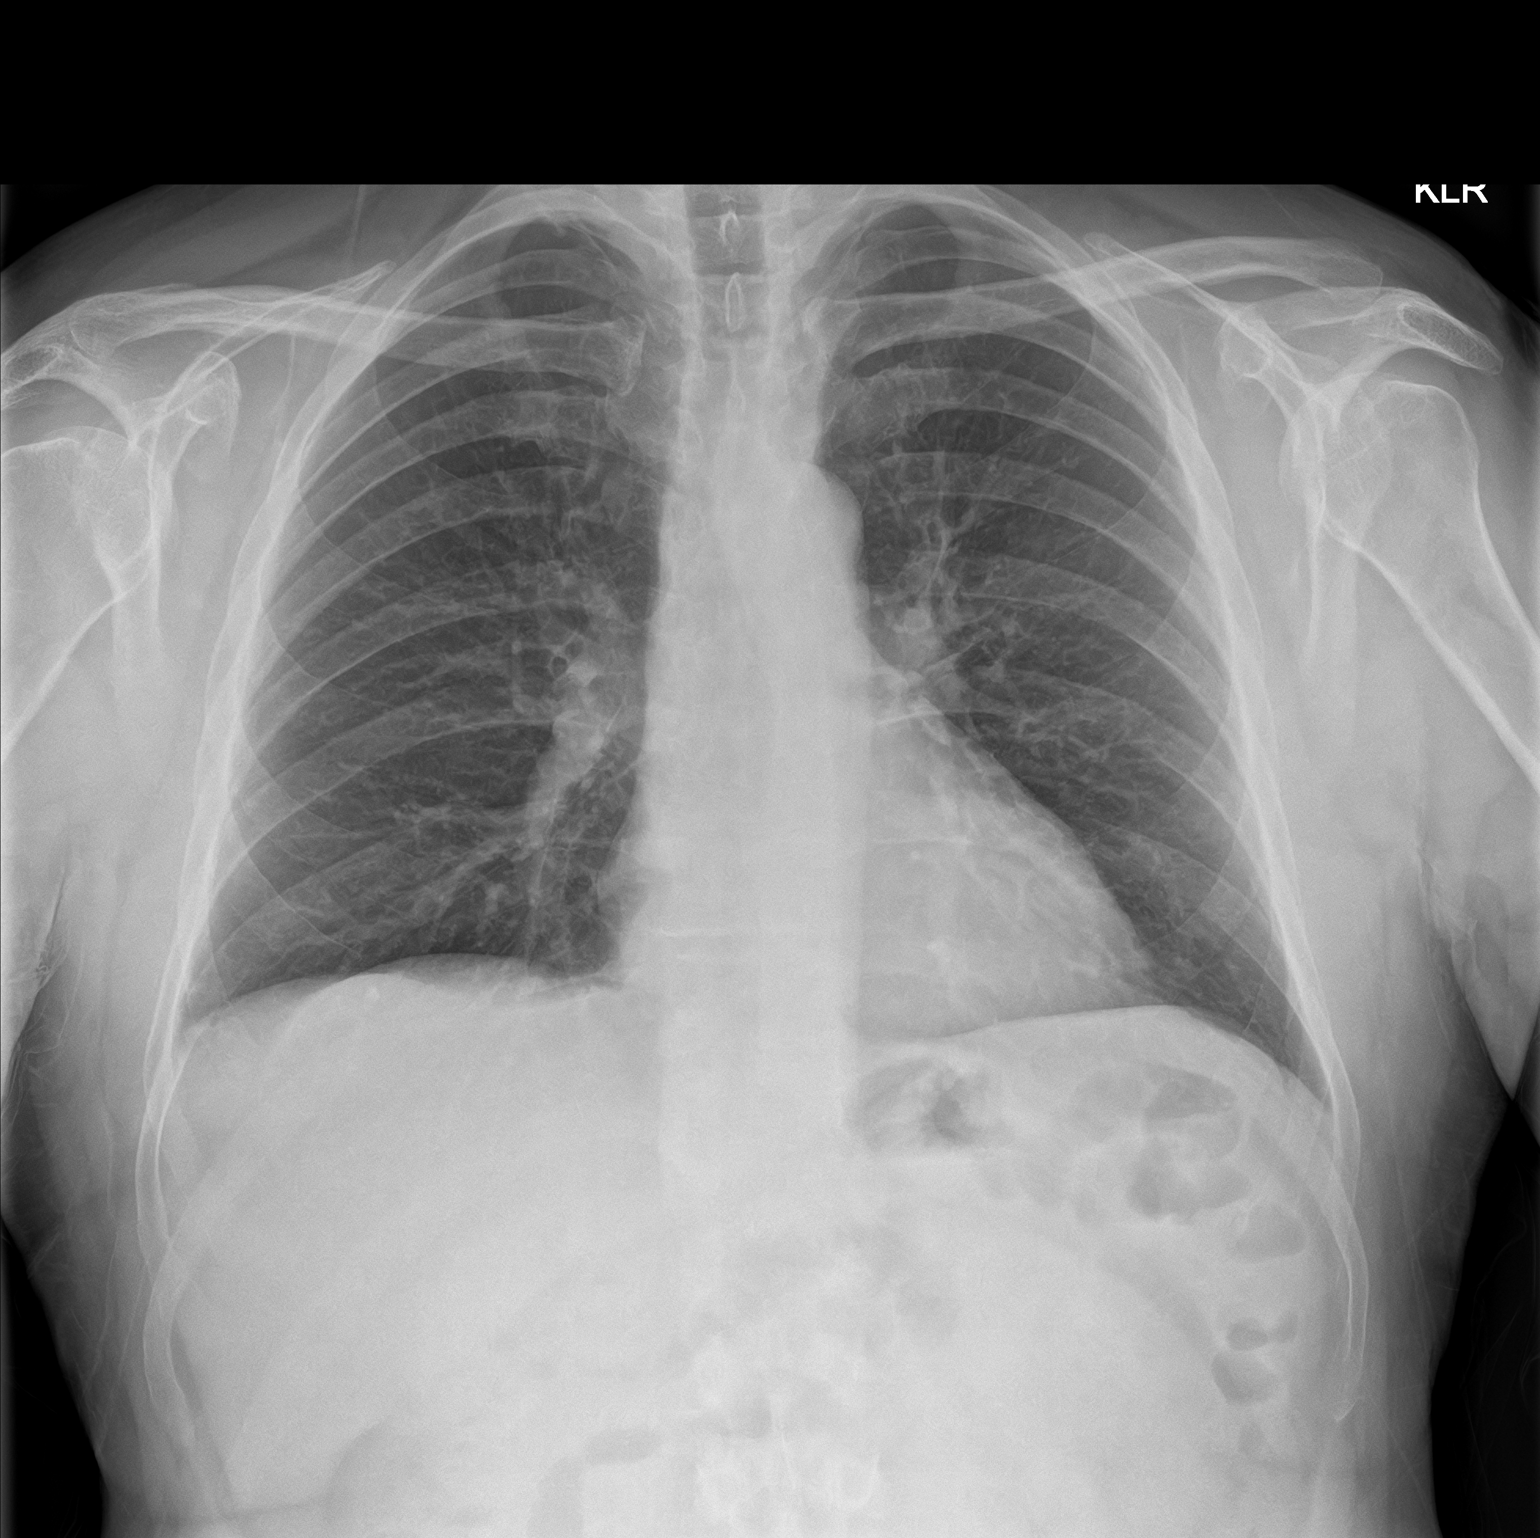
[im 2/2]
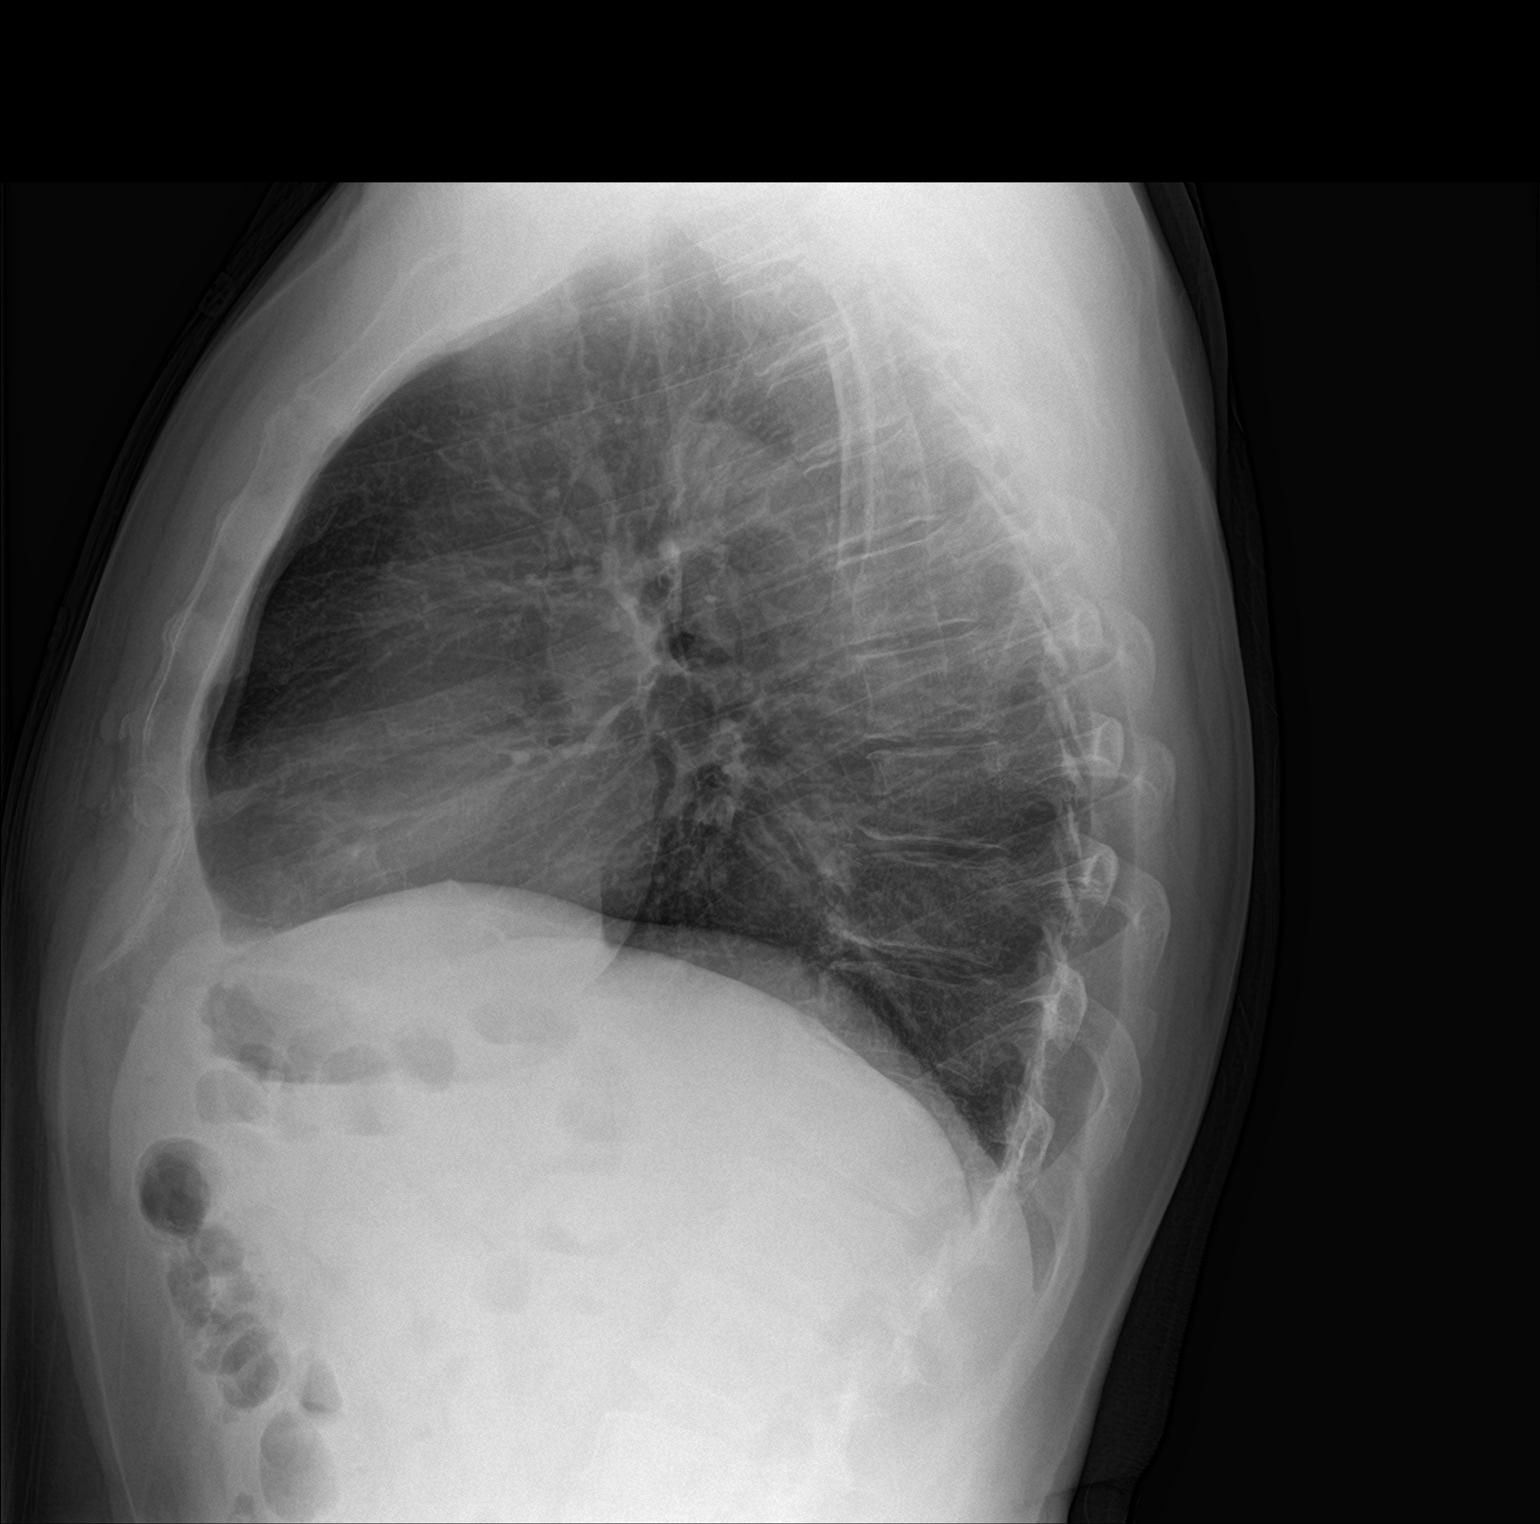

[2 of 2 positions shown; findings below may reference images not displayed]

FINDINGS: Unchanged cardiomediastinal silhouette. No focal airspace disease.
The no pleural effusion. No visible pneumothorax. No acute osseous
abnormality. Thoracic spondylosis.
IMPRESSION: No evidence of acute cardiopulmonary disease.

## 2023-07-14 ENCOUNTER — Other Ambulatory Visit: Payer: Self-pay | Admitting: Family Medicine

## 2023-07-14 DIAGNOSIS — I82402 Acute embolism and thrombosis of unspecified deep veins of left lower extremity: Secondary | ICD-10-CM

## 2023-07-27 IMAGING — CT CT PARANASAL SINUSES LIMITED
3 of 5 series · 12 of 47 positions shown, 14 images · non-contrast
Comparison: None.

CLINICAL DATA: Maxillary/facial abscess. Congestion and tightness
in the sinuses. Difficulty breathing.

EXAM:
CT PARANASAL SINUS LIMITED WITHOUT CONTRAST
TECHNIQUE: Non-contiguous multidetector CT images of the paranasal sinuses were
obtained in a single plane without contrast.
RADIATION DOSE REDUCTION: This exam was performed according to the
departmental dose-optimization program which includes automated
exposure control, adjustment of the mA and/or kV according to
patient size and/or use of iterative reconstruction technique.

[Series 6: sinus 2.00 cor · coronal · 0.19mm/px · 3 of 92 slices shown]
[im 31/92  bone]
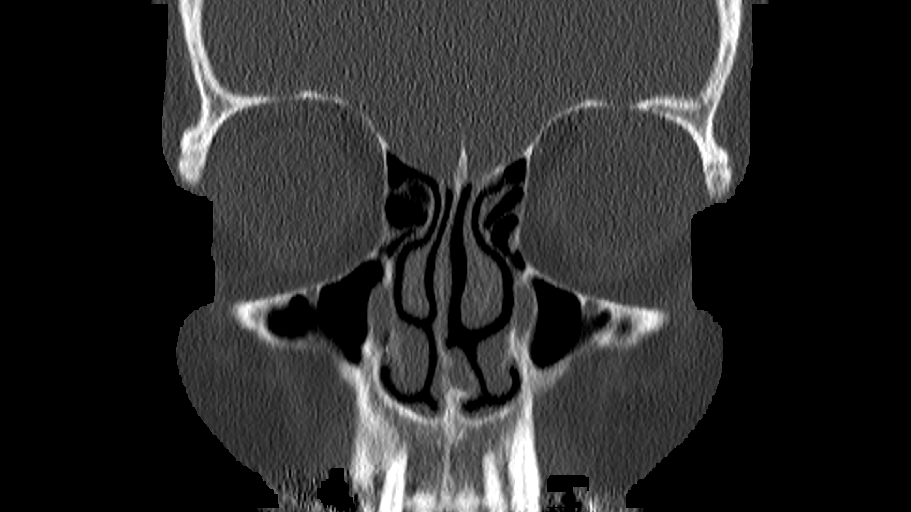
[im 41/92  bone]
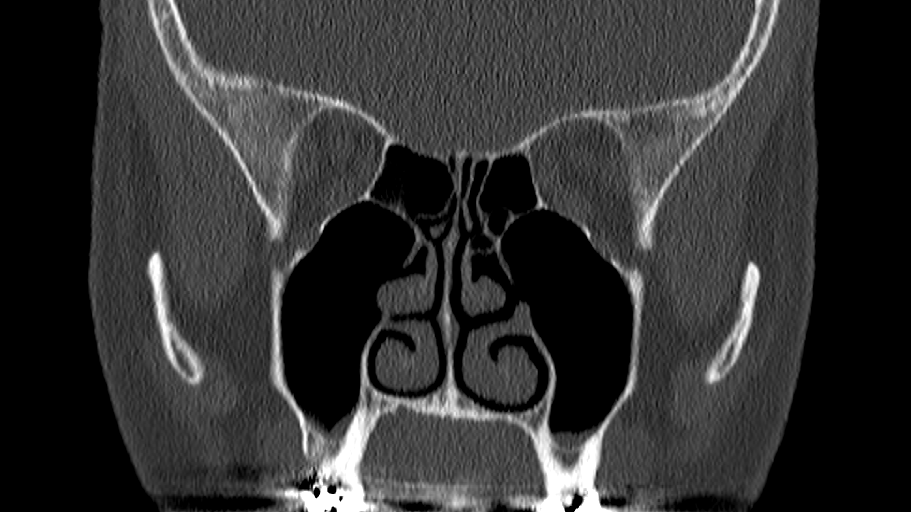
[im 51/92  bone]
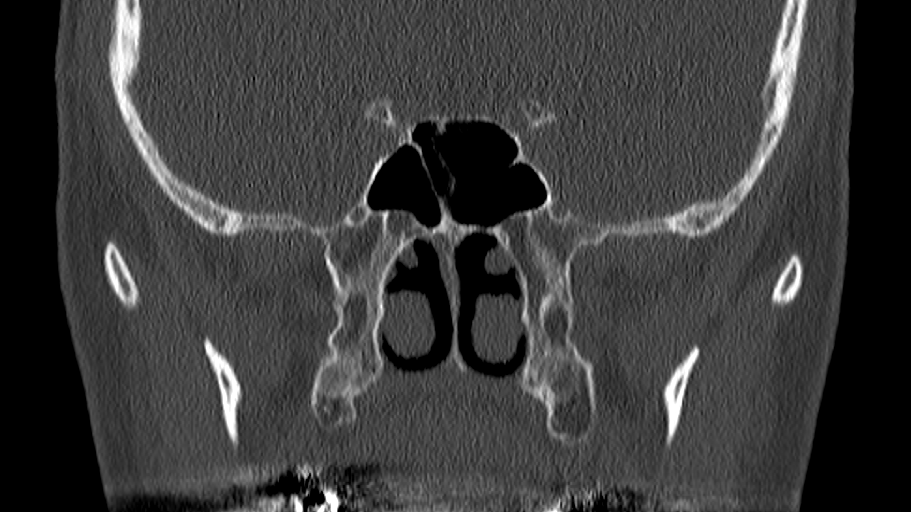

[Series 8: sinus 2.00 sag · sagittal · 0.19mm/px · 3 of 87 slices shown]
[im 29/87  bone]
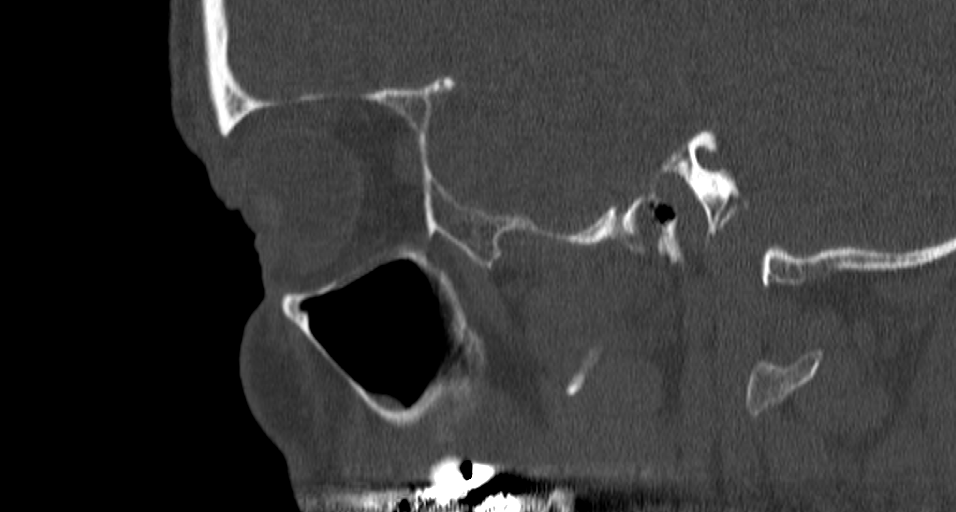
[im 44/87  bone]
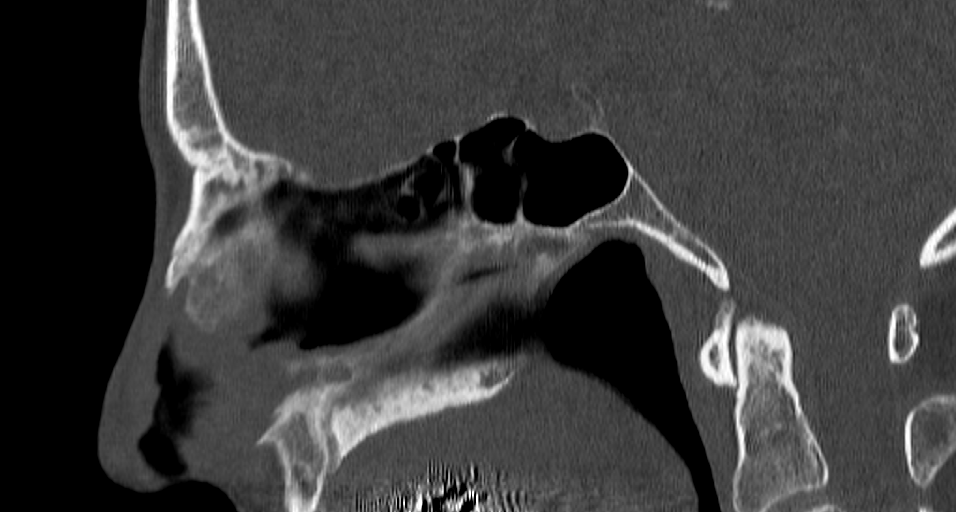
[im 58/87  bone]
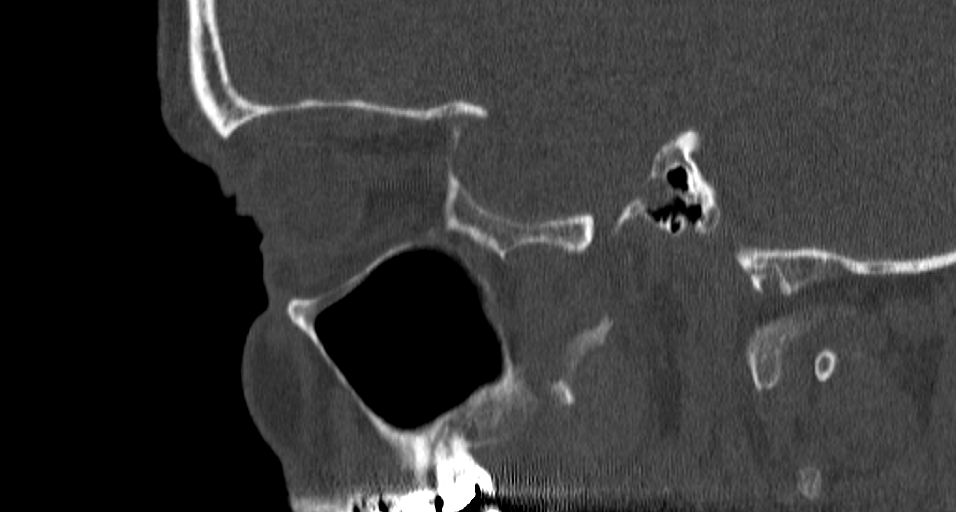

[Series 10: thins sinus 1.00 ax · axial · 0.33mm/px · z∈[-807,-737]mm · 6 of 98 slices shown, 8 images]
[im 14/98  brain]
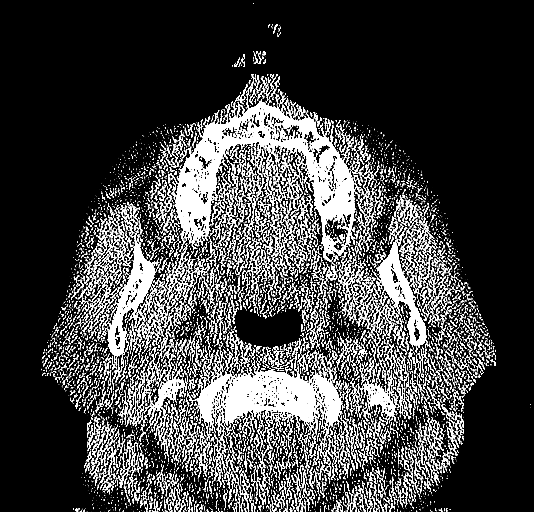
[im 14/98  bone]
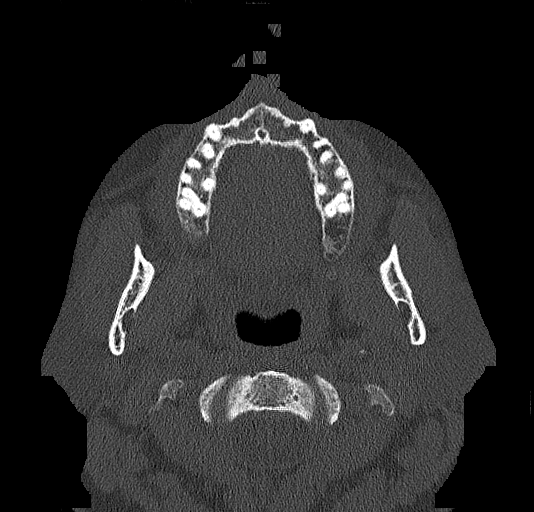
[im 28/98  bone]
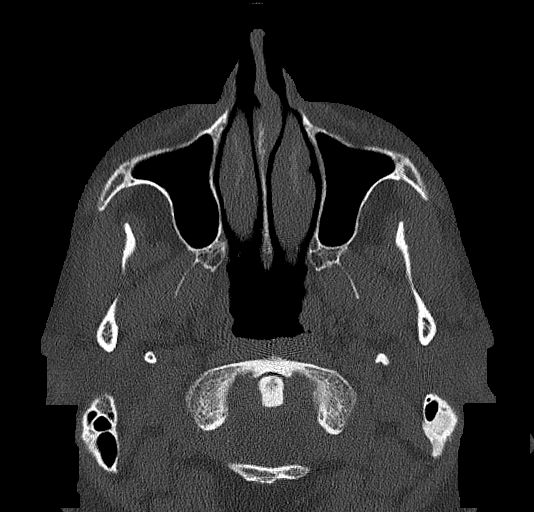
[im 42/98  bone]
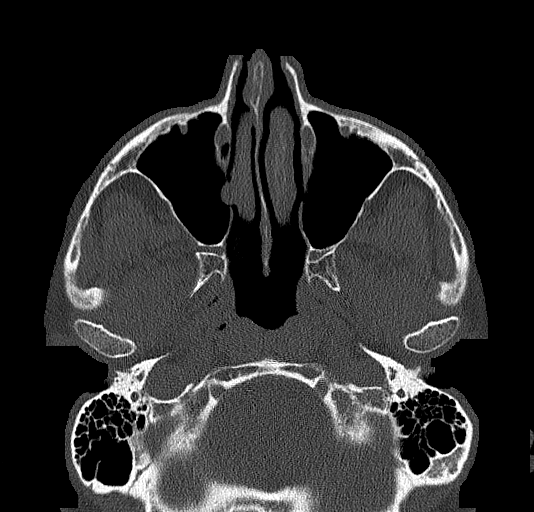
[im 56/98  bone]
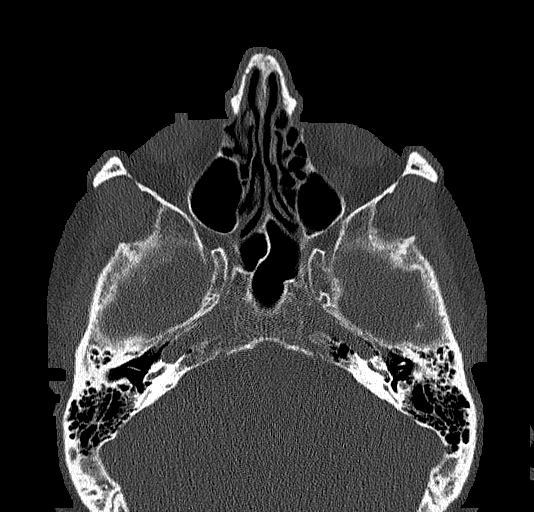
[im 70/98  brain]
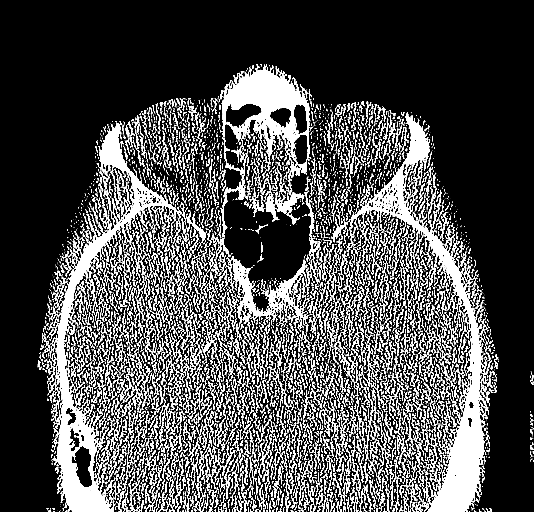
[im 70/98  bone]
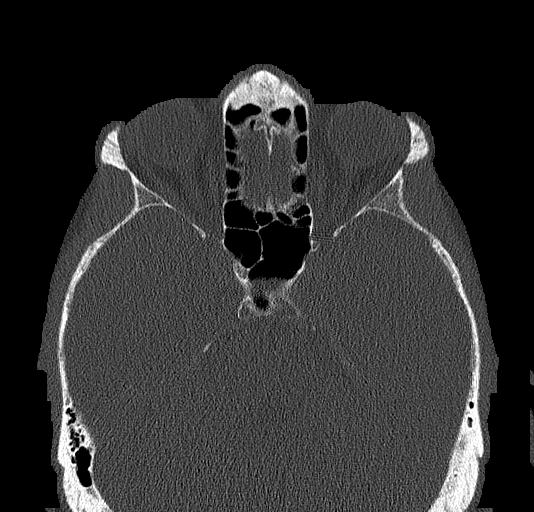
[im 84/98  bone]
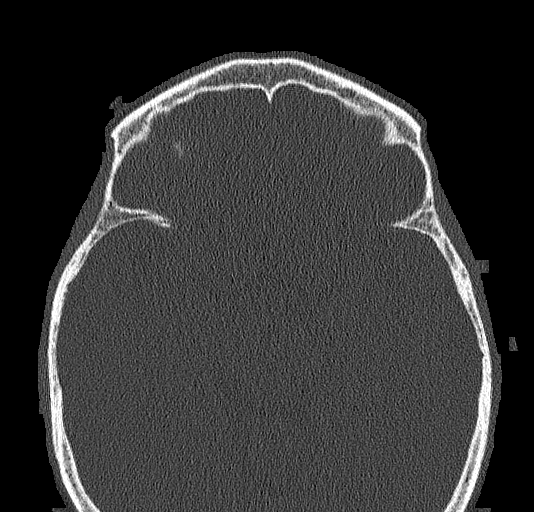

[12 of 47 positions shown; findings below may reference images not displayed]

FINDINGS: Osseous: No facial fracture.

Orbits: Normal.

Sinuses

--Frontal: Arrested pneumatization.

--Ethmoid: No fluid levels or mucosal thickening.

--Sphenoid: No fluid levels or mucosal thickening.

--Maxillary: No fluid levels or mucosal thickening. Ostiomeatal
complexes are patent.

--Nasal cavity: No significant septal deviation or abnormal mucosal
thickening.

--Nasopharynx: Clear.

--Mastoid air cells: No effusion.

Soft tissues: Normal.

Limited intracranial: Normal.
IMPRESSION: Clear paranasal sinuses.

## 2023-07-30 ENCOUNTER — Other Ambulatory Visit: Payer: Self-pay | Admitting: Urology

## 2023-07-30 DIAGNOSIS — Z8042 Family history of malignant neoplasm of prostate: Secondary | ICD-10-CM

## 2023-07-30 DIAGNOSIS — N138 Other obstructive and reflux uropathy: Secondary | ICD-10-CM

## 2023-07-30 NOTE — Progress Notes (Unsigned)
08/02/23 1:17 PM   Benjamin Ford 01/22/1961 [id number]  Referring provider:  Jacky Kindle, FNP [address] [place],  [place] [address]  Urological history: 1. Prostate cancer screening  -PSA pending  -baseline PSA (2015) 0.6 at age 62 -Brother and father diagnosed in their 36's   2. BPH   3. Epididymal cysts  -scrotal ultrasound 2015 multiple simple appearing cystic structures right epididymis likely benign cyst.  Chief Complaint  Patient presents with   Benign Prostatic Hypertrophy    HPI: Benjamin Ford is a 62 y.o.male who presents today for a 6 month follow-up.  Previous records reviewed.   I PSS 1/0  He has no urinary complaints.   Patient denies any modifying or aggravating factors.  Patient denies any recent UTI's, gross hematuria, dysuria or suprapubic/flank pain.  Patient denies any fevers, chills, nausea or vomiting.    IPSS     Row Name 08/02/23 1300         International Prostate Symptom Score   How often have you had the sensation of not emptying your bladder? Not at All     How often have you had to urinate less than every two hours? Not at All     How often have you found you stopped and started again several times when you urinated? Not at All     How often have you found it difficult to postpone urination? Not at All     How often have you had a weak urinary stream? Not at All     How often have you had to strain to start urination? Not at All     How many times did you typically get up at night to urinate? 1 Time     Total IPSS Score 1       Quality of Life due to urinary symptoms   If you were to spend the rest of your life with your urinary condition just the way it is now how would you feel about that? Delighted                 Score:  1-7 Mild 8-19 Moderate 20-35 Severe  SHIM 25  Patient still having spontaneous erections.     He denies any pain or curvature with erections.     SHIM     Row Name 08/02/23 1303          SHIM: Over the last 6 months:   How do you rate your confidence that you could get and keep an erection? Very High     When you had erections with sexual stimulation, how often were your erections hard enough for penetration (entering your partner)? Almost Always or Always     During sexual intercourse, how often were you able to maintain your erection after you had penetrated (entered) your partner? Almost Always or Always     During sexual intercourse, how difficult was it to maintain your erection to completion of intercourse? Not Difficult     When you attempted sexual intercourse, how often was it satisfactory for you? Almost Always or Always       SHIM Total Score   SHIM 25              Score: 1-7 Severe ED 8-11 Moderate ED 12-16 Mild-Moderate ED 17-21 Mild ED 22-25 No ED   PMH: Past Medical History:  Diagnosis Date   Anxiety    Asthma    BPH (benign prostatic hyperplasia)  GERD (gastroesophageal reflux disease)    HTN (hypertension)    Insomnia    Palpitations    Stomach ulcer    Testicular mass     Surgical History: Past Surgical History:  Procedure Laterality Date   COLONOSCOPY WITH PROPOFOL N/A 05/27/2020   Procedure: COLONOSCOPY WITH PROPOFOL;  Surgeon: Wyline Mood, MD;  Location[facility] ENDOSCOPY;  Service: Gastroenterology;  Laterality: N/A;   INGUINAL HERNIA REPAIR  2008   MASTECTOMY Bilateral 1992/1981   VASECTOMY  1983    Home Medications:  Allergies as of 08/02/2023   No Known Allergies      Medication List        Accurate as of August 02, 2023  1:17 PM. If you have any questions, ask your nurse or doctor.          albuterol 108 (90 Base) MCG/ACT inhaler Commonly known as: VENTOLIN HFA Inhale 2 puffs into the lungs every 6 (six) hours as needed for wheezing or shortness of breath.   budesonide-formoterol 80-4.5 MCG/ACT inhaler Commonly known as: SYMBICORT Inhale 2 puffs into the lungs 2 (two) times daily.   famotidine 20  MG tablet Commonly known as: Pepcid One after supper   olmesartan 40 MG tablet Commonly known as: BENICAR Take 40 mg by mouth daily. What changed: Another medication with the same name was removed. Continue taking this medication, and follow the directions you see here. Changed by: Carollee Herter Leonilda Cozby   pantoprazole 40 MG tablet Commonly known as: PROTONIX TAKE 1 TABLET(40 MG) BY MOUTH DAILY 30 TO 60 MINUTES BEFORE FIRST MEAL OF THE DAY   rivaroxaban 20 MG Tabs tablet Commonly known as: XARELTO Take 20 mg by mouth daily.   triamcinolone cream 0.1 % Commonly known as: KENALOG Apply 1 application topically 2 (two) times daily.        Allergies:  No Known Allergies  Family History: Family History  Problem Relation Age of Onset   Prostate cancer Father    Cancer Father        bone, prostate   COPD Mother    Cancer Mother        lung   Cancer Maternal Grandmother        brain   Aneurysm Paternal Grandmother    Prostate cancer Brother    Prostate cancer Brother    Kidney disease Neg Hx    Bladder Cancer Neg Hx     Social History:  reports that he quit smoking about 25 years ago. His smoking use included cigarettes. He started smoking about 39 years ago. He has a 14 pack-year smoking history. He has never used smokeless tobacco. He reports current alcohol use. He reports that he does not use drugs.   Physical Exam: Ht 5\' 6"  (1.676 m)   Wt 167 lb (75.8 kg)   BMI 26.95 kg/m   Constitutional:  Well nourished. Alert and oriented, No acute distress. GU: No CVA tenderness.  No bladder fullness or masses.  Patient with circumcised phallus.  Urethral meatus is patent.  No penile discharge. No penile lesions or rashes. Scrotum without lesions, cysts, rashes and/or edema.  Testicles are located scrotally bilaterally. No masses are appreciated in the testicles. Left and right epididymis are normal. Rectal: Patient with  normal sphincter tone. Anus and perineum without scarring or  rashes. No rectal masses are appreciated. Prostate is approximately 45 grams, could not palpate the entire glans, no nodules are appreciated. Seminal vesicles could not be palpated.   Laboratory Data: Comprehensive Metabolic  Panel (CMP) Order: [id number] Component Ref Range & Units 3 d ago  Glucose 70 - 110 mg/dL 409  Sodium 811 - 914 mmol/L 141  Potassium 3.6 - 5.1 mmol/L 4.9  Chloride 97 - 109 mmol/L 106  Carbon Dioxide (CO2) 22.0 - 32.0 mmol/L 27.9  Urea Nitrogen (BUN) 7 - 25 mg/dL 22  Creatinine 0.7 - 1.3 mg/dL 0.8  Glomerular Filtration Rate (eGFR) >60 mL/min/1.73sq m 99  Comment: CKD-EPI (2021) does not include patient's race in the calculation of eGFR.  Monitoring changes of plasma creatinine and eGFR over time is useful for monitoring kidney function.  Interpretive Ranges for eGFR (CKD-EPI 2021):  eGFR:       >60 mL/min/1.73 sq. m - Normal eGFR:       30-59 mL/min/1.73 sq. m - Moderately Decreased eGFR:       15-29 mL/min/1.73 sq. m  - Severely Decreased eGFR:       < 15 mL/min/1.73 sq. m  - Kidney Failure   Note: These eGFR calculations do not apply in acute situations when eGFR is changing rapidly or patients on dialysis.  Calcium 8.7 - 10.3 mg/dL 9.8  AST 8 - 39 U/L 39  ALT 6 - 57 U/L 45  Alk Phos (alkaline Phosphatase) 34 - 104 U/L 40  Albumin 3.5 - 4.8 g/dL 4.6  Bilirubin, Total 0.3 - 1.2 mg/dL 0.4  Protein, Total 6.1 - 7.9 g/dL 6.5  A/G Ratio 1.0 - 5.0 gm/dL 2.4  Resulting Agency [facility]   Specimen Collected: 07/27/23 08:14   Performed by: [facility] Last Resulted: 07/27/23 15:58  Received From: [facility]  Result Received: 07/30/23 15:50   Lipid Panel w/calc LDL Order: [id number] Component Ref Range & Units 3 d ago  Cholesterol, Total 100 - 200 mg/dL 086  Triglyceride 35 - 199 mg/dL 83  HDL (High Density Lipoprotein) Cholesterol 29.0 - 71.0 mg/dL 57.8 High   LDL Calculated 0 - 130  mg/dL 73  VLDL Cholesterol mg/dL 17  Cholesterol/HDL Ratio 2.0  Resulting Agency [facility]   Specimen Collected: 07/27/23 08:14   Performed by: [facility] Last Resulted: 07/27/23 16:17  Received From: [facility]  Result Received: 07/30/23 15:50    Urinalysis w/Microscopic Order: [id number] Component Ref Range & Units 3 d ago  Color Colorless, Straw, Light Yellow, Yellow, Dark Yellow Yellow  Clarity Clear Clear  Specific Gravity 1.005 - 1.030 1.022  pH, Urine 5.0 - 8.0 6.5  Protein, Urinalysis Negative mg/dL Trace Abnormal   Glucose, Urinalysis Negative mg/dL Negative  Ketones, Urinalysis Negative mg/dL Negative  Blood, Urinalysis Negative Negative  Nitrite, Urinalysis Negative Negative  Leukocyte Esterase, Urinalysis Negative Negative  Bilirubin, Urinalysis Negative Negative  Urobilinogen, Urinalysis 0.2 - 1.0 mg/dL 0.2  WBC, UA <=5 /hpf <1  Red Blood Cells, Urinalysis <=3 /hpf 1  Bacteria, Urinalysis 0 - 5 /hpf 0-5  Squamous Epithelial Cells, Urinalysis /hpf 0  Resulting Agency [facility]   Specimen Collected: 07/27/23 08:14   Performed by: [facility] Last Resulted: 07/27/23 12:36  Received From: [facility]  Result Received: 07/30/23 15:50  I have reviewed the labs.   Pertinent Imaging: N/A  Assessment & Plan:    1. BPH with LUTS -PSA pending -DRE benign -continue conservative management, avoiding bladder irritants and timed voiding's    2. Prostate cancer screening -father and brother both diagnosed  with prostate cancer's in their 89's  -his other brother just had a biopsy and it was positive and he has had his prostate removed  Return in about 6 months (around 02/02/2024) for I PSS, SHIM, PSA .  Cloretta Ned   [facility] [address] [place], [place] [address] [phone]

## 2023-08-02 ENCOUNTER — Other Ambulatory Visit
Admission: RE | Admit: 2023-08-02 | Discharge: 2023-08-02 | Disposition: A | Discharge status: Home / Self Care | Payer: 59 | Attending: Urology | Admitting: Urology

## 2023-08-02 ENCOUNTER — Ambulatory Visit: Payer: 59 | Admitting: Urology

## 2023-08-02 ENCOUNTER — Encounter: Payer: Self-pay | Admitting: Urology

## 2023-08-02 VITALS — Ht 66.0 in | Wt 167.0 lb

## 2023-08-02 DIAGNOSIS — N401 Enlarged prostate with lower urinary tract symptoms: Secondary | ICD-10-CM

## 2023-08-02 DIAGNOSIS — Z125 Encounter for screening for malignant neoplasm of prostate: Secondary | ICD-10-CM | POA: Diagnosis not present

## 2023-08-02 DIAGNOSIS — Z8042 Family history of malignant neoplasm of prostate: Secondary | ICD-10-CM

## 2023-08-02 DIAGNOSIS — N138 Other obstructive and reflux uropathy: Secondary | ICD-10-CM

## 2023-08-02 LAB — PSA: Prostatic Specific Antigen: 1.17 ng/mL (ref 0.00–4.00)

## 2023-08-02 NOTE — Addendum Note (Signed)
Addended by: Frankey Shown on: 08/02/2023 01:19 PM   Modules accepted: Orders

## 2023-10-06 ENCOUNTER — Ambulatory Visit: Payer: 59 | Attending: Family Medicine

## 2024-02-06 NOTE — Progress Notes (Unsigned)
 02/07/24 1:12 PM   Benjamin Ford [birth year] [id number]  Referring provider:  Jerl Mina, MD [address] [place],  [place] [address]  Urological history: 1. Prostate cancer screening  -PSA pending  -baseline PSA (2015) 0.6 at age 63 -Brother and father diagnosed in their 62's   2. BPH   3. Epididymal cysts  -scrotal ultrasound 2015 multiple simple appearing cystic structures right epididymis likely benign cyst.  Chief Complaint  Patient presents with   Benign Prostatic Hypertrophy    HPI: Benjamin Ford is a 63 y.o.male who presents today for a 6 month follow-up.  Previous records reviewed.   I PSS 1/0   IPSS     Row Name 02/07/24 1300         International Prostate Symptom Score   How often have you had the sensation of not emptying your bladder? Not at All     How often have you had to urinate less than every two hours? Not at All     How often have you found you stopped and started again several times when you urinated? Not at All     How often have you found it difficult to postpone urination? Not at All     How often have you had a weak urinary stream? Not at All     How often have you had to strain to start urination? Not at All     How many times did you typically get up at night to urinate? 1 Time     Total IPSS Score 1       Quality of Life due to urinary symptoms   If you were to spend the rest of your life with your urinary condition just the way it is now how would you feel about that? Delighted                  Score:  1-7 Mild 8-19 Moderate 20-35 Severe  SHIM 25   SHIM     Row Name 02/07/24 1301         SHIM: Over the last 6 months:   How do you rate your confidence that you could get and keep an erection? Very High     When you had erections with sexual stimulation, how often were your erections hard enough for penetration (entering your partner)? Almost Always or Always     During sexual  intercourse, how often were you able to maintain your erection after you had penetrated (entered) your partner? Almost Always or Always     During sexual intercourse, how difficult was it to maintain your erection to completion of intercourse? Not Difficult     When you attempted sexual intercourse, how often was it satisfactory for you? Almost Always or Always       SHIM Total Score   SHIM 25               Score: 1-7 Severe ED 8-11 Moderate ED 12-16 Mild-Moderate ED 17-21 Mild ED 22-25 No ED   PMH: Past Medical History:  Diagnosis Date   Anxiety    Asthma    BPH (benign prostatic hyperplasia)    GERD (gastroesophageal reflux disease)    HTN (hypertension)    Insomnia    Palpitations    Stomach ulcer    Testicular mass     Surgical History: Past Surgical History:  Procedure Laterality Date   COLONOSCOPY WITH PROPOFOL N/A 05/27/2020  Procedure: COLONOSCOPY WITH PROPOFOL;  Surgeon: Wyline Mood, MD;  Location: [facility] ENDOSCOPY;  Service: Gastroenterology;  Laterality: N/A;   INGUINAL HERNIA REPAIR  2008   MASTECTOMY Bilateral 1992/1981   VASECTOMY  1983    Home Medications:  Allergies as of 02/07/2024   No Known Allergies      Medication List        Accurate as of February 07, 2024  1:12 PM. If you have any questions, ask your nurse or doctor.          STOP taking these medications    famotidine 20 MG tablet Commonly known as: Pepcid   pantoprazole 40 MG tablet Commonly known as: PROTONIX   rivaroxaban 20 MG Tabs tablet Commonly known as: XARELTO       TAKE these medications    albuterol 108 (90 Base) MCG/ACT inhaler Commonly known as: VENTOLIN HFA Inhale 2 puffs into the lungs every 6 (six) hours as needed for wheezing or shortness of breath.   budesonide-formoterol 80-4.5 MCG/ACT inhaler Commonly known as: SYMBICORT Inhale 2 puffs into the lungs 2 (two) times daily.   olmesartan 40 MG tablet Commonly known as: BENICAR Take 40 mg by mouth  daily.   triamcinolone cream 0.1 % Commonly known as: KENALOG Apply 1 application topically 2 (two) times daily.        Allergies:  No Known Allergies  Family History: Family History  Problem Relation Age of Onset   Prostate cancer Father    Cancer Father        bone, prostate   COPD Mother    Cancer Mother        lung   Cancer Maternal Grandmother        brain   Aneurysm Paternal Grandmother    Prostate cancer Brother    Prostate cancer Brother    Kidney disease Neg Hx    Bladder Cancer Neg Hx     Social History:  reports that he quit smoking about 26 years ago. His smoking use included cigarettes. He started smoking about 40 years ago. He has a 14 pack-year smoking history. He has never used smokeless tobacco. He reports current alcohol use. He reports that he does not use drugs.   Physical Exam: BP 136/87 (BP Location: Left Arm, Patient Position: Sitting, Cuff Size: Large)   Pulse (!) 56   Ht 5\' 6"  (1.676 m)   Wt 168 lb (76.2 kg)   SpO2 98%   BMI 27.12 kg/m   Constitutional:  Well nourished. Alert and oriented, No acute distress. HEENT: [place] AT, moist mucus membranes.  Trachea midline Cardiovascular: No clubbing, cyanosis, or edema. Respiratory: Normal respiratory effort, no increased work of breathing. GU: No CVA tenderness.  No bladder fullness or masses.  Patient with circumcised phallus.  Urethral meatus is patent.  No penile discharge. No penile lesions or rashes. Scrotum without lesions, cysts, rashes and/or edema.  Testicles are located scrotally bilaterally. No masses are appreciated in the testicles. Left and right epididymis are normal.  Right spermatocele noted.  Rectal: Patient with  normal sphincter tone. Anus and perineum without scarring or rashes. No rectal masses are appreciated. Prostate is approximately 40 + grams, could not palpate the entire gland, no nodules are appreciated. Seminal vesicles could not be palpated.  Neurologic: Grossly intact, no  focal deficits, moving all 4 extremities. Psychiatric: Normal mood and affect.   Laboratory Data: PSA pending I have reviewed the labs.   Pertinent Imaging: N/A  Assessment & Plan:  1. BPH with LUTS -PSA pending -DRE benign -continue conservative management, avoiding bladder irritants and timed voiding's    2. Prostate cancer screening -father and two brother both diagnosed with prostate cancer's in their 59's   Return in about 6 months (around 08/09/2024) for psa, ipss, shim, dre .  Cloretta Ned   [facility] [address], [address] [place], [place] [address] [phone]

## 2024-02-07 ENCOUNTER — Ambulatory Visit (INDEPENDENT_AMBULATORY_CARE_PROVIDER_SITE_OTHER): Payer: 59 | Admitting: Urology

## 2024-02-07 ENCOUNTER — Other Ambulatory Visit
Admission: RE | Admit: 2024-02-07 | Discharge: 2024-02-07 | Disposition: A | Discharge status: Home / Self Care | Attending: Urology | Admitting: Urology

## 2024-02-07 ENCOUNTER — Encounter: Payer: Self-pay | Admitting: Urology

## 2024-02-07 VITALS — BP 136/87 | HR 56 | Ht 66.0 in | Wt 168.0 lb

## 2024-02-07 DIAGNOSIS — Z8042 Family history of malignant neoplasm of prostate: Secondary | ICD-10-CM

## 2024-02-07 DIAGNOSIS — Z125 Encounter for screening for malignant neoplasm of prostate: Secondary | ICD-10-CM | POA: Insufficient documentation

## 2024-02-07 DIAGNOSIS — N401 Enlarged prostate with lower urinary tract symptoms: Secondary | ICD-10-CM

## 2024-02-07 DIAGNOSIS — N138 Other obstructive and reflux uropathy: Secondary | ICD-10-CM | POA: Insufficient documentation

## 2024-02-07 LAB — PSA: Prostatic Specific Antigen: 1.11 ng/mL (ref 0.00–4.00)

## 2024-06-20 ENCOUNTER — Ambulatory Visit
Admission: RE | Admit: 2024-06-20 | Discharge: 2024-06-20 | Disposition: A | Discharge status: Home / Self Care | Source: Ambulatory Visit | Attending: Family Medicine | Admitting: Family Medicine

## 2024-06-20 ENCOUNTER — Observation Stay
Admission: EM | Admit: 2024-06-20 | Discharge: 2024-06-22 | Disposition: A | Discharge status: Home / Self Care | Attending: Internal Medicine | Admitting: Internal Medicine

## 2024-06-20 ENCOUNTER — Other Ambulatory Visit: Payer: Self-pay | Admitting: Family Medicine

## 2024-06-20 ENCOUNTER — Observation Stay

## 2024-06-20 ENCOUNTER — Other Ambulatory Visit: Payer: Self-pay

## 2024-06-20 DIAGNOSIS — N400 Enlarged prostate without lower urinary tract symptoms: Secondary | ICD-10-CM | POA: Diagnosis not present

## 2024-06-20 DIAGNOSIS — I82402 Acute embolism and thrombosis of unspecified deep veins of left lower extremity: Secondary | ICD-10-CM | POA: Insufficient documentation

## 2024-06-20 DIAGNOSIS — Z9013 Acquired absence of bilateral breasts and nipples: Secondary | ICD-10-CM | POA: Diagnosis not present

## 2024-06-20 DIAGNOSIS — Z825 Family history of asthma and other chronic lower respiratory diseases: Secondary | ICD-10-CM

## 2024-06-20 DIAGNOSIS — Z801 Family history of malignant neoplasm of trachea, bronchus and lung: Secondary | ICD-10-CM

## 2024-06-20 DIAGNOSIS — Z8042 Family history of malignant neoplasm of prostate: Secondary | ICD-10-CM

## 2024-06-20 DIAGNOSIS — I824Z2 Acute embolism and thrombosis of unspecified deep veins of left distal lower extremity: Secondary | ICD-10-CM | POA: Diagnosis present

## 2024-06-20 DIAGNOSIS — Z8616 Personal history of COVID-19: Secondary | ICD-10-CM | POA: Diagnosis not present

## 2024-06-20 DIAGNOSIS — Z79899 Other long term (current) drug therapy: Secondary | ICD-10-CM

## 2024-06-20 DIAGNOSIS — Z87891 Personal history of nicotine dependence: Secondary | ICD-10-CM

## 2024-06-20 DIAGNOSIS — I824Y1 Acute embolism and thrombosis of unspecified deep veins of right proximal lower extremity: Secondary | ICD-10-CM

## 2024-06-20 DIAGNOSIS — Z8711 Personal history of peptic ulcer disease: Secondary | ICD-10-CM

## 2024-06-20 DIAGNOSIS — M79604 Pain in right leg: Secondary | ICD-10-CM

## 2024-06-20 DIAGNOSIS — K219 Gastro-esophageal reflux disease without esophagitis: Secondary | ICD-10-CM | POA: Diagnosis not present

## 2024-06-20 DIAGNOSIS — I10 Essential (primary) hypertension: Secondary | ICD-10-CM | POA: Diagnosis not present

## 2024-06-20 DIAGNOSIS — R600 Localized edema: Secondary | ICD-10-CM | POA: Insufficient documentation

## 2024-06-20 DIAGNOSIS — I82432 Acute embolism and thrombosis of left popliteal vein: Secondary | ICD-10-CM | POA: Diagnosis present

## 2024-06-20 DIAGNOSIS — Z122 Encounter for screening for malignant neoplasm of respiratory organs: Secondary | ICD-10-CM | POA: Diagnosis not present

## 2024-06-20 DIAGNOSIS — R0902 Hypoxemia: Secondary | ICD-10-CM | POA: Diagnosis present

## 2024-06-20 DIAGNOSIS — J45909 Unspecified asthma, uncomplicated: Secondary | ICD-10-CM | POA: Diagnosis present

## 2024-06-20 DIAGNOSIS — L237 Allergic contact dermatitis due to plants, except food: Secondary | ICD-10-CM | POA: Diagnosis present

## 2024-06-20 DIAGNOSIS — F419 Anxiety disorder, unspecified: Secondary | ICD-10-CM | POA: Diagnosis not present

## 2024-06-20 DIAGNOSIS — Z6826 Body mass index (BMI) 26.0-26.9, adult: Secondary | ICD-10-CM | POA: Diagnosis not present

## 2024-06-20 DIAGNOSIS — I82401 Acute embolism and thrombosis of unspecified deep veins of right lower extremity: Secondary | ICD-10-CM | POA: Diagnosis not present

## 2024-06-20 DIAGNOSIS — I2609 Other pulmonary embolism with acute cor pulmonale: Secondary | ICD-10-CM | POA: Diagnosis not present

## 2024-06-20 DIAGNOSIS — I2699 Other pulmonary embolism without acute cor pulmonale: Secondary | ICD-10-CM | POA: Diagnosis present

## 2024-06-20 DIAGNOSIS — Z808 Family history of malignant neoplasm of other organs or systems: Secondary | ICD-10-CM

## 2024-06-20 DIAGNOSIS — Z7901 Long term (current) use of anticoagulants: Secondary | ICD-10-CM

## 2024-06-20 DIAGNOSIS — I82412 Acute embolism and thrombosis of left femoral vein: Secondary | ICD-10-CM | POA: Diagnosis not present

## 2024-06-20 DIAGNOSIS — E663 Overweight: Secondary | ICD-10-CM | POA: Diagnosis present

## 2024-06-20 LAB — COMPREHENSIVE METABOLIC PANEL WITH GFR
ALT: 29 U/L (ref 0–44)
AST: 22 U/L (ref 15–41)
Albumin: 4 g/dL (ref 3.5–5.0)
Alkaline Phosphatase: 38 U/L (ref 38–126)
Anion gap: 9 (ref 5–15)
BUN: 16 mg/dL (ref 8–23)
CO2: 28 mmol/L (ref 22–32)
Calcium: 9.5 mg/dL (ref 8.9–10.3)
Chloride: 102 mmol/L (ref 98–111)
Creatinine, Ser: 0.79 mg/dL (ref 0.61–1.24)
GFR, Estimated: >60 mL/min (ref >60)
Glucose, Bld: 114 mg/dL — ABNORMAL HIGH (ref 70–99)
Potassium: 4.1 mmol/L (ref 3.5–5.1)
Sodium: 139 mmol/L (ref 135–145)
Total Bilirubin: 1.2 mg/dL (ref 0.0–1.2)
Total Protein: 6.4 g/dL — ABNORMAL LOW (ref 6.5–8.1)

## 2024-06-20 LAB — CBC
HCT: 43.2 % (ref 39.0–52.0)
Hemoglobin: 14.6 g/dL (ref 13.0–17.0)
MCH: 32.9 pg (ref 26.0–34.0)
MCHC: 33.8 g/dL (ref 30.0–36.0)
MCV: 97.3 fL (ref 80.0–100.0)
Platelets: 182 K/uL (ref 150–400)
RBC: 4.44 MIL/uL (ref 4.22–5.81)
RDW: 13.7 % (ref 11.5–15.5)
WBC: 9.6 K/uL (ref 4.0–10.5)
nRBC: 0 % (ref 0.0–0.2)

## 2024-06-20 LAB — HIV ANTIBODY (ROUTINE TESTING W REFLEX): HIV Screen 4th Generation wRfx: NONREACTIVE

## 2024-06-20 LAB — PROTIME-INR
INR: 1 (ref 0.8–1.2)
Prothrombin Time: 14.1 s (ref 11.4–15.2)

## 2024-06-20 LAB — HEPARIN LEVEL (UNFRACTIONATED): Heparin Unfractionated: 0.56 [IU]/mL (ref 0.30–0.70)

## 2024-06-20 LAB — APTT: aPTT: 26 s (ref 24–36)

## 2024-06-20 MED ORDER — ONDANSETRON HCL 4 MG/2ML IJ SOLN: 4.0000 mg | Freq: Four times a day (QID) | INTRAMUSCULAR | Status: DC | PRN

## 2024-06-20 MED ORDER — IRBESARTAN 150 MG PO TABS
75.0000 mg | ORAL_TABLET | Freq: Every day | ORAL | Status: DC
  Administered 2024-06-21: 75 mg via ORAL
  Filled 2024-06-20 (×2): qty 1

## 2024-06-20 MED ORDER — ALBUTEROL SULFATE (2.5 MG/3ML) 0.083% IN NEBU: 2.5000 mg | INHALATION_SOLUTION | Freq: Four times a day (QID) | RESPIRATORY_TRACT | Status: DC | PRN

## 2024-06-20 MED ORDER — IOHEXOL 350 MG/ML SOLN
75.0000 mL | Freq: Once | INTRAVENOUS | Status: AC | PRN
  Administered 2024-06-20: 75 mL via INTRAVENOUS

## 2024-06-20 MED ORDER — ACETAMINOPHEN 325 MG PO TABS: 650.0000 mg | ORAL_TABLET | Freq: Four times a day (QID) | ORAL | Status: DC | PRN

## 2024-06-20 MED ORDER — OXYCODONE HCL 5 MG PO TABS: 5.0000 mg | ORAL_TABLET | ORAL | Status: DC | PRN

## 2024-06-20 MED ORDER — FLUTICASONE FUROATE-VILANTEROL 100-25 MCG/ACT IN AEPB
1.0000 | INHALATION_SPRAY | Freq: Every day | RESPIRATORY_TRACT | Status: DC
  Administered 2024-06-21: 1 via RESPIRATORY_TRACT
  Filled 2024-06-20 (×2): qty 28

## 2024-06-20 MED ORDER — ONDANSETRON HCL 4 MG PO TABS: 4.0000 mg | ORAL_TABLET | Freq: Four times a day (QID) | ORAL | Status: DC | PRN

## 2024-06-20 MED ORDER — ACETAMINOPHEN 650 MG RE SUPP: 650.0000 mg | Freq: Four times a day (QID) | RECTAL | Status: DC | PRN

## 2024-06-20 MED ORDER — MORPHINE SULFATE (PF) 2 MG/ML IV SOLN: 2.0000 mg | INTRAVENOUS | Status: DC | PRN

## 2024-06-20 MED ORDER — HYDRALAZINE HCL 25 MG PO TABS: 25.0000 mg | ORAL_TABLET | Freq: Four times a day (QID) | ORAL | Status: DC | PRN

## 2024-06-20 MED ORDER — HEPARIN BOLUS VIA INFUSION
5000.0000 [IU] | Freq: Once | INTRAVENOUS | Status: AC
  Administered 2024-06-20: 5000 [IU] via INTRAVENOUS
  Filled 2024-06-20: qty 5000

## 2024-06-20 MED ORDER — TRIAMCINOLONE ACETONIDE 0.1 % EX CREA
1.0000 | TOPICAL_CREAM | Freq: Two times a day (BID) | CUTANEOUS | Status: DC
  Filled 2024-06-20 (×3): qty 15

## 2024-06-20 MED ORDER — HEPARIN (PORCINE) 25000 UT/250ML-% IV SOLN
1250.0000 [IU]/h | INTRAVENOUS | Status: DC
  Administered 2024-06-20: 1250 [IU]/h via INTRAVENOUS
  Filled 2024-06-20 (×2): qty 250

## 2024-06-20 NOTE — ED Notes (Signed)
 Pt in CT at this time.

## 2024-06-20 NOTE — H&P (Signed)
 History and Physical    Patient: Benjamin Ford FMW:[id number] DOB: Aug 08, 1961 DOA: 06/20/2024 DOS: the patient was seen and examined on 06/20/2024 PCP: Valora Agent, MD  Patient coming from: Home  Chief Complaint: No chief complaint on file.  HPI: Benjamin Ford is a 63 y.o. male with medical history significant of hypertension, asthma, GERD, BPH, anxiety who presented to the ED today for evaluation of right leg pain that started on Friday.  Patient initially thought it was a charley horse but due to progressive pain, he contacted his doctor who suggested he come in for evaluation.  He reports pain improves at rest with legs elevated and worsens with upright positioning and ambulation.  He denies any shortness of breath or chest pain.  He does report prior history of right lower extremity DVT about a year ago in the setting of being in a boot for plantar fasciitis and taking a flight to [place]  around that time as well.  He denies any known family history of blood clots.  He denies any recent surgeries, prolonged immobility or travel.  He is a non-smoker.  Patient reports otherwise recently in good health with no fevers chills, nausea vomiting abdominal pain diarrhea, cough congestion or sore throat.  No other recent illnesses or symptoms.  In the ED, initial vitals temp 98.3 F, heart rate 87, respirations 20, BP 175/98, SPO2 100% on room air.  Labs were obtained including CMP and CBC which were notable only for nonfasting glucose 114, total protein 6.4.  CBC was normal.  INR 1.0.  Right lower extremity Doppler ultrasound showed occlusive thrombus in the right calf veins extending into the popliteal vein proximally to the level of the distal right common femoral vein.    Patient was seen in the ED by vascular surgery who recommended observation admission and further evaluation including CTA chest to rule out PE.   Review of Systems: As mentioned in the history of present illness. All other  systems reviewed and are negative. Past Medical History:  Diagnosis Date   Anxiety    Asthma    BPH (benign prostatic hyperplasia)    GERD (gastroesophageal reflux disease)    HTN (hypertension)    Insomnia    Palpitations    Stomach ulcer    Testicular mass    Past Surgical History:  Procedure Laterality Date   COLONOSCOPY WITH PROPOFOL  N/A 05/27/2020   Procedure: COLONOSCOPY WITH PROPOFOL ;  Surgeon: Therisa Bi, MD;  Location: [facility] ENDOSCOPY;  Service: Gastroenterology;  Laterality: N/A;   INGUINAL HERNIA REPAIR  2008   MASTECTOMY Bilateral 1992/1981   VASECTOMY  1983   Social History:  reports that he quit smoking about 26 years ago. His smoking use included cigarettes. He started smoking about 40 years ago. He has a 14 pack-year smoking history. He has never used smokeless tobacco. He reports current alcohol use. He reports that he does not use drugs.  No Known Allergies  Family History  Problem Relation Age of Onset   Prostate cancer Father    Cancer Father        bone, prostate   COPD Mother    Cancer Mother        lung   Cancer Maternal Grandmother        brain   Aneurysm Paternal Grandmother    Prostate cancer Brother    Prostate cancer Brother    Kidney disease Neg Hx    Bladder Cancer Neg Hx     Prior to  Admission medications   Medication Sig Start Date End Date Taking? Authorizing Provider  albuterol  (VENTOLIN  HFA) 108 (90 Base) MCG/ACT inhaler Inhale 2 puffs into the lungs every 6 (six) hours as needed for wheezing or shortness of breath. 11/28/21   Emilio Oneal Biglow DASEN, FNP  olmesartan (BENICAR) 40 MG tablet Take 40 mg by mouth daily. 11/17/22   [provider]  triamcinolone  cream (KENALOG ) 0.1 % Apply 1 application topically 2 (two) times daily. 08/24/19   Ashok Kathrine HERO, PA-C    Physical Exam: Vitals:   06/20/24 1223 06/20/24 1415 06/20/24 1641  BP: (!) 175/98  (!) 171/97  Pulse: 87  84  Resp: 20  18  Temp: 98.3 F (36.8 C)  98.4 F (36.9  C)  TempSrc: Oral  Oral  SpO2: 100% 100% 100%  Weight: 74.8 kg    Height: 5' 6 (1.676 m)     General exam: awake, alert, no acute distress HEENT: atraumatic, clear conjunctiva, anicteric sclera, moist mucus membranes, hearing grossly normal  Respiratory system: CTAB, no wheezes, rales or rhonchi, normal respiratory effort. Cardiovascular system: normal S1/S2, RRR, no JVD, murmurs, rubs, gallops,  no pedal edema.   Gastrointestinal system: soft, NT, ND, no HSM felt, +bowel sounds. Central nervous system: A&O x 4. no gross focal neurologic deficits, normal speech Extremities: Tenderness on palpation of the right posterior calf, no significant swelling or erythema noted  Skin: dry, intact, normal temperature, normal color, No rashes, lesions or ulcers Psychiatry: normal mood, congruent affect, judgement and insight appear normal  Data Reviewed:  As reviewed in detail above  Assessment and Plan:  Acute DVT of right lower extremity -- Continue IV heparin  -- Vascular surgery consulted and will reevaluate in the morning -- Diet this evening, n.p.o. after midnight -- Follow-up pending CTA chest -- Pain control as needed -- Transition to Eliquis  at discharge -- Recommend outpatient hematology referral for hypercoagulable evaluation  History of hypertension -BP elevated 175/98 on admission -- Resume home Benicar (formulary equivalent) -- Oral hydralazine  as needed  History of asthma -diagnosed post COVID-19 infection.  Stable, not exacerbated -- As needed albuterol  nebs --Breo substitute for home Symbicort   Recent poison ivy  -- Patient reports completing prednisone  -- Continue topical Kenalog      Advance Care Planning: CODE STATUS-full code  Consults: Vascular surgery  Family Communication: None  Severity of Illness: The appropriate patient status for this patient is OBSERVATION. Observation status is judged to be reasonable and necessary in order to provide the required  intensity of service to ensure the patient's safety. The patient's presenting symptoms, physical exam findings, and initial radiographic and laboratory data in the context of their medical condition is felt to place them at decreased risk for further clinical deterioration. Furthermore, it is anticipated that the patient will be medically stable for discharge from the hospital within 2 midnights of admission.   Author: Burnard DELENA Cunning, DO 06/20/2024 4:51 PM  For on call review [email or web address].

## 2024-06-20 NOTE — ED Triage Notes (Signed)
 Pt comes with 5 days right leg pain. Pt states he had scan done and positive for DVT. Pt states he was sent here for evaluation.

## 2024-06-20 NOTE — ED Notes (Signed)
 Pt back from CT

## 2024-06-20 NOTE — Consult Note (Signed)
 Hospital Consult    Reason for Consult:  Right Lower Extremity Occlusive DVT  Requesting Physician:  Dr Ronal Lewandowsky MD  MRN #:  [id number]  History of Present Illness: This is a 63 y.o. 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 He presents to [facility] emergency department due to increased pain and swelling to his right lower extremity. He states the symptoms started last Friday evening that got progressively worse over the past two days.  He denies any recent trauma or long travel via plane, train or automobiles. He endorses elevation and rest help and he is able to walk but with 10/10 pain. His wife is at the bedside today and endorses he has had covid 5 times and has been vaccinated twice. Since that time he has had difficulties doing any exercise. He was a runner doing [employer] and can barely exercise at all since his initial DVT a year ago. Vascular surgery was consulted to evaluate.    Past Medical History:  Diagnosis Date   Anxiety    Asthma    BPH (benign prostatic hyperplasia)    GERD (gastroesophageal reflux disease)    HTN (hypertension)    Insomnia    Palpitations    Stomach ulcer    Testicular mass     Past Surgical History:  Procedure Laterality Date   COLONOSCOPY WITH PROPOFOL  N/A 05/27/2020   Procedure: COLONOSCOPY WITH PROPOFOL ;  Surgeon: Therisa Bi, MD;  Location: [facility] ENDOSCOPY;  Service: Gastroenterology;  Laterality: N/A;   INGUINAL HERNIA REPAIR  2008   MASTECTOMY Bilateral 1992/1981   VASECTOMY  1983    No Known Allergies  Prior to Admission medications   Medication Sig Start Date End Date Taking? Authorizing Provider  albuterol  (VENTOLIN  HFA) 108 (90 Base) MCG/ACT inhaler Inhale 2 puffs into the lungs every 6 (six) hours as needed for wheezing or shortness of breath. 11/28/21   Emilio Kelly DASEN, FNP  olmesartan (BENICAR) 40 MG tablet Take 40 mg by mouth daily. 11/17/22    [provider]  triamcinolone  cream (KENALOG ) 0.1 % Apply 1 application topically 2 (two) times daily. 08/24/19   Ashok Kathrine HERO, PA-C    Social History   Socioeconomic History   Marital status: Married    Spouse name: Not on file   Number of children: Not on file   Years of education: Not on file   Highest education level: Not on file  Occupational History   Not on file  Tobacco Use   Smoking status: Former    Current packs/day: 0.00    Average packs/day: 1 pack/day for 14.0 years (14.0 ttl pk-yrs)    Types: Cigarettes    Start date: 45    Quit date: 1999    Years since quitting: 26.5   Smokeless tobacco: Never   Tobacco comments:    smoked <1  ppd off and on from mid 20s to mid 10s.   Vaping Use   Vaping status: Never Used  Substance and Sexual Activity   Alcohol use: Yes    Alcohol/week: 0.0 standard drinks of alcohol    Comment: moderate   Drug use: No   Sexual activity: Not on file  Other Topics Concern   Not on file  Social History Narrative   Not on file   Social Drivers of Health   Financial Resource Strain: Patient Declined (08/03/2023)   Received from  [facility]   Overall Financial Resource Strain (CARDIA)    Difficulty of Paying Living Expenses: Patient declined  Food Insecurity: Patient Declined (08/03/2023)   Received from [facility]   Hunger Vital Sign    Within the past 12 months, you worried that your food would run out before you got the money to buy more.: Patient declined    Within the past 12 months, the food you bought just didn't last and you didn't have money to get more.: Patient declined  Transportation Needs: Patient Declined (08/03/2023)   Received from [facility] - Transportation    In the past 12 months, has lack of transportation kept you from medical appointments or from getting medications?: Patient declined    Lack of Transportation (Non-Medical):  Patient declined  Physical Activity: Not on file  Stress: Not on file  Social Connections: Not on file  Intimate Partner Violence: Not on file      Family History  Problem Relation Age of Onset   Prostate cancer Father    Cancer Father        bone, prostate   COPD Mother    Cancer Mother        lung   Cancer Maternal Grandmother        brain   Aneurysm Paternal Grandmother    Prostate cancer Brother    Prostate cancer Brother    Kidney disease Neg Hx    Bladder Cancer Neg Hx     ROS: Otherwise negative unless mentioned in HPI  Physical Examination  Vitals:   06/20/24 1223 06/20/24 1415  BP: (!) 175/98   Pulse: 87   Resp: 20   Temp: 98.3 F (36.8 C)   SpO2: 100% 100%   Body mass index is 26.63 kg/m.  General:  WDWN in NAD Gait: Not observed HENT: WNL, normocephalic Pulmonary: normal non-labored breathing, without Rales, rhonchi,  wheezing Cardiac: regular, without  Murmurs, rubs or gallops; without carotid bruits Abdomen: Positive Bowel sounds throughout, soft, NT/ND, no masses Skin: without rashes Vascular Exam/Pulses: Palpable Pulses throughout, right lower extremity with +1 edema  Extremities: without ischemic changes, without Gangrene , without cellulitis; without open wounds;  Musculoskeletal: no muscle wasting or atrophy  Neurologic: A&O X 3;  No focal weakness or paresthesias are detected; speech is fluent/normal Psychiatric:  The pt has Normal affect. Lymph:  Unremarkable  CBC    Component Value Date/Time   WBC 9.6 06/20/2024 1442   RBC 4.44 06/20/2024 1442   HGB 14.6 06/20/2024 1442   HGB 15.0 03/19/2021 0914   HCT 43.2 06/20/2024 1442   HCT 44.5 03/19/2021 0914   PLT 182 06/20/2024 1442   PLT 238 03/19/2021 0914   MCV 97.3 06/20/2024 1442   MCV 96 03/19/2021 0914   MCH 32.9 06/20/2024 1442   MCHC 33.8 06/20/2024 1442   RDW 13.7 06/20/2024 1442   RDW 12.3 03/19/2021 0914   LYMPHSABS 2.1 12/30/2021 1731   LYMPHSABS 1.8 03/19/2021 0914    MONOABS 0.7 12/30/2021 1731   EOSABS 0.5 12/30/2021 1731   EOSABS 0.4 03/19/2021 0914   BASOSABS 0.1 12/30/2021 1731   BASOSABS 0.1 03/19/2021 0914    BMET    Component Value Date/Time   NA 139 06/20/2024 1442   NA 143 03/19/2021 0914   K 4.1 06/20/2024 1442   CL 102 06/20/2024 1442   CO2 28 06/20/2024 1442   GLUCOSE 114 (H) 06/20/2024 1442   BUN  16 06/20/2024 1442   BUN 17 03/19/2021 0914   CREATININE 0.79 06/20/2024 1442   CALCIUM  9.5 06/20/2024 1442   GFRNONAA >60 06/20/2024 1442   GFRAA 106 05/28/2020 1549    COAGS: No results found for: INR, PROTIME   Non-Invasive Vascular Imaging:   CTA of Chest Ordered but not completed at this time   EXAM:06/20/2024 RIGHT LOWER EXTREMITY VENOUS DOPPLER ULTRASOUND   TECHNIQUE: Gray-scale sonography with compression, as well as color and duplex ultrasound, were performed to evaluate the deep venous system(s) from the level of the common femoral vein through the popliteal and proximal calf veins.   COMPARISON:  None Available.   FINDINGS: VENOUS   Occlusive thrombus within the right calf veins extending into the popliteal vein proximally to the level of the distal right common femoral vein. The great saphenous vein and profundus femoris are normally compressible and patent.   Limited views of the contralateral common femoral vein are unremarkable.   OTHER   None.   Limitations: none   IMPRESSION: Occlusive thrombus within the right calf veins extending into the popliteal vein proximally to the level of the distal right common femoral vein.   These results will be called to the ordering clinician or representative by the Radiology Department at the imaging location.  Statin:  No. Beta Blocker:  No. Aspirin:  No. ACEI:  No. ARB:  Yes.   CCB use:  No Other antiplatelets/anticoagulants:  No.    ASSESSMENT/PLAN: This is a 63 y.o. male who presents to [facility] Emergency department for increased pain and  swelling to his right lower extremity. He has had a prior DVT to his right leg. He was placed on blood thinners for about 6 months and in follow up had ultrasounds showing the DVT had resolved and therefore taken off anticoagulation. He presents today feeling the same as prior but this time the pain is worse feeling like a bad charlie horse to his thigh not just his calf.   Upon work up he underwent an ultrasound of his lower extremities which reveal an occlusive clot In his right tibial veins extending to the distal part of his right common femoral vein.   Vascular Surgery recommends starting the patient on a Heparin  infusion here in the ED, make NPO for possible right lower extremity thrombectomy tomorrow pending how his pain and swelling are after being on heparin  overnight. I also recommend CTA of the chest with PE protocol due to his inability to exercise or run anymore. My thought is he may have a chronic pulmonary embolism from the prior DVT that has never resolved. 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  Both the patient and his wife agree and would like to understand why he can no longer exercise.    -I discussed the case in detail with Dr Cordella Shawl and he agrees with the plan.   Gwendlyn JONELLE Shank [facility] 06/20/2024 3:54 PM

## 2024-06-20 NOTE — ED Provider Notes (Signed)
 [facility] Emergency Department Provider Note     Event Date/Time   First MD Initiated Contact with Patient 06/20/24 1440     (approximate)   History   No chief complaint on file.   HPI  Benjamin Ford is a 63 y.o. male with a history of HTN, asthma, GERD and BPH presents to the ED for evaluation of right leg pain ongoing for 5 days.  Patient had a lower extremity venous duplex ultrasound performed earlier today which resulted in a positive occlusive DVT.  His primary care provider referred him to the ED for further evaluation.  Patient states he has had a DVT in the past around the same time last year.  He denies recent travel, surgery, chest pain or shortness of breath  Chart reviewed.  Ultrasound performed showing occlusive DVT of the right leg.    Physical Exam   Triage Vital Signs: ED Triage Vitals  Encounter Vitals Group     BP 06/20/24 1223 (!) 175/98     Girls Systolic BP Percentile --      Girls Diastolic BP Percentile --      Boys Systolic BP Percentile --      Boys Diastolic BP Percentile --      Pulse Rate 06/20/24 1223 87     Resp 06/20/24 1223 20     Temp 06/20/24 1223 98.3 F (36.8 C)     Temp Source 06/20/24 1223 Oral     SpO2 06/20/24 1223 100 %     Weight 06/20/24 1223 165 lb (74.8 kg)     Height 06/20/24 1223 5' 6 (1.676 m)     Head Circumference --      Peak Flow --      Pain Score 06/20/24 1225 7     Pain Loc --      Pain Education --      Exclude from Growth Chart --     Most recent vital signs: Vitals:   06/20/24 1415 06/20/24 1641  BP:  (!) 171/97  Pulse:  84  Resp:  18  Temp:  98.4 F (36.9 C)  SpO2: 100% 100%    General Awake, no distress.  HEENT NCAT.  CV:  Good peripheral perfusion.  RESP:  Normal effort.  ABD:  No distention.  Other:  Mild swelling to the posterior right knee.  No redness or warmth visualized.  Moderate tenderness to palpation to posterior knee.  Severe tenderness with knee  extension.  Neurovascular status intact all throughout.    ED Results / Procedures / Treatments   Labs (all labs ordered are listed, but only abnormal results are displayed) Labs Reviewed  COMPREHENSIVE METABOLIC PANEL WITH GFR - Abnormal; Notable for the following components:      Result Value   Glucose, Bld 114 (*)    Total Protein 6.4 (*)    All other components within normal limits  CBC  APTT  PROTIME-INR  HIV ANTIBODY (ROUTINE TESTING W REFLEX)  CBC  HEPARIN  LEVEL (UNFRACTIONATED)    RADIOLOGY  I personally viewed and evaluated these images as part of my medical decision making, as well as reviewing the written report by the radiologist.  ED Provider Interpretation: Occlusive thrombus with right calf veins extending to the popliteal vein proximally to the level of the distal right common femoral vein  US  Venous Img Lower Unilateral Right (DVT) Result Date: 06/20/2024 CLINICAL DATA:  History of right calf DVT 1 year ago EXAM:  RIGHT LOWER EXTREMITY VENOUS DOPPLER ULTRASOUND TECHNIQUE: Gray-scale sonography with compression, as well as color and duplex ultrasound, were performed to evaluate the deep venous system(s) from the level of the common femoral vein through the popliteal and proximal calf veins. COMPARISON:  None Available. FINDINGS: VENOUS Occlusive thrombus within the right calf veins extending into the popliteal vein proximally to the level of the distal right common femoral vein. The great saphenous vein and profundus femoris are normally compressible and patent. Limited views of the contralateral common femoral vein are unremarkable. OTHER None. Limitations: none IMPRESSION: Occlusive thrombus within the right calf veins extending into the popliteal vein proximally to the level of the distal right common femoral vein. These results will be called to the ordering clinician or representative by the Radiology Department at the imaging location. Electronically Signed   By:  Limin  Xu M.D.   On: 06/20/2024 11:42    PROCEDURES:  Critical Care performed: No  Procedures   MEDICATIONS ORDERED IN ED: Medications  heparin  ADULT infusion 100 units/mL (25000 units/250mL) (1,250 Units/hr Intravenous New Bag/Given 06/20/24 1655)  acetaminophen  (TYLENOL ) tablet 650 mg (has no administration in time range)    Or  acetaminophen  (TYLENOL ) suppository 650 mg (has no administration in time range)  oxyCODONE  (Oxy IR/ROXICODONE ) immediate release tablet 5 mg (has no administration in time range)  morphine  (PF) 2 MG/ML injection 2 mg (has no administration in time range)  ondansetron  (ZOFRAN ) tablet 4 mg (has no administration in time range)    Or  ondansetron  (ZOFRAN ) injection 4 mg (has no administration in time range)  albuterol  (PROVENTIL ) (2.5 MG/3ML) 0.083% nebulizer solution 2.5 mg (has no administration in time range)  irbesartan  (AVAPRO ) tablet 75 mg (has no administration in time range)  hydrALAZINE  (APRESOLINE ) tablet 25 mg (has no administration in time range)  fluticasone  furoate-vilanterol (BREO ELLIPTA ) 100-25 MCG/ACT 1 puff (has no administration in time range)  triamcinolone  cream (KENALOG ) 0.1 % cream 1 Application (has no administration in time range)  heparin  bolus via infusion 5,000 Units (5,000 Units Intravenous Bolus from Bag 06/20/24 1655)  iohexol  (OMNIPAQUE ) 350 MG/ML injection 75 mL (75 mLs Intravenous Contrast Given 06/20/24 1717)     IMPRESSION / MDM / ASSESSMENT AND PLAN / ED COURSE  I reviewed the triage vital signs and the nursing notes.                              Clinical Course as of 06/20/24 1750  Tue Jun 20, 2024  1444 IMPRESSION:  Occlusive thrombus within the right calf veins extending into the  popliteal vein proximally to the level of the distal right common  femoral vein.   These results will be called to the ordering clinician or  representative by the Radiology Department at the imaging location.    Electronically  Signed    By: Limin  Xu M.D.    On: 06/20/2024 11:42   [MH]  1632 Discussed with Gwendlyn Shank, NP with vascular who assessed patient at bedside. Recommendations to further workup with CT angio Chest to r/o PE. Start IV heparin  and admit for observation. Will reevaluate in the AM  [MH]    Clinical Course User Index [MH] Margrette Monte A, PA-C    63 y.o. male presents to the emergency department for evaluation and treatment of DVT. See HPI for further details.   Patient's presentation is most consistent with acute complicated illness / injury requiring diagnostic workup.  Please see clinical course.  On initial assessment patient is well-appearing.  He is hemodynamically stable and alert and oriented.  Physical exam findings are stated above.  Consulted with vascular who assessed patient at bedside.  Patient will be admitted to the hospital for further evaluation under vascular team who will reassess in the AM.  Patient stable at transfer.  FINAL CLINICAL IMPRESSION(S) / ED DIAGNOSES   Final diagnoses:  Acute deep vein thrombosis (DVT) of right lower extremity, unspecified vein (HCC)    Rx / DC Orders   ED Discharge Orders     None        Note:  This document was prepared using Dragon voice recognition software and may include unintentional dictation errors.    Margrette Monte A, PA-C 06/20/24 1750    Clarine Ozell LABOR, MD 06/21/24 2351

## 2024-06-20 NOTE — Consult Note (Signed)
 PHARMACY - ANTICOAGULATION CONSULT NOTE  Pharmacy Consult for Heparin  Indication: DVT  No Known Allergies  Patient Measurements: Height: 5' 6 (167.6 cm) Weight: 74.8 kg (165 lb) IBW/kg (Calculated) : 63.8 HEPARIN  DW (KG): 74.8  Vital Signs: Temp: 98.7 F (37.1 C) (07/15 2100) Temp Source: Oral (07/15 2100) BP: 159/89 (07/15 2100) Pulse Rate: 86 (07/15 2100)  Labs: Recent Labs    06/20/24 1442 06/20/24 1443 06/20/24 2307  HGB 14.6  --   --   HCT 43.2  --   --   PLT 182  --   --   APTT  --  26  --   LABPROT  --  14.1  --   INR  --  1.0  --   HEPARINUNFRC  --   --  0.56  CREATININE 0.79  --   --     Estimated Creatinine Clearance: 84.2 mL/min (by C-G formula based on SCr of 0.79 mg/dL).   Medical History: Past Medical History:  Diagnosis Date   Anxiety    Asthma    BPH (benign prostatic hyperplasia)    GERD (gastroesophageal reflux disease)    HTN (hypertension)    Insomnia    Palpitations    Stomach ulcer    Testicular mass     Pertinent Medications:  No history of chronic anticoagulation PTA  Assessment: 63yo male presenting to the ED with 5 days of right leg pain. Ultrasound imaging of right lower extremity positive for occlusive thrombus within the right calf veins extending into the popliteal vein proximally to the level of the distal right common femoral vein. Pharmacy has been consulted to initiate and monitor heparin  infusion.  Baseline labs: aPTT 26sec, INR 1.0, Hgb 14.6, Plts 182   Goal of Therapy:  Heparin  level 0.3-0.7 units/ml Monitor platelets by anticoagulation protocol: Yes   Plan:  7/15: HL @ 2307 = 0.56, therapeutic X 1 - Will continue this pt on current rate and recheck HL in 6 hrs Continue to monitor H&H and platelets  Madelyne Millikan D 06/20/2024,11:53 PM

## 2024-06-20 NOTE — Consult Note (Signed)
 PHARMACY - ANTICOAGULATION CONSULT NOTE  Pharmacy Consult for Heparin  Indication: DVT  No Known Allergies  Patient Measurements: Height: 5' 6 (167.6 cm) Weight: 74.8 kg (165 lb) IBW/kg (Calculated) : 63.8 HEPARIN  DW (KG): 74.8  Vital Signs: Temp: 98.3 F (36.8 C) (07/15 1223) Temp Source: Oral (07/15 1223) BP: 175/98 (07/15 1223) Pulse Rate: 87 (07/15 1223)  Labs: Recent Labs    06/20/24 1442  HGB 14.6  HCT 43.2  PLT 182  CREATININE 0.79    Estimated Creatinine Clearance: 84.2 mL/min (by C-G formula based on SCr of 0.79 mg/dL).   Medical History: Past Medical History:  Diagnosis Date   Anxiety    Asthma    BPH (benign prostatic hyperplasia)    GERD (gastroesophageal reflux disease)    HTN (hypertension)    Insomnia    Palpitations    Stomach ulcer    Testicular mass     Pertinent Medications:  No history of chronic anticoagulation PTA  Assessment: 64yo male presenting to the ED with 5 days of right leg pain. Ultrasound imaging of right lower extremity positive for occlusive thrombus within the right calf veins extending into the popliteal vein proximally to the level of the distal right common femoral vein. Pharmacy has been consulted to initiate and monitor heparin  infusion.  Baseline labs: aPTT 26sec, INR 1.0, Hgb 14.6, Plts 182   Goal of Therapy:  Heparin  level 0.3-0.7 units/ml Monitor platelets by anticoagulation protocol: Yes   Plan:  Give 5000 units bolus x 1 Start heparin  infusion at 1250 units/hr Check anti-Xa level in 6 hours and daily while on heparin  Continue to monitor H&H and platelets  Annalena Piatt A Azael Ragain 06/20/2024,4:34 PM

## 2024-06-21 ENCOUNTER — Encounter
Admission: EM | Disposition: A | Discharge status: Home / Self Care | Payer: Self-pay | Source: Home / Self Care | Attending: Emergency Medicine

## 2024-06-21 ENCOUNTER — Telehealth (HOSPITAL_COMMUNITY): Payer: Self-pay | Admitting: Pharmacy Technician

## 2024-06-21 ENCOUNTER — Other Ambulatory Visit (HOSPITAL_COMMUNITY): Payer: Self-pay

## 2024-06-21 DIAGNOSIS — I824Y1 Acute embolism and thrombosis of unspecified deep veins of right proximal lower extremity: Secondary | ICD-10-CM | POA: Diagnosis not present

## 2024-06-21 DIAGNOSIS — I2609 Other pulmonary embolism with acute cor pulmonale: Secondary | ICD-10-CM | POA: Diagnosis not present

## 2024-06-21 DIAGNOSIS — I50810 Right heart failure, unspecified: Secondary | ICD-10-CM | POA: Diagnosis not present

## 2024-06-21 DIAGNOSIS — I10 Essential (primary) hypertension: Secondary | ICD-10-CM

## 2024-06-21 DIAGNOSIS — E663 Overweight: Secondary | ICD-10-CM | POA: Insufficient documentation

## 2024-06-21 DIAGNOSIS — I2699 Other pulmonary embolism without acute cor pulmonale: Secondary | ICD-10-CM | POA: Diagnosis not present

## 2024-06-21 HISTORY — PX: PULMONARY THROMBECTOMY: CATH118295

## 2024-06-21 LAB — CBC
HCT: 41.1 % (ref 39.0–52.0)
Hemoglobin: 13.9 g/dL (ref 13.0–17.0)
MCH: 32.4 pg (ref 26.0–34.0)
MCHC: 33.8 g/dL (ref 30.0–36.0)
MCV: 95.8 fL (ref 80.0–100.0)
Platelets: 181 K/uL (ref 150–400)
RBC: 4.29 MIL/uL (ref 4.22–5.81)
RDW: 13.6 % (ref 11.5–15.5)
WBC: 8.4 K/uL (ref 4.0–10.5)
nRBC: 0 % (ref 0.0–0.2)

## 2024-06-21 LAB — HEPARIN LEVEL (UNFRACTIONATED): Heparin Unfractionated: 0.59 [IU]/mL (ref 0.30–0.70)

## 2024-06-21 SURGERY — PULMONARY THROMBECTOMY
Anesthesia: Moderate Sedation

## 2024-06-21 MED ORDER — IODIXANOL 320 MG/ML IV SOLN
INTRAVENOUS | Status: DC | PRN
  Administered 2024-06-21: 40 mL

## 2024-06-21 MED ORDER — CEFAZOLIN SODIUM-DEXTROSE 2-4 GM/100ML-% IV SOLN
INTRAVENOUS | Status: AC
  Filled 2024-06-21: qty 100

## 2024-06-21 MED ORDER — MIDAZOLAM HCL 2 MG/2ML IJ SOLN
INTRAMUSCULAR | Status: AC
  Filled 2024-06-21: qty 2

## 2024-06-21 MED ORDER — FAMOTIDINE 20 MG PO TABS: 40.0000 mg | ORAL_TABLET | Freq: Once | ORAL | Status: DC | PRN

## 2024-06-21 MED ORDER — LABETALOL HCL 5 MG/ML IV SOLN
INTRAVENOUS | Status: DC | PRN
  Administered 2024-06-21: 10 mg via INTRAVENOUS

## 2024-06-21 MED ORDER — LABETALOL HCL 5 MG/ML IV SOLN
INTRAVENOUS | Status: AC
  Filled 2024-06-21: qty 4

## 2024-06-21 MED ORDER — FENTANYL CITRATE PF 50 MCG/ML IJ SOSY
PREFILLED_SYRINGE | INTRAMUSCULAR | Status: AC
  Filled 2024-06-21: qty 1

## 2024-06-21 MED ORDER — HEPARIN SODIUM (PORCINE) 1000 UNIT/ML IJ SOLN
INTRAMUSCULAR | Status: AC
  Filled 2024-06-21: qty 10

## 2024-06-21 MED ORDER — HEPARIN (PORCINE) 25000 UT/250ML-% IV SOLN
1250.0000 [IU]/h | INTRAVENOUS | Status: DC
  Administered 2024-06-21 – 2024-06-22 (×2): 1250 [IU]/h via INTRAVENOUS
  Filled 2024-06-21: qty 250

## 2024-06-21 MED ORDER — CEFAZOLIN SODIUM-DEXTROSE 2-4 GM/100ML-% IV SOLN
2.0000 g | INTRAVENOUS | Status: AC
Start: 2024-06-21 — End: 2024-06-21
  Administered 2024-06-21: 2 g via INTRAVENOUS

## 2024-06-21 MED ORDER — DIPHENHYDRAMINE HCL 50 MG/ML IJ SOLN: 50.0000 mg | Freq: Once | INTRAMUSCULAR | Status: DC | PRN

## 2024-06-21 MED ORDER — HEPARIN (PORCINE) IN NACL 1000-0.9 UT/500ML-% IV SOLN
INTRAVENOUS | Status: DC | PRN
  Administered 2024-06-21: 1000 mL

## 2024-06-21 MED ORDER — FENTANYL CITRATE (PF) 100 MCG/2ML IJ SOLN
INTRAMUSCULAR | Status: DC | PRN
  Administered 2024-06-21 (×3): 50 ug via INTRAVENOUS

## 2024-06-21 MED ORDER — FENTANYL CITRATE (PF) 100 MCG/2ML IJ SOLN
INTRAMUSCULAR | Status: AC
  Filled 2024-06-21: qty 2

## 2024-06-21 MED ORDER — ALTEPLASE 1 MG/ML SYRINGE FOR VASCULAR PROCEDURE
INTRAMUSCULAR | Status: DC | PRN
  Administered 2024-06-21: 8 mg via INTRA_ARTERIAL

## 2024-06-21 MED ORDER — MIDAZOLAM HCL 2 MG/ML PO SYRP
8.0000 mg | ORAL_SOLUTION | Freq: Once | ORAL | Status: DC | PRN
  Filled 2024-06-21: qty 5

## 2024-06-21 MED ORDER — MIDAZOLAM HCL 2 MG/2ML IJ SOLN
INTRAMUSCULAR | Status: DC | PRN
  Administered 2024-06-21: 2 mg via INTRAVENOUS
  Administered 2024-06-21 (×2): 1 mg via INTRAVENOUS

## 2024-06-21 MED ORDER — LIDOCAINE HCL (PF) 1 % IJ SOLN
INTRAMUSCULAR | Status: DC | PRN
  Administered 2024-06-21: 10 mL

## 2024-06-21 MED ORDER — METHYLPREDNISOLONE SODIUM SUCC 125 MG IJ SOLR: 125.0000 mg | Freq: Once | INTRAMUSCULAR | Status: DC | PRN

## 2024-06-21 MED ORDER — SODIUM CHLORIDE 0.9 % IV SOLN
INTRAVENOUS | Status: DC
Start: 2024-06-21 — End: 2024-06-21

## 2024-06-21 SURGICAL SUPPLY — 21 items
CANISTER PENUMBRA ENGINE (MISCELLANEOUS) IMPLANT
CATH ANGIO 5F PIGTAIL 100CM (CATHETERS) IMPLANT
CATH INDIGO SEP 7 (CATHETERS) IMPLANT
CATH LIGHTNING 8 XTORQ 115 (CATHETERS) IMPLANT
CATH SELECT H1 TIP 5F 130 (CATHETERS) IMPLANT
CLOSURE PERCLOSE PROSTYLE (VASCULAR PRODUCTS) IMPLANT
COVER PROBE ULTRASOUND 5X96 (MISCELLANEOUS) IMPLANT
DEVICE TORQUE (MISCELLANEOUS) IMPLANT
GLIDEWIRE ANGLED SS 035X260CM (WIRE) IMPLANT
NDL ENTRY 21GA 7CM ECHOTIP (NEEDLE) IMPLANT
NEEDLE ENTRY 21GA 7CM ECHOTIP (NEEDLE) ×1 IMPLANT
PACK ANGIOGRAPHY (CUSTOM PROCEDURE TRAY) ×1 IMPLANT
SET INTRO CAPELLA COAXIAL (SET/KITS/TRAYS/PACK) IMPLANT
SHEATH 9FRX11 (SHEATH) IMPLANT
SHEATH BRITE TIP 6FRX11 (SHEATH) IMPLANT
SUT MNCRL AB 4-0 PS2 18 (SUTURE) IMPLANT
SUT SILK 0 FSL (SUTURE) IMPLANT
SYR MEDRAD MARK 7 150ML (SYRINGE) IMPLANT
TUBING CONTRAST HIGH PRESS 72 (TUBING) IMPLANT
WIRE AMPLATZ SSTIFF .035X260CM (WIRE) IMPLANT
WIRE J 3MM .035X145CM (WIRE) IMPLANT

## 2024-06-21 NOTE — Assessment & Plan Note (Signed)
BMI 26.63 with current height and weight in computer. 

## 2024-06-21 NOTE — Telephone Encounter (Signed)
 Patient Product/process development scientist completed.    The patient is insured through [facility]. Patient has [employer], may use a copay card, and/or apply for patient assistance if available.    Ran test claim for Eliquis  5 mg and the current 30 day co-pay is $245.00 due to a $200.00 deductible.  Will be $45.00 once deductible is met.  Ran test claim for Xarelto  20 mg and the current 30 day co-pay is $245.00 due to a $200.00 deductible.  Will be $45.00 once deductible is met.  This test claim was processed through [facility]- copay amounts may vary at other pharmacies due to pharmacy/plan contracts, or as the patient moves through the different stages of their insurance plan.     Benjamin Ford, CPHT Pharmacy Technician III Certified Patient Advocate [facility] Patient Advocate Team Direct Number: [phone]  Fax: [phone]

## 2024-06-21 NOTE — Consult Note (Signed)
 PHARMACY - ANTICOAGULATION CONSULT NOTE  Pharmacy Consult for Heparin  Indication: DVT  No Known Allergies  Patient Measurements: Height: 5' 6 (167.6 cm) Weight: 74.8 kg (165 lb) IBW/kg (Calculated) : 63.8 HEPARIN  DW (KG): 74.8  Vital Signs: Temp: 98.3 F (36.8 C) (07/16 0958) Temp Source: Oral (07/16 0958) BP: 160/85 (07/16 0958) Pulse Rate: 61 (07/16 0958)  Labs: Recent Labs    06/20/24 1442 06/20/24 1443 06/20/24 2307 06/21/24 0630  HGB 14.6  --   --  13.9  HCT 43.2  --   --  41.1  PLT 182  --   --  181  APTT  --  26  --   --   LABPROT  --  14.1  --   --   INR  --  1.0  --   --   HEPARINUNFRC  --   --  0.56 0.59  CREATININE 0.79  --   --   --     Estimated Creatinine Clearance: 84.2 mL/min (by C-G formula based on SCr of 0.79 mg/dL).   Medical History: Past Medical History:  Diagnosis Date   Anxiety    Asthma    BPH (benign prostatic hyperplasia)    GERD (gastroesophageal reflux disease)    HTN (hypertension)    Insomnia    Palpitations    Stomach ulcer    Testicular mass     Pertinent Medications:  No history of chronic anticoagulation PTA  Assessment: 63yo male presenting to the ED with 5 days of right leg pain. Ultrasound imaging of right lower extremity positive for occlusive thrombus within the right calf veins extending into the popliteal vein proximally to the level of the distal right common femoral vein. Pharmacy has been consulted to initiate and monitor heparin  infusion.  Baseline labs: aPTT 26sec, INR 1.0, Hgb 14.6, Plts 182  Goal of Therapy:  Heparin  level 0.3-0.7 units/ml Monitor platelets by anticoagulation protocol: Yes   Plan:  7/15: HL @ 2307 = 0.56, therapeutic X 1 7/15: HL @ 0630 = 0.59, therapeutic X 2 Will continue this pt on current rate and recheck HL in 6 hrs Continue to monitor H&H and platelets  Will M. Lenon, PharmD Clinical Pharmacist 06/21/2024 10:02 AM

## 2024-06-21 NOTE — Assessment & Plan Note (Signed)
 Bilateral submassive Pulmonary embolism on ct scan.  Status post thrombolysis and mechanical thrombectomy on 7/16 by Dr. Jama.  On 7/17 heparin  drip switched over to Eliquis  and patient was doing well and sent home.

## 2024-06-21 NOTE — Consult Note (Signed)
 PHARMACY - ANTICOAGULATION CONSULT NOTE  Pharmacy Consult for Heparin  Indication: DVT  No Known Allergies  Patient Measurements: Height: 5' 6 (167.6 cm) Weight: 74.8 kg (165 lb) IBW/kg (Calculated) : 63.8 HEPARIN  DW (KG): 74.8  Vital Signs: Temp: 97.9 F (36.6 C) (07/16 1916) Temp Source: Oral (07/16 1916) BP: 166/62 (07/16 1916) Pulse Rate: 61 (07/16 1916)  Labs: Recent Labs    06/20/24 1442 06/20/24 1443 06/20/24 2307 06/21/24 0630  HGB 14.6  --   --  13.9  HCT 43.2  --   --  41.1  PLT 182  --   --  181  APTT  --  26  --   --   LABPROT  --  14.1  --   --   INR  --  1.0  --   --   HEPARINUNFRC  --   --  0.56 0.59  CREATININE 0.79  --   --   --     Estimated Creatinine Clearance: 84.2 mL/min (by C-G formula based on SCr of 0.79 mg/dL).   Medical History: Past Medical History:  Diagnosis Date   Anxiety    Asthma    BPH (benign prostatic hyperplasia)    GERD (gastroesophageal reflux disease)    HTN (hypertension)    Insomnia    Palpitations    Stomach ulcer    Testicular mass     Pertinent Medications:  No history of chronic anticoagulation PTA  Assessment: 63yo male presenting to the ED with 5 days of right leg pain. Ultrasound imaging of right lower extremity positive for occlusive thrombus within the right calf veins extending into the popliteal vein proximally to the level of the distal right common femoral vein. Pharmacy has been consulted to initiate and monitor heparin  infusion.  Baseline labs: aPTT 26sec, INR 1.0, Hgb 14.6, Plts 182  Goal of Therapy:  Heparin  level 0.3-0.7 units/ml Monitor platelets by anticoagulation protocol: Yes   Plan:  7/15: HL @ 2307 = 0.56, therapeutic X 1 7/15: HL @ 0630 = 0.59, therapeutic X 2 7/16: heparin  drip stopped for vasc procedure@1737 , to be resumed @1945  with no bolus, ok to follow nomogram thereafter Will resume this pt at 1250units/hr (previous rate) and check HL in 6 hrs Continue to monitor H&H and  platelets  Allean Haas PharmD Clinical Pharmacist 06/21/2024

## 2024-06-21 NOTE — Progress Notes (Signed)
   06/21/24 1700  Spiritual Encounters  Type of Visit Initial  Care provided to: Pt not available;Family (Family in Cath Lab Waiting Room; Family was very grateful for Elia Harvey in the ED)  Referral source Chaplain assessment  Reason for visit Surgical  OnCall Visit Yes  Spiritual Framework  Presenting Themes Goals in life/care;Values and beliefs;Impactful experiences and emotions;Courage hope and growth;Other (comment) (For Family)  Interventions  Spiritual Care Interventions Made Compassionate presence;Reflective listening;Explored values/beliefs/practices/strengths;Encouragement;Other (comment) (For Family)  Intervention Outcomes  Outcomes Connection to spiritual care;Awareness around self/spiritual resourses;Connection to values and goals of care;Awareness of health;Awareness of support;Reduced isolation;Other (comment) (For Family)

## 2024-06-21 NOTE — Hospital Course (Signed)
 63 y.o. male with medical history significant of hypertension, asthma, GERD, BPH, anxiety who presented to the ED today for evaluation of right leg pain that started on Friday.  Patient initially thought it was a charley horse but due to progressive pain, he contacted his doctor who suggested he come in for evaluation.  He reports pain improves at rest with legs elevated and worsens with upright positioning and ambulation.  He denies any shortness of breath or chest pain.  He does report prior history of right lower extremity DVT about a year ago in the setting of being in a boot for plantar fasciitis and taking a flight to [place]  around that time as well.  He denies any known family history of blood clots.  He denies any recent surgeries, prolonged immobility or travel.  He is a non-smoker.  Patient reports otherwise recently in good health with no fevers chills, nausea vomiting abdominal pain diarrhea, cough congestion or sore throat.  No other recent illnesses or symptoms.   In the ED, initial vitals temp 98.3 F, heart rate 87, respirations 20, BP 175/98, SPO2 100% on room air.  Labs were obtained including CMP and CBC which were notable only for nonfasting glucose 114, total protein 6.4.  CBC was normal.  INR 1.0.   Right lower extremity Doppler ultrasound showed occlusive thrombus in the right calf veins extending into the popliteal vein proximally to the level of the distal right common femoral vein.     Patient was seen in the ED by vascular surgery who recommended observation admission and further evaluation including CTA chest to rule out PE.  7/16.  Patient with submassive bilateral PE on CT scan.  Thrombolysis of bilateral pulmonary arteries with tPA and mechanical thrombectomy of bilateral lobar pulmonary arteries. 7/17.  Patient feeling much improved.  Converted from heparin  drip over to Eliquis  and discharged home today.

## 2024-06-21 NOTE — Progress Notes (Signed)
   06/21/24 1330  Spiritual Encounters  Type of Visit Initial  Care provided to: Pt and family  Referral source Chaplain assessment  Reason for visit Routine spiritual support  Spiritual Framework  Presenting Themes Significant life change (Pt stated he was active until he had Covid 5 times and now can't even throw football with her grandson)  Interventions  Spiritual Care Interventions Made Established relationship of care and support;Compassionate presence;Reflective listening;Prayer  Intervention Outcomes  Outcomes Connection to spiritual care;Awareness around self/spiritual resourses;Reduced anxiety;Connected to spiritual community  Spiritual Care Plan  Spiritual Care Issues Still Outstanding No further spiritual care needs at this time (see row info)   Pt is in hallway waiting for a procedure and chaplain stopped to see if there was anything she could do for them. His wife, Stephane, and a good friend are at the bedside. Chaplain asked if she could pray and pt said, We would love that. Chaplain prayed and let them know that chaplain services are available 24/7 should they need us .

## 2024-06-21 NOTE — Interval H&P Note (Signed)
 History and Physical Interval Note:  06/21/2024 5:42 PM  Benjamin Ford  has presented today for surgery, with the diagnosis of Pulmonary embolism.  The various methods of treatment have been discussed with the patient and family. After consideration of risks, benefits and other options for treatment, the patient has consented to  Procedure(s): PULMONARY THROMBECTOMY (N/A) as a surgical intervention.  The patient's history has been reviewed, patient examined, no change in status, stable for surgery.  I have reviewed the patient's chart and labs.  Questions were answered to the patient's satisfaction.     Cordella Shawl

## 2024-06-21 NOTE — H&P (View-Only) (Signed)
 Progress Note    06/21/2024 9:57 AM * No surgery found *  Subjective:  Benjamin Ford is a 63 yo male who presented to [facility] emergency department yesterday with right lower extremity swelling and pain.  He underwent bilateral lower extremity ultrasounds which revealed an occlusion of the distal right femoral common vein through the popliteal vein into his tibial veins.  He was placed on heparin  overnight.  He endorses had DVTs to his right lower extremity in the past.  In a long detailed conversation yesterday his wife endorsed that he is very active and athletic.  He was running 5K marathons prior to his DVT a year ago.  Since that time he has been unable to exercise or run.  She feels his shortness of breath is significant on exertion.  I felt compelled to asked the ER physician to order a CTA of the chest with PE protocol based on her description of his symptoms currently.  Unfortunately for the patient he was found to have an acute pulmonary embolism in bilateral lungs right greater than left.  He is also noted to have right sided heart strain at 1.4.  This morning on exam he is in the hallway in bed in the emergency department.  He states his leg feels much better and is a lot less swollen and tight this morning after being on heparin  all night.  In terms of his breathing, nonlabored as long as he is resting but still feels short of breath on any exertion.   Vitals:   06/21/24 0538 06/21/24 0654  BP: (!) 155/83   Pulse: 69   Resp: 16   Temp:  98.4 F (36.9 C)  SpO2: 98%    Physical Exam: Cardiac:  RRR, Normal S1, S2. No murmurs appreciated. Lungs:  Non labored breathing while at rest. Clear on auscultation. More diminished on the right verses the left.  Incisions:  None Extremities:  All extremities are warm to the touch with palpable pulses. Right lower extremity is less painful and without swelling this am.  Abdomen:  Positive bowel sounds throughout, soft non tender and non  distended.  Neurologic: AAOX3, answerers all questions and follows commands appropriately.   CBC    Component Value Date/Time   WBC 8.4 06/21/2024 0630   RBC 4.29 06/21/2024 0630   HGB 13.9 06/21/2024 0630   HGB 15.0 03/19/2021 0914   HCT 41.1 06/21/2024 0630   HCT 44.5 03/19/2021 0914   PLT 181 06/21/2024 0630   PLT 238 03/19/2021 0914   MCV 95.8 06/21/2024 0630   MCV 96 03/19/2021 0914   MCH 32.4 06/21/2024 0630   MCHC 33.8 06/21/2024 0630   RDW 13.6 06/21/2024 0630   RDW 12.3 03/19/2021 0914   LYMPHSABS 2.1 12/30/2021 1731   LYMPHSABS 1.8 03/19/2021 0914   MONOABS 0.7 12/30/2021 1731   EOSABS 0.5 12/30/2021 1731   EOSABS 0.4 03/19/2021 0914   BASOSABS 0.1 12/30/2021 1731   BASOSABS 0.1 03/19/2021 0914    BMET    Component Value Date/Time   NA 139 06/20/2024 1442   NA 143 03/19/2021 0914   K 4.1 06/20/2024 1442   CL 102 06/20/2024 1442   CO2 28 06/20/2024 1442   GLUCOSE 114 (H) 06/20/2024 1442   BUN 16 06/20/2024 1442   BUN 17 03/19/2021 0914   CREATININE 0.79 06/20/2024 1442   CALCIUM 9.5 06/20/2024 1442   GFRNONAA >60 06/20/2024 1442   GFRAA 106 05/28/2020 1549    INR  Component Value Date/Time   INR 1.0 06/20/2024 1443    No intake or output data in the 24 hours ending 06/21/24 0957   Assessment/Plan:  63 y.o. male who presents to [facility] ER with painful and swollen right lower extremity.  * No surgery found *   PLAN I had a long detailed discussion this morning with both the patient and his wife at the bedside in the emergency department concerning the CTA of his chest with PE protocol findings.  Both were told he has considerable size pulmonary embolisms right greater than left with right heart strain.  I recommend that he undergo pulmonary thrombectomy later today vs anticoagulation only due to the increase in right heart strain.  Vascular surgery plans on taking the patient to the vascular lab for a pulmonary thrombectomy later today on 06/21/2024.   I discussed in detail at the bedside with the patient and his wife today the procedure, benefits, risk, and complications.  Both verbalized her understanding and wished to proceed.  I answered all their questions today.  I addressed that we would not be doing a procedure on his right lower extremity today or in the near future due to decrease in swelling and pain he has had being on heparin  overnight.  Patient has been n.p.o. since midnight last night.  Patient remains on heparin  infusion at this time.  Patient's last BUN from yesterday was 16 and his creatinine was 0.79.   DVT prophylaxis: Heparin  infusion   Gwendlyn JONELLE Shank [facility] 06/21/2024 9:57 AM

## 2024-06-21 NOTE — Progress Notes (Signed)
 Progress Note    06/21/2024 9:57 AM * No surgery found *  Subjective:  Benjamin Ford is a 63 yo male who presented to [facility] emergency department yesterday with right lower extremity swelling and pain.  He underwent bilateral lower extremity ultrasounds which revealed an occlusion of the distal right femoral common vein through the popliteal vein into his tibial veins.  He was placed on heparin  overnight.  He endorses had DVTs to his right lower extremity in the past.  In a long detailed conversation yesterday his wife endorsed that he is very active and athletic.  He was running 5K marathons prior to his DVT a year ago.  Since that time he has been unable to exercise or run.  She feels his shortness of breath is significant on exertion.  I felt compelled to asked the ER physician to order a CTA of the chest with PE protocol based on her description of his symptoms currently.  Unfortunately for the patient he was found to have an acute pulmonary embolism in bilateral lungs right greater than left.  He is also noted to have right sided heart strain at 1.4.  This morning on exam he is in the hallway in bed in the emergency department.  He states his leg feels much better and is a lot less swollen and tight this morning after being on heparin  all night.  In terms of his breathing, nonlabored as long as he is resting but still feels short of breath on any exertion.   Vitals:   06/21/24 0538 06/21/24 0654  BP: (!) 155/83   Pulse: 69   Resp: 16   Temp:  98.4 F (36.9 C)  SpO2: 98%    Physical Exam: Cardiac:  RRR, Normal S1, S2. No murmurs appreciated. Lungs:  Non labored breathing while at rest. Clear on auscultation. More diminished on the right verses the left.  Incisions:  None Extremities:  All extremities are warm to the touch with palpable pulses. Right lower extremity is less painful and without swelling this am.  Abdomen:  Positive bowel sounds throughout, soft non tender and non  distended.  Neurologic: AAOX3, answerers all questions and follows commands appropriately.   CBC    Component Value Date/Time   WBC 8.4 06/21/2024 0630   RBC 4.29 06/21/2024 0630   HGB 13.9 06/21/2024 0630   HGB 15.0 03/19/2021 0914   HCT 41.1 06/21/2024 0630   HCT 44.5 03/19/2021 0914   PLT 181 06/21/2024 0630   PLT 238 03/19/2021 0914   MCV 95.8 06/21/2024 0630   MCV 96 03/19/2021 0914   MCH 32.4 06/21/2024 0630   MCHC 33.8 06/21/2024 0630   RDW 13.6 06/21/2024 0630   RDW 12.3 03/19/2021 0914   LYMPHSABS 2.1 12/30/2021 1731   LYMPHSABS 1.8 03/19/2021 0914   MONOABS 0.7 12/30/2021 1731   EOSABS 0.5 12/30/2021 1731   EOSABS 0.4 03/19/2021 0914   BASOSABS 0.1 12/30/2021 1731   BASOSABS 0.1 03/19/2021 0914    BMET    Component Value Date/Time   NA 139 06/20/2024 1442   NA 143 03/19/2021 0914   K 4.1 06/20/2024 1442   CL 102 06/20/2024 1442   CO2 28 06/20/2024 1442   GLUCOSE 114 (H) 06/20/2024 1442   BUN 16 06/20/2024 1442   BUN 17 03/19/2021 0914   CREATININE 0.79 06/20/2024 1442   CALCIUM 9.5 06/20/2024 1442   GFRNONAA >60 06/20/2024 1442   GFRAA 106 05/28/2020 1549    INR  Component Value Date/Time   INR 1.0 06/20/2024 1443    No intake or output data in the 24 hours ending 06/21/24 0957   Assessment/Plan:  63 y.o. male who presents to [facility] ER with painful and swollen right lower extremity.  * No surgery found *   PLAN I had a long detailed discussion this morning with both the patient and his wife at the bedside in the emergency department concerning the CTA of his chest with PE protocol findings.  Both were told he has considerable size pulmonary embolisms right greater than left with right heart strain.  I recommend that he undergo pulmonary thrombectomy later today vs anticoagulation only due to the increase in right heart strain.  Vascular surgery plans on taking the patient to the vascular lab for a pulmonary thrombectomy later today on 06/21/2024.   I discussed in detail at the bedside with the patient and his wife today the procedure, benefits, risk, and complications.  Both verbalized her understanding and wished to proceed.  I answered all their questions today.  I addressed that we would not be doing a procedure on his right lower extremity today or in the near future due to decrease in swelling and pain he has had being on heparin  overnight.  Patient has been n.p.o. since midnight last night.  Patient remains on heparin  infusion at this time.  Patient's last BUN from yesterday was 16 and his creatinine was 0.79.   DVT prophylaxis: Heparin  infusion   Benjamin Ford [facility] 06/21/2024 9:57 AM

## 2024-06-21 NOTE — Assessment & Plan Note (Signed)
 On irbesartan.

## 2024-06-21 NOTE — Progress Notes (Signed)
 Progress Note   Patient: Benjamin Ford FMW:[id number] DOB: May 09, 1961 DOA: 06/20/2024     0 DOS: the patient was seen and examined on 06/21/2024   Brief hospital course:  63 y.o. male with medical history significant of hypertension, asthma, GERD, BPH, anxiety who presented to the ED today for evaluation of right leg pain that started on Friday.  Patient initially thought it was a charley horse but due to progressive pain, he contacted his doctor who suggested he come in for evaluation.  He reports pain improves at rest with legs elevated and worsens with upright positioning and ambulation.  He denies any shortness of breath or chest pain.  He does report prior history of right lower extremity DVT about a year ago in the setting of being in a boot for plantar fasciitis and taking a flight to [place]  around that time as well.  He denies any known family history of blood clots.  He denies any recent surgeries, prolonged immobility or travel.  He is a non-smoker.  Patient reports otherwise recently in good health with no fevers chills, nausea vomiting abdominal pain diarrhea, cough congestion or sore throat.  No other recent illnesses or symptoms.   In the ED, initial vitals temp 98.3 F, heart rate 87, respirations 20, BP 175/98, SPO2 100% on room air.  Labs were obtained including CMP and CBC which were notable only for nonfasting glucose 114, total protein 6.4.  CBC was normal.  INR 1.0.   Right lower extremity Doppler ultrasound showed occlusive thrombus in the right calf veins extending into the popliteal vein proximally to the level of the distal right common femoral vein.     Patient was seen in the ED by vascular surgery who recommended observation admission and further evaluation including CTA chest to rule out PE.  7/16.  Patient with submassive bilateral PE on CT scan.  For thrombectomy today.  Assessment and Plan: * Pulmonary embolism (HCC) Bilateral submassive Pulmonary embolism on ct  scan.  On heparin  drip and for thrombectomy today.    Acute deep vein thrombosis (DVT) of right lower extremity (HCC) No pain once started on blood thinner.  Essential hypertension On irbesartan   Overweight (BMI 25.0-29.9) BMI 26.63 with current height and weight in computer.        Subjective: Patient feeling better this am.  No pain in right leg.  Always has some shortness of breath  Physical Exam: Vitals:   06/21/24 0101 06/21/24 0538 06/21/24 0654 06/21/24 0958  BP: (!) 105/58 (!) 155/83  (!) 160/85  Pulse: 73 69  61  Resp: 16 16  11   Temp:   98.4 F (36.9 C) 98.3 F (36.8 C)  TempSrc:   Oral Oral  SpO2: 98% 98%  95%  Weight:      Height:       Physical Exam HENT:     Head: Normocephalic.  Eyes:     General: Lids are normal.     Conjunctiva/sclera: Conjunctivae normal.  Cardiovascular:     Rate and Rhythm: Normal rate and regular rhythm.     Heart sounds: Normal heart sounds, S1 normal and S2 normal.  Pulmonary:     Breath sounds: No decreased breath sounds, wheezing, rhonchi or rales.  Abdominal:     Palpations: Abdomen is soft.     Tenderness: There is no abdominal tenderness.  Musculoskeletal:     Right lower leg: No swelling.     Left lower leg: No swelling.  Skin:  General: Skin is warm.     Findings: No rash.  Neurological:     Mental Status: He is alert and oriented to person, place, and time.     Data Reviewed: Cbc normal, cr 0.79  Family Communication: declined  Disposition: Status is: Inpatient Remains inpatient appropriate because: for thrombectomy today  Planned Discharge Destination: Home    Time spent: 28 minutes  Author: Charlie Patterson, MD 06/21/2024 2:58 PM  For on call review [email or web address].

## 2024-06-21 NOTE — Assessment & Plan Note (Signed)
 No pain once started on blood thinner.

## 2024-06-21 NOTE — Op Note (Signed)
 [facility]  Percutaneous Study/Intervention Procedural Note   Date of Surgery: 06/21/2024,7:14 PM  Surgeon:Ellora Varnum, Benjamin MATSU   Pre-operative Diagnosis: Symptomatic pulmonary emboli with right heart strain and hypoxia  Post-operative diagnosis:  Same  Procedure(s) Performed:  1.  Contrast injection right heart and bilateral pulmonary arteries  2.  Thrombolysis bilateral pulmonary arteries with 8 mg of TPA  3.  Mechanical thrombectomy bilateral lobar pulmonary arteries for removal of pulmonary emboli using the Penumbra CAT 8 thrombectomy catheter.  4.  Selective catheter placement right upper lobe pulmonary artery, middle lobe pulmonary artery and lower lobe pulmonary artery  5.  Selective catheter placement left upper lobe pulmonary artery and lower lobe pulmonary artery    Anesthesia: Conscious sedation was administered under my direct supervision by the interventional radiology RN. IV Versed  plus fentanyl  were utilized. Continuous ECG, pulse oximetry and blood pressure was monitored throughout the entire procedure.  Versed  and fentanyl  were administered intravenously.  Conscious sedation was administered for a total of 72 minutes.  Sheath: 9 French 11 cm Pinnacle sheath antegrade right common femoral vein  Contrast: 40 cc   Fluoroscopy Time: 16.0 minutes  Indications:  Patient presents with pulmonary emboli. The patient is symptomatic with hypoxemia and dyspnea on exertion.  There is evidence of right heart strain on the CT angiogram. The patient is otherwise a good candidate for intervention and even the long-term benefits pulmonary angiography with thrombolysis is offered. The risks and benefits are reviewed long-term benefits are discussed. All questions are answered patient agrees to proceed.  Procedure:  Benjamin Ford a 63 y.o. male who was identified and appropriate procedural time out was performed.  The patient was then placed supine on the table  and prepped and draped in the usual sterile fashion.  Ultrasound was used to evaluate the right common femoral vein.  It was patent, as it was echolucent and compressible.  A digital ultrasound image was acquired for the permanent record.  A micropuncture needle was used to access the right common femoral vein under direct ultrasound guidance.  A microwire was then advanced under fluoroscopic guidance followed by micro-sheath.  A 0.035 J wire was advanced without resistance and a 5Fr sheath was placed.  Perclose devices were then used in a preclose fashion and then upsized to an 9 [place] sheath.    The wire and pigtail catheter were then negotiated into the right atrium and bolus injection of contrast was utilized to demonstrate the right ventricle and the pulmonary artery outflow.   TPA was reconstituted and delivered onto the table. A total of 8 milligrams of TPA was utilized.  0 mg was administered on the left side and 8 mg was administered on the right side. This was then allowed to dwell for 20-30 minutes.  The J-wire and pigtail catheter was advanced up to the right atrium where a bolus injection contrast was used to demonstrate the pulmonary artery outflow.  Stiff angled Glidewire was then exchanged for the J-wire and the pigtail catheter was used to select the pulmonary outflow track.  The right main pulmonary artery was evaluated first.  A select catheter was then advanced over the Amplatz wire into the distal right main pulmonary artery and hand-injection confirmed the thrombus. 8 mg of tPA was then injected directly into the thrombus within the right distal main pulmonary artery.  After an appropriate dwell time the Amplatz wire was reintroduced through the select catheter and the select catheter removed.  The Penumbra  Cat 8 extra torque catheter was then advanced into the thrombus in the right lower lobe pulmonary artery.  Hand-injection contrast was used to verify positioning and evaluate the  distal anatomy.  Mechanical aspiration was performed using the CAT 8 catheter and a separator.  After multiple passes the catheter was then repositioned into the right middle lobe pulmonary artery and again hand-injection contrast was performed to verify position and evaluate the distal anatomy.  Multiple passes were made using mechanical aspiration in association with a separator.  Lastly, using a combination of the separator catheter and Glidewire the CAT 8 device was negotiated into the right upper lobe pulmonary artery.  Hand-injection of contrast was used to verify positioning and evaluate the distal anatomy.  Multiple passes were then performed using the separator with the CAT 8 penumbra catheter.  Once there was free flow of blood from all 3 lobar arteries the catheter was repositioned to the right main pulmonary artery.  Hand-injection contrast was then used to give an assessment of the effectiveness of thrombectomy.  Satisfied with the thrombectomy on the right, I then used the penumbra CAT 8 device as well as a Glidewire and an angled catheter to select the right main pulmonary artery.  The catheter was then advanced into the left main pulmonary artery.  Then with the catheter in the left main pulmonary artery bolus injection contrast was utilized to demonstrate the thrombus as well as the segmental pulmonary artery vasculature. This demonstrated thrombus in the distal left main pulmonary artery extending into the left upper lobe artery as well as the left lower lobe artery.    The Penumbra Cat 8 extra torque catheter was then advanced into the thrombus in the left lower lobe pulmonary artery.  Hand-injection contrast was used to verify the positioning and evaluate the distal anatomy.  Mechanical aspiration was performed using the CAT 8 catheter and a separator.  The catheter was repositioned to the left main pulmonary artery.  Hand-injection contrast was then performed to give an assessment of the  left pulmonary vasculature and the effectiveness of thrombectomy.  After review these images the catheter and sheath were removed and pressure held. There were no immediate complications.    Findings:   Right heart imaging:  Right atrium and right ventricle and the pulmonary outflow tract appears normal  Right lung: The initial images of the right lung demonstrate thrombus within the distal right main pulmonary artery extending into the right upper, middle and lower lobar arteries.  There is thrombus extending into the segmental branches as well.  Following thrombectomy there appears to be near total resolution of the previously identified thrombus.  Left lung:  The initial images of the left lung demonstrate thrombus within the distal left main pulmonary artery predominantly extending into the left lower lobar arteries.  There is thrombus extending into the segmental branches as well.  Following thrombectomy there appears to be near total resolution of the previously identified thrombus.    Disposition: Patient was taken to the recovery room in stable condition having tolerated the procedure well.  Benjamin Ford 06/21/2024,7:14 PM

## 2024-06-22 ENCOUNTER — Other Ambulatory Visit: Payer: Self-pay

## 2024-06-22 ENCOUNTER — Observation Stay (HOSPITAL_BASED_OUTPATIENT_CLINIC_OR_DEPARTMENT_OTHER)
Admission: EM | Admit: 2024-06-22 | Discharge: 2024-06-27 | Disposition: A | Discharge status: Home / Self Care | Source: Home / Self Care | Attending: Emergency Medicine | Admitting: Emergency Medicine

## 2024-06-22 ENCOUNTER — Encounter: Payer: Self-pay | Admitting: Vascular Surgery

## 2024-06-22 DIAGNOSIS — I82401 Acute embolism and thrombosis of unspecified deep veins of right lower extremity: Secondary | ICD-10-CM | POA: Diagnosis present

## 2024-06-22 DIAGNOSIS — Z8711 Personal history of peptic ulcer disease: Secondary | ICD-10-CM

## 2024-06-22 DIAGNOSIS — R739 Hyperglycemia, unspecified: Secondary | ICD-10-CM | POA: Diagnosis not present

## 2024-06-22 DIAGNOSIS — T45515A Adverse effect of anticoagulants, initial encounter: Secondary | ICD-10-CM | POA: Diagnosis present

## 2024-06-22 DIAGNOSIS — I10 Essential (primary) hypertension: Secondary | ICD-10-CM | POA: Diagnosis present

## 2024-06-22 DIAGNOSIS — Z9852 Vasectomy status: Secondary | ICD-10-CM

## 2024-06-22 DIAGNOSIS — E663 Overweight: Secondary | ICD-10-CM | POA: Diagnosis not present

## 2024-06-22 DIAGNOSIS — K219 Gastro-esophageal reflux disease without esophagitis: Secondary | ICD-10-CM | POA: Diagnosis present

## 2024-06-22 DIAGNOSIS — N400 Enlarged prostate without lower urinary tract symptoms: Secondary | ICD-10-CM | POA: Diagnosis present

## 2024-06-22 DIAGNOSIS — Z7901 Long term (current) use of anticoagulants: Secondary | ICD-10-CM

## 2024-06-22 DIAGNOSIS — Z87891 Personal history of nicotine dependence: Secondary | ICD-10-CM

## 2024-06-22 DIAGNOSIS — T7840XA Allergy, unspecified, initial encounter: Secondary | ICD-10-CM | POA: Diagnosis present

## 2024-06-22 DIAGNOSIS — Z86711 Personal history of pulmonary embolism: Secondary | ICD-10-CM

## 2024-06-22 DIAGNOSIS — Z9013 Acquired absence of bilateral breasts and nipples: Secondary | ICD-10-CM

## 2024-06-22 DIAGNOSIS — T380X5A Adverse effect of glucocorticoids and synthetic analogues, initial encounter: Secondary | ICD-10-CM | POA: Diagnosis present

## 2024-06-22 DIAGNOSIS — T783XXA Angioneurotic edema, initial encounter: Principal | ICD-10-CM | POA: Diagnosis present

## 2024-06-22 DIAGNOSIS — Z825 Family history of asthma and other chronic lower respiratory diseases: Secondary | ICD-10-CM

## 2024-06-22 DIAGNOSIS — Z86718 Personal history of other venous thrombosis and embolism: Secondary | ICD-10-CM

## 2024-06-22 DIAGNOSIS — F419 Anxiety disorder, unspecified: Secondary | ICD-10-CM | POA: Diagnosis present

## 2024-06-22 DIAGNOSIS — Z7951 Long term (current) use of inhaled steroids: Secondary | ICD-10-CM

## 2024-06-22 DIAGNOSIS — J45909 Unspecified asthma, uncomplicated: Secondary | ICD-10-CM | POA: Diagnosis present

## 2024-06-22 DIAGNOSIS — I2609 Other pulmonary embolism with acute cor pulmonale: Secondary | ICD-10-CM | POA: Diagnosis not present

## 2024-06-22 LAB — COMPREHENSIVE METABOLIC PANEL WITH GFR
ALT: 21 U/L (ref 0–44)
AST: 18 U/L (ref 15–41)
Albumin: 3.7 g/dL (ref 3.5–5.0)
Alkaline Phosphatase: 39 U/L (ref 38–126)
Anion gap: 7 (ref 5–15)
BUN: 21 mg/dL (ref 8–23)
CO2: 23 mmol/L (ref 22–32)
Calcium: 9.1 mg/dL (ref 8.9–10.3)
Chloride: 103 mmol/L (ref 98–111)
Creatinine, Ser: 0.74 mg/dL (ref 0.61–1.24)
GFR, Estimated: >60 mL/min (ref >60)
Glucose, Bld: 121 mg/dL — ABNORMAL HIGH (ref 70–99)
Potassium: 4.1 mmol/L (ref 3.5–5.1)
Sodium: 133 mmol/L — ABNORMAL LOW (ref 135–145)
Total Bilirubin: 0.7 mg/dL (ref 0.0–1.2)
Total Protein: 6.5 g/dL (ref 6.5–8.1)

## 2024-06-22 LAB — BASIC METABOLIC PANEL WITH GFR
Anion gap: 7 (ref 5–15)
BUN: 23 mg/dL (ref 8–23)
CO2: 23 mmol/L (ref 22–32)
Calcium: 8.5 mg/dL — ABNORMAL LOW (ref 8.9–10.3)
Chloride: 109 mmol/L (ref 98–111)
Creatinine, Ser: 0.78 mg/dL (ref 0.61–1.24)
GFR, Estimated: >60 mL/min (ref >60)
Glucose, Bld: 198 mg/dL — ABNORMAL HIGH (ref 70–99)
Potassium: 4 mmol/L (ref 3.5–5.1)
Sodium: 139 mmol/L (ref 135–145)

## 2024-06-22 LAB — CBC WITH DIFFERENTIAL/PLATELET
Abs Immature Granulocytes: 0.03 K/uL (ref 0.00–0.07)
Basophils Absolute: 0 K/uL (ref 0.0–0.1)
Basophils Relative: 0 %
Eosinophils Absolute: 0.1 K/uL (ref 0.0–0.5)
Eosinophils Relative: 1 %
HCT: 40.4 % (ref 39.0–52.0)
Hemoglobin: 13.3 g/dL (ref 13.0–17.0)
Immature Granulocytes: 0 %
Lymphocytes Relative: 6 %
Lymphs Abs: 0.5 K/uL — ABNORMAL LOW (ref 0.7–4.0)
MCH: 32.1 pg (ref 26.0–34.0)
MCHC: 32.9 g/dL (ref 30.0–36.0)
MCV: 97.6 fL (ref 80.0–100.0)
Monocytes Absolute: 0.2 K/uL (ref 0.1–1.0)
Monocytes Relative: 2 %
Neutro Abs: 8.4 K/uL — ABNORMAL HIGH (ref 1.7–7.7)
Neutrophils Relative %: 91 %
Platelets: 205 K/uL (ref 150–400)
RBC: 4.14 MIL/uL — ABNORMAL LOW (ref 4.22–5.81)
RDW: 13.5 % (ref 11.5–15.5)
WBC: 9.3 K/uL (ref 4.0–10.5)
nRBC: 0 % (ref 0.0–0.2)

## 2024-06-22 LAB — CBC
HCT: 38.3 % — ABNORMAL LOW (ref 39.0–52.0)
Hemoglobin: 13.3 g/dL (ref 13.0–17.0)
MCH: 33.8 pg (ref 26.0–34.0)
MCHC: 34.7 g/dL (ref 30.0–36.0)
MCV: 97.2 fL (ref 80.0–100.0)
Platelets: 188 K/uL (ref 150–400)
RBC: 3.94 MIL/uL — ABNORMAL LOW (ref 4.22–5.81)
RDW: 13.5 % (ref 11.5–15.5)
WBC: 6.9 K/uL (ref 4.0–10.5)
nRBC: 0 % (ref 0.0–0.2)

## 2024-06-22 LAB — HEPARIN LEVEL (UNFRACTIONATED): Heparin Unfractionated: 0.4 [IU]/mL (ref 0.30–0.70)

## 2024-06-22 MED ORDER — DIPHENHYDRAMINE HCL 50 MG/ML IJ SOLN: 12.5000 mg | Freq: Three times a day (TID) | INTRAMUSCULAR | Status: AC | PRN

## 2024-06-22 MED ORDER — DIPHENHYDRAMINE HCL 25 MG PO CAPS
25.0000 mg | ORAL_CAPSULE | Freq: Three times a day (TID) | ORAL | Status: AC | PRN
  Administered 2024-06-23: 25 mg via ORAL
  Filled 2024-06-22: qty 1

## 2024-06-22 MED ORDER — ONDANSETRON HCL 4 MG/2ML IJ SOLN: 4.0000 mg | Freq: Four times a day (QID) | INTRAMUSCULAR | Status: DC | PRN

## 2024-06-22 MED ORDER — DIPHENHYDRAMINE HCL 50 MG/ML IJ SOLN
25.0000 mg | Freq: Once | INTRAMUSCULAR | Status: AC
  Administered 2024-06-22: 25 mg via INTRAVENOUS
  Filled 2024-06-22: qty 1

## 2024-06-22 MED ORDER — RIVAROXABAN (XARELTO) VTE STARTER PACK (15 & 20 MG)
ORAL_TABLET | ORAL | 0 refills | Status: DC
Start: 2024-06-22 — End: 2024-07-25

## 2024-06-22 MED ORDER — ONDANSETRON HCL 4 MG PO TABS: 4.0000 mg | ORAL_TABLET | Freq: Four times a day (QID) | ORAL | Status: DC | PRN

## 2024-06-22 MED ORDER — APIXABAN 5 MG PO TABS: 5.0000 mg | ORAL_TABLET | Freq: Two times a day (BID) | ORAL | Status: DC

## 2024-06-22 MED ORDER — METHYLPREDNISOLONE SODIUM SUCC 125 MG IJ SOLR
125.0000 mg | Freq: Once | INTRAMUSCULAR | Status: AC
  Administered 2024-06-22: 125 mg via INTRAVENOUS
  Filled 2024-06-22: qty 2

## 2024-06-22 MED ORDER — APIXABAN 5 MG PO TABS: 10.0000 mg | ORAL_TABLET | Freq: Two times a day (BID) | ORAL | Status: DC

## 2024-06-22 MED ORDER — FAMOTIDINE IN NACL 20-0.9 MG/50ML-% IV SOLN
20.0000 mg | Freq: Two times a day (BID) | INTRAVENOUS | Status: DC
  Administered 2024-06-22 – 2024-06-23 (×2): 20 mg via INTRAVENOUS
  Filled 2024-06-22 (×4): qty 50

## 2024-06-22 MED ORDER — POLYETHYLENE GLYCOL 3350 17 G PO PACK: 17.0000 g | PACK | Freq: Every day | ORAL | Status: DC | PRN

## 2024-06-22 MED ORDER — RIVAROXABAN 15 MG PO TABS
15.0000 mg | ORAL_TABLET | Freq: Two times a day (BID) | ORAL | Status: DC
  Administered 2024-06-22 – 2024-06-23 (×3): 15 mg via ORAL
  Filled 2024-06-22 (×4): qty 1

## 2024-06-22 MED ORDER — RIVAROXABAN 20 MG PO TABS: 20.0000 mg | ORAL_TABLET | Freq: Every day | ORAL | Status: DC

## 2024-06-22 MED ORDER — APIXABAN (ELIQUIS) VTE STARTER PACK (10MG AND 5MG)
ORAL_TABLET | ORAL | 0 refills | Status: DC
  Filled 2024-06-22: qty 74, 30d supply, fill #0

## 2024-06-22 MED ORDER — ALBUTEROL SULFATE (2.5 MG/3ML) 0.083% IN NEBU: 3.0000 mL | INHALATION_SOLUTION | Freq: Four times a day (QID) | RESPIRATORY_TRACT | Status: DC | PRN

## 2024-06-22 MED ORDER — ACETAMINOPHEN 650 MG RE SUPP: 650.0000 mg | Freq: Four times a day (QID) | RECTAL | Status: DC | PRN

## 2024-06-22 MED ORDER — ACETAMINOPHEN 325 MG PO TABS: 650.0000 mg | ORAL_TABLET | Freq: Four times a day (QID) | ORAL | Status: DC | PRN

## 2024-06-22 NOTE — ED Notes (Signed)
 Willo, MD, at bedside

## 2024-06-22 NOTE — H&P (Signed)
 History and Physical    Benjamin Ford FMW:[id number] DOB: [birth year] DOA: 06/22/2024  DOS: the patient was seen and examined on 06/22/2024  PCP: Valora Agent, MD   Patient coming from: Home  I have personally briefly reviewed patient's old medical records in [facility]  Chief Complaint: Swelling of the lip  HPI: Benjamin Ford is a pleasant 63 y.o. male with medical history significant for HTN, asthma, GERD, DVT/PE, BPH, anxiety who presented to ED complaining of allergic reaction and swelling of lip.  Patient stated that he underwent thrombectomy for pulmonary embolism in the hospital last night 06/21/2024, was discharged home this morning around 9:30 AM and took his first dose of Eliquis  just prior to discharge.  He states that about 15 minutes later he began to have some tingling and swelling across his both sides of the upper lip.  The swelling slightly progressed and he received Benadryl  and Pepcid  at home.  Still he had swelling progressed and decided to come to the emergency room for evaluation.  Patient also reported that he had some reaction to contrast dye when he had a thrombectomy yesterday. He denies any nausea vomiting fever diarrhea, lightheadedness.  He denied any chest pain or palpitations.  He denied any shortness of breath or difficulty swallowing.  ED Course: Upon arrival to the ED, patient is found to allergic reaction.  He was nontoxic-appearing.  He was given IV Solu-Medrol .  His symptoms improved.  He was offered to go home but they wanted to be watched in the hospital overnight.  Patient had tolerated Xarelto  in the past.  Review of Systems:  ROS  All other systems negative except as noted in the HPI.  Past Medical History:  Diagnosis Date   Anxiety    Asthma    BPH (benign prostatic hyperplasia)    GERD (gastroesophageal reflux disease)    HTN (hypertension)    Insomnia    Palpitations    Stomach ulcer    Testicular mass     Past Surgical  History:  Procedure Laterality Date   COLONOSCOPY WITH PROPOFOL  N/A 05/27/2020   Procedure: COLONOSCOPY WITH PROPOFOL ;  Surgeon: Therisa Bi, MD;  Location: [facility] ENDOSCOPY;  Service: Gastroenterology;  Laterality: N/A;   INGUINAL HERNIA REPAIR  2008   MASTECTOMY Bilateral 1992/1981   PULMONARY THROMBECTOMY N/A 06/21/2024   Procedure: PULMONARY THROMBECTOMY;  Surgeon: Jama Cordella MATSU, MD;  Location: [facility] INVASIVE CV LAB;  Service: Cardiovascular;  Laterality: N/A;   VASECTOMY  1983     reports that he quit smoking about 26 years ago. His smoking use included cigarettes. He started smoking about 40 years ago. He has a 14 pack-year smoking history. He has never used smokeless tobacco. He reports current alcohol use. He reports that he does not use drugs.  Allergies  Allergen Reactions   Apixaban  Swelling    Caused lip swelling. Has taken xarelto  in the past with no complications.   Losartan Swelling    Family History  Problem Relation Age of Onset   Prostate cancer Father    Cancer Father        bone, prostate   COPD Mother    Cancer Mother        lung   Cancer Maternal Grandmother        brain   Aneurysm Paternal Grandmother    Prostate cancer Brother    Prostate cancer Brother    Kidney disease Neg Hx    Bladder Cancer Neg Hx  Prior to Admission medications   Medication Sig Start Date End Date Taking? Authorizing Provider  RIVAROXABAN  (XARELTO ) VTE STARTER PACK (15 & 20 MG) Follow package directions: Take one 15mg  tablet by mouth twice a day. On day 22, switch to one 20mg  tablet once a day. Take with food. 06/22/24  Yes Willo Dunnings, MD  albuterol  (VENTOLIN  HFA) [phone] Base) MCG/ACT inhaler Inhale 2 puffs into the lungs every 6 (six) hours as needed for wheezing or shortness of breath. 11/28/21   Emilio Kelly DASEN, FNP  budesonide -formoterol  (SYMBICORT ) 80-4.5 MCG/ACT inhaler Inhale 2 puffs into the lungs 2 (two) times daily.    [provider]  triamcinolone   cream (KENALOG ) 0.5 % Apply 1 Application topically 2 (two) times daily. 06/04/24   [provider]    Physical Exam: Vitals:   06/22/24 1147 06/22/24 1148 06/22/24 1606  BP: 130/88  (!) 147/96  Pulse: 76  62  Resp: 18  16  Temp: 98.6 F (37 C)    TempSrc: Oral    SpO2: 96%  97%  Weight:  74.8 kg   Height:  5' 6 (1.676 m)     Physical Exam   Constitutional: Alert, awake, calm, comfortable HEENT: Neck supple, both upper and lower lip swelling mild to moderate. Respiratory: Clear to auscultation B/L, no wheezing, no rales.  Cardiovascular: Regular rate and rhythm, no murmurs / rubs / gallops. No extremity edema. 2+ pedal pulses. No carotid bruits.  Abdomen: Soft, no tenderness, Bowel sounds positive.  Musculoskeletal: no clubbing / cyanosis. Good ROM, no contractures. Normal muscle tone.  Skin: no rashes, lesions, ulcers. Neurologic: CN 2-12 grossly intact. Sensation intact, No focal deficit identified Psychiatric: Alert and oriented x 3. Normal mood.    Labs on Admission: I have personally reviewed following labs and imaging studies  CBC: Recent Labs  Lab 06/20/24 1442 06/21/24 0630 06/22/24 0134 06/22/24 1515  WBC 9.6 8.4 6.9 9.3  NEUTROABS  --   --   --  8.4*  HGB 14.6 13.9 13.3 13.3  HCT 43.2 41.1 38.3* 40.4  MCV 97.3 95.8 97.2 97.6  PLT 182 181 188 205   Basic Metabolic Panel: Recent Labs  Lab 06/20/24 1442 06/22/24 0134 06/22/24 1515  NA 139 139 133*  K 4.1 4.0 4.1  CL 102 109 103  CO2 28 23 23   GLUCOSE 114* 198* 121*  BUN 16 23 21   CREATININE 0.79 0.78 0.74  CALCIUM 9.5 8.5* 9.1   GFR: Estimated Creatinine Clearance: 84.2 mL/min (by C-G formula based on SCr of 0.74 mg/dL). Liver Function Tests: Recent Labs  Lab 06/20/24 1442 06/22/24 1515  AST 22 18  ALT 29 21  ALKPHOS 38 39  BILITOT 1.2 0.7  PROT 6.4* 6.5  ALBUMIN 4.0 3.7   No results for input(s): LIPASE, AMYLASE in the last 168 hours. No results for input(s): AMMONIA in  the last 168 hours. Coagulation Profile: Recent Labs  Lab 06/20/24 1443  INR 1.0   Urine analysis:    Component Value Date/Time   COLORURINE YELLOW 01/06/2021 1446   APPEARANCEUR CLEAR 01/06/2021 1446   LABSPEC 1.020 01/06/2021 1446   PHURINE 6.5 01/06/2021 1446   GLUCOSEU NEGATIVE 01/06/2021 1446   HGBUR NEGATIVE 01/06/2021 1446   BILIRUBINUR NEGATIVE 01/06/2021 1446   KETONESUR NEGATIVE 01/06/2021 1446   PROTEINUR NEGATIVE 01/06/2021 1446   NITRITE NEGATIVE 01/06/2021 1446   LEUKOCYTESUR NEGATIVE 01/06/2021 1446    Radiological Exams on Admission: I have personally reviewed images PERIPHERAL VASCULAR CATHETERIZATION  Result Date: 06/21/2024 See surgical note for result.  CT Angio Chest PE W/Cm &/Or Wo Cm Result Date: 06/20/2024 CLINICAL DATA:  Pulmonary embolism (PE) suspected, high prob Right leg pain with occlusive right lower extremity DVT on same date Doppler ultrasound. EXAM: CT ANGIOGRAPHY CHEST WITH CONTRAST TECHNIQUE: Multidetector CT imaging of the chest was performed using the standard protocol during bolus administration of intravenous contrast. Multiplanar CT image reconstructions and MIPs were obtained to evaluate the vascular anatomy. RADIATION DOSE REDUCTION: This exam was performed according to the departmental dose-optimization program which includes automated exposure control, adjustment of the mA and/or kV according to patient size and/or use of iterative reconstruction technique. CONTRAST:  75mL OMNIPAQUE  IOHEXOL  350 MG/ML SOLN COMPARISON:  Chest radiographs 11/10/2021. Right lower extremity venous Doppler ultrasound 06/20/2024. FINDINGS: Cardiovascular: The pulmonary arteries are well opacified with contrast to the level of the segmental branches. Study is positive for bilateral acute pulmonary thromboembolic disease. There is lobar involvement bilaterally, greater on the right, with occlusive components in the right lower lobe. There is a small amount of linear  nonocclusive thrombus in the right main pulmonary artery. Overall heart size is within normal limits, although there is evidence for mild right ventricular strain with a RV to LV ratio of 1.04. No significant pericardial fluid. Mediastinum/Nodes: There are no enlarged mediastinal, hilar or axillary lymph nodes. The thyroid gland, trachea and esophagus demonstrate no significant findings. Lungs/Pleura: No pleural effusion or pneumothorax. Mild centrilobular emphysema. There is mild dependent atelectasis in both lung bases. Solid left lower lobe pulmonary nodule measures 7 mm on image 107/6. No confluent airspace disease or definite pulmonary infarct. Upper abdomen: No acute findings are identified in the visualized upper abdomen. Mild hepatic steatosis. Punctate nonobstructing left renal calculus. Musculoskeletal/Chest wall: There is no chest wall mass or suspicious osseous finding. Multilevel spondylosis. Review of the MIP images confirms the above findings. IMPRESSION: 1. Study is positive for bilateral acute pulmonary thromboembolic disease with lobar involvement bilaterally, greater on the right. There is evidence for mild right ventricular strain (RV/LV Ratio = 1.04) consistent with at least submassive (intermediate risk) PE. The presence of right heart strain has been associated with an increased risk of morbidity and mortality. Please refer to the Code PE Focused order set in EPIC. 2. No confluent airspace disease or definite pulmonary infarct. 3. Solid 7 mm left lower lobe pulmonary nodule. Per Fleischner Society Guidelines, recommend a non-contrast Chest CT at 6-12 months. If patient is high risk for malignancy, consider an additional non-contrast Chest CT at 18-24 months. If patient is low risk for malignancy, non-contrast Chest CT at 18-24 months is optional. These guidelines do not apply to immunocompromised patients and patients with cancer. Follow up in patients with significant comorbidities as  clinically warranted. For lung cancer screening, adhere to Lung-RADS guidelines. Reference: Radiology. 2017; 284(1):228-43. 4.  Emphysema (ICD10-J43.9). 5. Critical Value/emergent results were called by telephone at the time of interpretation on 06/20/2024 at 6:00 pm to provider [facility] , who verbally acknowledged these results. Electronically Signed   By: Elsie Perone M.D.   On: 06/20/2024 18:02    EKG: My personal interpretation of EKG shows: Not available    Assessment/Plan Principal Problem:   Angioedema    Assessment and Plan:  63 year old male with past medical history of asthma, HTN, GERD, DVT/PE, BPH, anxiety who presented to ED complaining of lip swelling after starting Eliquis  this morning.  Also he had some kind of reaction after contrast dye yesterday but  not sure what was that.  1.  Allergic reaction/angioedema - He was given Pepcid  and Benadryl  prior to coming to ED and in the emergency room. - He was given Solu-Medrol  in the emergency room. - His symptoms improved. - Patient has anxiety and family and they feel comfortable to stay in the hospital and monitor overnight. - He will be placed in observation. - Will place him on Benadryl  and Pepcid .  He received 1 dose of Solu-Medrol  and I do not think we need to continue that at this point as his symptoms are improving. - Will stop Eliquis  and olmesartan that he takes at home.  2.  Recent DVT/PE - There was a concern about angioedema from Eliquis  - Pharmacy advised for Xarelto  and patient tolerated in the past. - Will start him on Xarelto .  2.  HTN - Blood pressure is stable - Hold off olmesartan due to concern for angioedema - Discussed with primary care and restart medication or substitute the medication.     DVT prophylaxis: Xarelto  Code Status: Full Code Family Communication: Wife and daughter  Disposition Plan: Home  Consults called: None  Admission status: Observation, Med-Surg   Nena Rebel,  MD [facility] 06/22/2024, 5:15 PM

## 2024-06-22 NOTE — ED Triage Notes (Signed)
 Pt presents with facial swelling after taking a new medication, Eliquis . He was admitted to the hospital and had a pulmonary thrombectomy last night and was started on Eliquis  this AM and d/c'd home. When he got home he noticed paresthesia around the lips and upper lip swelling. Pt also having pain in his R jaw. Denies difficulty swallowing or trouble breathing. He also reports recently being treated for poison ivy and finished prednisone  on Tuesday.   Pt took 50 mg of benadryl  and a dose of Pepcid  around 09:30.

## 2024-06-22 NOTE — Discharge Instructions (Addendum)
 Your facial swelling today is called angioedema, and may have been caused by your Eliquis  or your olmesartan.  You should stop both of these medications and you were prescribed a new blood thinner, Xarelto , to take instead of Eliquis .  Please schedule follow-up with your primary care doctor and return to the ER for worsening symptoms.

## 2024-06-22 NOTE — ED Provider Notes (Signed)
 [facility] Provider Note    Event Date/Time   First MD Initiated Contact with Patient 06/22/24 1151     (approximate)   History   Chief Complaint Allergic Reaction (/)   HPI  Kalil Woessner is a 63 y.o. male with past medical history of hypertension, asthma, GERD, DVT/PE, BPH, and anxiety who presents to the ED complaining of allergic reaction.  Patient reports that he underwent thrombectomy for pulmonary embolism in the hospital last night, was discharged home this morning around 9:30 AM and took his first dose of Eliquis  just prior to discharge.  He states that about 15 minutes later he began to have some tingling and swelling across both sides of his upper lip.  Swelling has slightly progressed since then despite a dose of Benadryl  and Pepcid  at home.  He feels like the right side of his face is slightly swollen but he denies any itching or rash.  He has not had any nausea, vomiting, diarrhea, or lightheadedness.  He does state that he is starting to feel like he needs to cough to clear his throat, but denies any difficulty breathing or difficulty swallowing.     Physical Exam   Triage Vital Signs: ED Triage Vitals  Encounter Vitals Group     BP 06/22/24 1147 130/88     Girls Systolic BP Percentile --      Girls Diastolic BP Percentile --      Boys Systolic BP Percentile --      Boys Diastolic BP Percentile --      Pulse Rate 06/22/24 1147 76     Resp 06/22/24 1147 18     Temp 06/22/24 1147 98.6 F (37 C)     Temp Source 06/22/24 1147 Oral     SpO2 06/22/24 1147 96 %     Weight 06/22/24 1148 165 lb (74.8 kg)     Height 06/22/24 1148 5' 6 (1.676 m)     Head Circumference --      Peak Flow --      Pain Score 06/22/24 1148 4     Pain Loc --      Pain Education --      Exclude from Growth Chart --     Most recent vital signs: Vitals:   06/22/24 1147  BP: 130/88  Pulse: 76  Resp: 18  Temp: 98.6 F (37 C)  SpO2: 96%     Constitutional: Alert and oriented. Eyes: Conjunctivae are normal. Head: Atraumatic. Nose: No congestion/rhinnorhea. Mouth/Throat: Mucous membranes are moist.  Mild upper lip edema noted, no tongue edema and posterior oropharynx is clear. Cardiovascular: Normal rate, regular rhythm. Grossly normal heart sounds.  2+ radial pulses bilaterally. Respiratory: Normal respiratory effort.  No retractions. Lungs CTAB. Gastrointestinal: Soft and nontender. No distention. Musculoskeletal: No lower extremity tenderness nor edema.  Neurologic:  Normal speech and language. No gross focal neurologic deficits are appreciated.    ED Results / Procedures / Treatments   Labs (all labs ordered are listed, but only abnormal results are displayed) Labs Reviewed - No data to display   PROCEDURES:  Critical Care performed: No  Procedures   MEDICATIONS ORDERED IN ED: Medications  methylPREDNISolone  sodium succinate (SOLU-MEDROL ) 125 mg/2 mL injection 125 mg (125 mg Intravenous Given 06/22/24 1209)     IMPRESSION / MDM / ASSESSMENT AND PLAN / ED COURSE  I reviewed the triage vital signs and the nursing notes.  63 y.o. male with past medical history of hypertension, asthma, GERD, DVT/PE, BPH, and anxiety who presents to the ED complaining of lip swelling after starting Eliquis  this morning.  Patient's presentation is most consistent with acute presentation with potential threat to life or bodily function.  Differential diagnosis includes, but is not limited to, alert reaction, anaphylaxis, angioedema.  Patient nontoxic-appearing and in no acute distress, vital signs are unremarkable.  He has mild swelling of his upper lip, no swelling noted around his tongue or posterior oropharynx.  He is not in any distress, no other symptoms to suggest anaphylaxis at this time and symptoms seem more likely related to angioedema rather than true allergy.  Cases of angioedema with  Eliquis  have been documented.  He took Benadryl  and Pepcid  prior to arrival, will treat with IV Solu-Medrol .  He is not a candidate for TXA for angioedema given recent pulmonary embolism, will observe here in the ED.  Patient with stable angioedema on reassessment, will continue to observe here in the ED.  I did discuss his anticoagulation with pharmacist, who agrees that angioedema is possible with Eliquis , but would also consider oral losartan as the source of angioedema. We will stop both Eliquis  and olmesartan, transition to Xarelto  as pharmacy states there is minimal overlap and should not cause angioedema.  Patient does state that he has tolerated Xarelto  in the past.  Patient turned over to oncoming provider pending additional observation, plan for discharge home afterwards.      FINAL CLINICAL IMPRESSION(S) / ED DIAGNOSES   Final diagnoses:  Angioedema, initial encounter     Rx / DC Orders   ED Discharge Orders          Ordered    RIVAROXABAN  (XARELTO ) VTE STARTER PACK (15 & 20 MG)        06/22/24 1452             Note:  This document was prepared using Dragon voice recognition software and may include unintentional dictation errors.   Willo Dunnings, MD 06/22/24 [phone]

## 2024-06-22 NOTE — Plan of Care (Signed)
  Problem: Clinical Measurements: Goal: Ability to maintain clinical measurements within normal limits will improve Outcome: Progressing   Problem: Clinical Measurements: Goal: Cardiovascular complication will be avoided Outcome: Progressing   Problem: Clinical Measurements: Goal: Respiratory complications will improve Outcome: Progressing   Problem: Activity: Goal: Risk for activity intolerance will decrease Outcome: Progressing   Problem: Pain Managment: Goal: General experience of comfort will improve and/or be controlled Outcome: Progressing

## 2024-06-22 NOTE — Progress Notes (Incomplete)
  Progress Note    06/22/2024 7:24 AM 1 Day Post-Op  Subjective:  Dyer Klug is a 63 yo male now POD #1 from:  Procedure(s) Performed:             1.  Contrast injection right heart and bilateral pulmonary arteries             2.  Thrombolysis bilateral pulmonary arteries with 8 mg of TPA             3.  Mechanical thrombectomy bilateral lobar pulmonary arteries for removal of pulmonary emboli using the Penumbra CAT 8 thrombectomy catheter.             4.  Selective catheter placement right upper lobe pulmonary artery, middle lobe pulmonary artery and lower lobe pulmonary artery             5.  Selective catheter placement left upper lobe pulmonary artery and lower lobe pulmonary artery Vitals:   06/21/24 2151 06/22/24 0453  BP: 129/82 125/78  Pulse: 74 60  Resp: 17 16  Temp: 97.8 F (36.6 C) 97.8 F (36.6 C)  SpO2: 100% 95%   Physical Exam: Cardiac:  *** Lungs:  *** Incisions:  *** Extremities:  *** Abdomen:  *** Neurologic: ***  CBC    Component Value Date/Time   WBC 6.9 06/22/2024 0134   RBC 3.94 (L) 06/22/2024 0134   HGB 13.3 06/22/2024 0134   HGB 15.0 03/19/2021 0914   HCT 38.3 (L) 06/22/2024 0134   HCT 44.5 03/19/2021 0914   PLT 188 06/22/2024 0134   PLT 238 03/19/2021 0914   MCV 97.2 06/22/2024 0134   MCV 96 03/19/2021 0914   MCH 33.8 06/22/2024 0134   MCHC 34.7 06/22/2024 0134   RDW 13.5 06/22/2024 0134   RDW 12.3 03/19/2021 0914   LYMPHSABS 2.1 12/30/2021 1731   LYMPHSABS 1.8 03/19/2021 0914   MONOABS 0.7 12/30/2021 1731   EOSABS 0.5 12/30/2021 1731   EOSABS 0.4 03/19/2021 0914   BASOSABS 0.1 12/30/2021 1731   BASOSABS 0.1 03/19/2021 0914    BMET    Component Value Date/Time   NA 139 06/22/2024 0134   NA 143 03/19/2021 0914   K 4.0 06/22/2024 0134   CL 109 06/22/2024 0134   CO2 23 06/22/2024 0134   GLUCOSE 198 (H) 06/22/2024 0134   BUN 23 06/22/2024 0134   BUN 17 03/19/2021 0914   CREATININE 0.78 06/22/2024 0134   CALCIUM 8.5 (L)  06/22/2024 0134   GFRNONAA >60 06/22/2024 0134   GFRAA 106 05/28/2020 1549    INR    Component Value Date/Time   INR 1.0 06/20/2024 1443     Intake/Output Summary (Last 24 hours) at 06/22/2024 0724 Last data filed at 06/22/2024 0147 Gross per 24 hour  Intake --  Output 400 ml  Net -400 ml     Assessment/Plan:  63 y.o. male is s/p *** 1 Day Post-Op  *** DVT prophylaxis:  ***   Abraham Margulies R Jamala Kohen [facility] 06/22/2024 7:24 AM

## 2024-06-22 NOTE — Discharge Summary (Addendum)
 Physician Discharge Summary   Patient: Benjamin Ford MRN: [id number] DOB: Jan 30, 1961  Admit date:     06/20/2024  Discharge date: 06/22/24  Discharge Physician: Charlie Patterson   PCP: Valora Agent, MD   Recommendations at discharge:    Follow pcp 5 days Follow-up with vascular surgery  Discharge Diagnoses: Principal Problem:   Pulmonary embolism (HCC) Active Problems:   Acute deep vein thrombosis (DVT) of right lower extremity (HCC)   Essential hypertension   Overweight (BMI 25.0-29.9)    Hospital Course:  63 y.o. male with medical history significant of hypertension, asthma, GERD, BPH, anxiety who presented to the ED today for evaluation of right leg pain that started on Friday.  Patient initially thought it was a charley horse but due to progressive pain, he contacted his doctor who suggested he come in for evaluation.  He reports pain improves at rest with legs elevated and worsens with upright positioning and ambulation.  He denies any shortness of breath or chest pain.  He does report prior history of right lower extremity DVT about a year ago in the setting of being in a boot for plantar fasciitis and taking a flight to [place]  around that time as well.  He denies any known family history of blood clots.  He denies any recent surgeries, prolonged immobility or travel.  He is a non-smoker.  Patient reports otherwise recently in good health with no fevers chills, nausea vomiting abdominal pain diarrhea, cough congestion or sore throat.  No other recent illnesses or symptoms.   In the ED, initial vitals temp 98.3 F, heart rate 87, respirations 20, BP 175/98, SPO2 100% on room air.  Labs were obtained including CMP and CBC which were notable only for nonfasting glucose 114, total protein 6.4.  CBC was normal.  INR 1.0.   Right lower extremity Doppler ultrasound showed occlusive thrombus in the right calf veins extending into the popliteal vein proximally to the level of the  distal right common femoral vein.     Patient was seen in the ED by vascular surgery who recommended observation admission and further evaluation including CTA chest to rule out PE.  7/16.  Patient with submassive bilateral PE on CT scan.  Thrombolysis of bilateral pulmonary arteries with tPA and mechanical thrombectomy of bilateral lobar pulmonary arteries. 7/17.  Patient feeling much improved.  Converted from heparin  drip over to Eliquis  and discharged home today.  Assessment and Plan: * Pulmonary embolism (HCC) Bilateral submassive Pulmonary embolism on ct scan.  Status post thrombolysis and mechanical thrombectomy on 7/16 by Dr. Jama.  On 7/17 heparin  drip switched over to Eliquis  and patient was doing well and sent home.  Acute deep vein thrombosis (DVT) of right lower extremity (HCC) No pain once started on blood thinner.  Continue Eliquis   Essential hypertension On irbesartan   Overweight (BMI 25.0-29.9) BMI 26.63 with current height and weight in computer.         Consultants: Vascular surgery Procedures performed: Pulmonary thrombolysis and mechanical thrombectomy Disposition: Home Diet recommendation:  Cardiac diet DISCHARGE MEDICATION: Allergies as of 06/22/2024       Reactions   Apixaban  Swelling   Caused lip swelling. Has taken xarelto  in the past with no complications.        Medication List     STOP taking these medications    olmesartan 40 MG tablet Commonly known as: BENICAR       TAKE these medications    albuterol  108 (90 Base) MCG/ACT inhaler  Commonly known as: VENTOLIN  HFA Inhale 2 puffs into the lungs every 6 (six) hours as needed for wheezing or shortness of breath.   budesonide -formoterol  80-4.5 MCG/ACT inhaler Commonly known as: SYMBICORT  Inhale 2 puffs into the lungs 2 (two) times daily.   triamcinolone  cream 0.5 % Commonly known as: KENALOG  Apply 1 Application topically 2 (two) times daily.      Patient was prescribed  Eliquis  starter pack upon going home and then was switched over to Xarelto  starter pack when he came back to the ER with angioedema  Follow-up Information     Elisabeth Round R, NP Follow up on 07/25/2024.   Specialty: Vascular Surgery Why: Bilateral Venous Ultrasounds lower extremities for follow up DVT's  Hospital Follow up: August, 19th @ 3 PM. You will have an ultrasound then a follow up with the doctor right after. please bring you insurance card to the appointment. Arrive 15 minutes early. Contact information: [address] [place] [address] [phone]         Valora Agent, MD Follow up in 5 day(s).   Specialty: Family Medicine Why: The office will call you to set up an appointment. Contact information: [address] [place] [address] [phone]                Discharge Exam: Fredricka Weights   06/20/24 1223  Weight: 74.8 kg   Physical Exam HENT:     Head: Normocephalic.  Eyes:     General: Lids are normal.     Conjunctiva/sclera: Conjunctivae normal.  Cardiovascular:     Rate and Rhythm: Normal rate and regular rhythm.     Heart sounds: Normal heart sounds, S1 normal and S2 normal.  Pulmonary:     Breath sounds: No decreased breath sounds, wheezing, rhonchi or rales.  Abdominal:     Palpations: Abdomen is soft.     Tenderness: There is no abdominal tenderness.  Musculoskeletal:     Right lower leg: No swelling.     Left lower leg: No swelling.  Skin:    General: Skin is warm.     Findings: No rash.  Neurological:     Mental Status: He is alert and oriented to person, place, and time.      Condition at discharge: stable  The results of significant diagnostics from this hospitalization (including imaging, microbiology, ancillary and laboratory) are listed below for reference.   Imaging Studies: PERIPHERAL VASCULAR CATHETERIZATION Result Date: 06/21/2024 See surgical note for result.  CT Angio Chest  PE W/Cm &/Or Wo Cm Result Date: 06/20/2024 CLINICAL DATA:  Pulmonary embolism (PE) suspected, high prob Right leg pain with occlusive right lower extremity DVT on same date Doppler ultrasound. EXAM: CT ANGIOGRAPHY CHEST WITH CONTRAST TECHNIQUE: Multidetector CT imaging of the chest was performed using the standard protocol during bolus administration of intravenous contrast. Multiplanar CT image reconstructions and MIPs were obtained to evaluate the vascular anatomy. RADIATION DOSE REDUCTION: This exam was performed according to the departmental dose-optimization program which includes automated exposure control, adjustment of the mA and/or kV according to patient size and/or use of iterative reconstruction technique. CONTRAST:  75mL OMNIPAQUE  IOHEXOL  350 MG/ML SOLN COMPARISON:  Chest radiographs 11/10/2021. Right lower extremity venous Doppler ultrasound 06/20/2024. FINDINGS: Cardiovascular: The pulmonary arteries are well opacified with contrast to the level of the segmental branches. Study is positive for bilateral acute pulmonary thromboembolic disease. There is lobar involvement bilaterally, greater on the right, with occlusive components in  the right lower lobe. There is a small amount of linear nonocclusive thrombus in the right main pulmonary artery. Overall heart size is within normal limits, although there is evidence for mild right ventricular strain with a RV to LV ratio of 1.04. No significant pericardial fluid. Mediastinum/Nodes: There are no enlarged mediastinal, hilar or axillary lymph nodes. The thyroid gland, trachea and esophagus demonstrate no significant findings. Lungs/Pleura: No pleural effusion or pneumothorax. Mild centrilobular emphysema. There is mild dependent atelectasis in both lung bases. Solid left lower lobe pulmonary nodule measures 7 mm on image 107/6. No confluent airspace disease or definite pulmonary infarct. Upper abdomen: No acute findings are identified in the visualized  upper abdomen. Mild hepatic steatosis. Punctate nonobstructing left renal calculus. Musculoskeletal/Chest wall: There is no chest wall mass or suspicious osseous finding. Multilevel spondylosis. Review of the MIP images confirms the above findings. IMPRESSION: 1. Study is positive for bilateral acute pulmonary thromboembolic disease with lobar involvement bilaterally, greater on the right. There is evidence for mild right ventricular strain (RV/LV Ratio = 1.04) consistent with at least submassive (intermediate risk) PE. The presence of right heart strain has been associated with an increased risk of morbidity and mortality. Please refer to the Code PE Focused order set in EPIC. 2. No confluent airspace disease or definite pulmonary infarct. 3. Solid 7 mm left lower lobe pulmonary nodule. Per Fleischner Society Guidelines, recommend a non-contrast Chest CT at 6-12 months. If patient is high risk for malignancy, consider an additional non-contrast Chest CT at 18-24 months. If patient is low risk for malignancy, non-contrast Chest CT at 18-24 months is optional. These guidelines do not apply to immunocompromised patients and patients with cancer. Follow up in patients with significant comorbidities as clinically warranted. For lung cancer screening, adhere to Lung-RADS guidelines. Reference: Radiology. 2017; 284(1):228-43. 4.  Emphysema (ICD10-J43.9). 5. Critical Value/emergent results were called by telephone at the time of interpretation on 06/20/2024 at 6:00 pm to provider [facility] , who verbally acknowledged these results. Electronically Signed   By: Elsie Perone M.D.   On: 06/20/2024 18:02   US  Venous Img Lower Unilateral Right (DVT) Result Date: 06/20/2024 CLINICAL DATA:  History of right calf DVT 1 year ago EXAM: RIGHT LOWER EXTREMITY VENOUS DOPPLER ULTRASOUND TECHNIQUE: Gray-scale sonography with compression, as well as color and duplex ultrasound, were performed to evaluate the deep venous  system(s) from the level of the common femoral vein through the popliteal and proximal calf veins. COMPARISON:  None Available. FINDINGS: VENOUS Occlusive thrombus within the right calf veins extending into the popliteal vein proximally to the level of the distal right common femoral vein. The great saphenous vein and profundus femoris are normally compressible and patent. Limited views of the contralateral common femoral vein are unremarkable. OTHER None. Limitations: none IMPRESSION: Occlusive thrombus within the right calf veins extending into the popliteal vein proximally to the level of the distal right common femoral vein. These results will be called to the ordering clinician or representative by the Radiology Department at the imaging location. Electronically Signed   By: Limin  Xu M.D.   On: 06/20/2024 11:42    Microbiology: Results for orders placed or performed during the hospital encounter of 12/01/20  Resp Panel by RT-PCR (Flu A&B, Covid) Nasopharyngeal Swab     Status: Abnormal   Collection Time: 12/01/20  7:53 PM   Specimen: Nasopharyngeal Swab; Nasopharyngeal(NP) swabs in vial transport medium  Result Value Ref Range Status   SARS Coronavirus 2 by RT  PCR POSITIVE (A) NEGATIVE Final    Comment: RESULT CALLED TO, READ BACK BY AND VERIFIED WITH: VANESSA ASHLEY @2139  ON 12/01/20 SKL (NOTE) SARS-CoV-2 target nucleic acids are DETECTED.  The SARS-CoV-2 RNA is generally detectable in upper respiratory specimens during the acute phase of infection. Positive results are indicative of the presence of the identified virus, but do not rule out bacterial infection or co-infection with other pathogens not detected by the test. Clinical correlation with patient history and other diagnostic information is necessary to determine patient infection status. The expected result is Negative.  Fact Sheet for Patients: BloggerCourse.com  Fact Sheet for Healthcare  Providers: SeriousBroker.it  This test is not yet approved or cleared by the [place]  FDA and  has been authorized for detection and/or diagnosis of SARS-CoV-2 by FDA under an Emergency Use Authorization (EUA).  This EUA will remain in effect (meaning this test can  be used) for the duration of  the COVID-19 declaration under Section 564(b)(1) of the Act, 21 U.S.C. section 360bbb-3(b)(1), unless the authorization is terminated or revoked sooner.     Influenza A by PCR NEGATIVE NEGATIVE Final   Influenza B by PCR NEGATIVE NEGATIVE Final    Comment: (NOTE) The Xpert Xpress SARS-CoV-2/FLU/RSV plus assay is intended as an aid in the diagnosis of influenza from Nasopharyngeal swab specimens and should not be used as a sole basis for treatment. Nasal washings and aspirates are unacceptable for Xpert Xpress SARS-CoV-2/FLU/RSV testing.  Fact Sheet for Patients: BloggerCourse.com  Fact Sheet for Healthcare Providers: SeriousBroker.it  This test is not yet approved or cleared by the [place]  FDA and has been authorized for detection and/or diagnosis of SARS-CoV-2 by FDA under an Emergency Use Authorization (EUA). This EUA will remain in effect (meaning this test can be used) for the duration of the COVID-19 declaration under Section 564(b)(1) of the Act, 21 U.S.C. section 360bbb-3(b)(1), unless the authorization is terminated or revoked.  Performed at [facility], [address] [place], [place] [address]     Labs: CBC: Recent Labs  Lab 06/20/24 1442 06/21/24 0630 06/22/24 0134 06/22/24 1515  WBC 9.6 8.4 6.9 9.3  NEUTROABS  --   --   --  8.4*  HGB 14.6 13.9 13.3 13.3  HCT 43.2 41.1 38.3* 40.4  MCV 97.3 95.8 97.2 97.6  PLT 182 181 188 205   Basic Metabolic Panel: Recent Labs  Lab 06/20/24 1442 06/22/24 0134 06/22/24 1515  NA 139 139 133*  K 4.1 4.0 4.1  CL 102 109 103   CO2 28 23 23   GLUCOSE 114* 198* 121*  BUN 16 23 21   CREATININE 0.79 0.78 0.74  CALCIUM 9.5 8.5* 9.1   Liver Function Tests: Recent Labs  Lab 06/20/24 1442 06/22/24 1515  AST 22 18  ALT 29 21  ALKPHOS 38 39  BILITOT 1.2 0.7  PROT 6.4* 6.5  ALBUMIN 4.0 3.7   CBG: No results for input(s): GLUCAP in the last 168 hours.  Discharge time spent: greater than 30 minutes.  Signed: Charlie Patterson, MD [facility] 06/22/2024

## 2024-06-22 NOTE — Consult Note (Signed)
 PHARMACY - ANTICOAGULATION CONSULT NOTE  Pharmacy Consult for Heparin  Indication: DVT  No Known Allergies  Patient Measurements: Height: 5' 6 (167.6 cm) Weight: 74.8 kg (165 lb) IBW/kg (Calculated) : 63.8 HEPARIN  DW (KG): 74.8  Vital Signs: Temp: 97.8 F (36.6 C) (07/16 2151) Temp Source: Oral (07/16 2135) BP: 129/82 (07/16 2151) Pulse Rate: 74 (07/16 2151)  Labs: Recent Labs    06/20/24 1442 06/20/24 1443 06/20/24 2307 06/21/24 0630 06/22/24 0134  HGB 14.6  --   --  13.9 13.3  HCT 43.2  --   --  41.1 38.3*  PLT 182  --   --  181 188  APTT  --  26  --   --   --   LABPROT  --  14.1  --   --   --   INR  --  1.0  --   --   --   HEPARINUNFRC  --   --  0.56 0.59 0.40  CREATININE 0.79  --   --   --  0.78    Estimated Creatinine Clearance: 84.2 mL/min (by C-G formula based on SCr of 0.78 mg/dL).   Medical History: Past Medical History:  Diagnosis Date   Anxiety    Asthma    BPH (benign prostatic hyperplasia)    GERD (gastroesophageal reflux disease)    HTN (hypertension)    Insomnia    Palpitations    Stomach ulcer    Testicular mass     Pertinent Medications:  No history of chronic anticoagulation PTA  Assessment: 63yo male presenting to the ED with 5 days of right leg pain. Ultrasound imaging of right lower extremity positive for occlusive thrombus within the right calf veins extending into the popliteal vein proximally to the level of the distal right common femoral vein. Pharmacy has been consulted to initiate and monitor heparin  infusion.  Baseline labs: aPTT 26sec, INR 1.0, Hgb 14.6, Plts 182  Goal of Therapy:  Heparin  level 0.3-0.7 units/ml Monitor platelets by anticoagulation protocol: Yes   Plan:  7/15: HL @ 2307 = 0.56, therapeutic X 1 7/15: HL @ 0630 = 0.59, therapeutic X 2 7/16: heparin  drip stopped for vasc procedure@1737 , to be resumed @1945  with no bolus, ok to follow nomogram thereafter 7/17: HL @ 0134 = 0.40, therapeutic X 3   Will  continue pt on current rate and recheck HL on 7/18 with HL  Continue to monitor H&H and platelets  Manasa Spease D Clinical Pharmacist 06/22/2024

## 2024-06-22 NOTE — Progress Notes (Signed)
 Progress Note    06/22/2024 1:06 PM * No surgery found *  Subjective:  Benjamin Ford is a 63 yo male who is now POD #1 from:   Procedure(s) Performed:             1.  Contrast injection right heart and bilateral pulmonary arteries             2.  Thrombolysis bilateral pulmonary arteries with 8 mg of TPA             3.  Mechanical thrombectomy bilateral lobar pulmonary arteries for removal of pulmonary emboli using the Penumbra CAT 8 thrombectomy catheter.             4.  Selective catheter placement right upper lobe pulmonary artery, middle lobe pulmonary artery and lower lobe pulmonary artery             5.  Selective catheter placement left upper lobe pulmonary artery and lower lobe pulmonary artery  Patient is resting comfortably in bed this morning.  He is currently on a heparin  infusion postprocedure this morning.  Patient endorses he is not having any pain at all.  Patient endorses right leg is not swollen and does not hurt.  He also endorses is not as tight as it was when he first came to the hospital.  Patient also endorses he is breathing better now that he has in the last 5 years.  No complaints overnight vitals are remained stable.   Vitals:   06/22/24 1147  BP: 130/88  Pulse: 76  Resp: 18  Temp: 98.6 F (37 C)  SpO2: 96%   Physical Exam: Cardiac:  RRR, Normal S1, S2. No murmurs appreciated. Lungs:  Non labored breathing while at rest. Clear on auscultation. More diminished on the right verses the left.  Incisions:  None Extremities:  All extremities are warm to the touch with palpable pulses. Right lower extremity is less painful and without swelling this am.  Abdomen:  Positive bowel sounds throughout, soft non tender and non distended.  Neurologic: AAOX3, answerers all questions and follows commands appropriately.  CBC    Component Value Date/Time   WBC 6.9 06/22/2024 0134   RBC 3.94 (L) 06/22/2024 0134   HGB 13.3 06/22/2024 0134   HGB 15.0 03/19/2021 0914    HCT 38.3 (L) 06/22/2024 0134   HCT 44.5 03/19/2021 0914   PLT 188 06/22/2024 0134   PLT 238 03/19/2021 0914   MCV 97.2 06/22/2024 0134   MCV 96 03/19/2021 0914   MCH 33.8 06/22/2024 0134   MCHC 34.7 06/22/2024 0134   RDW 13.5 06/22/2024 0134   RDW 12.3 03/19/2021 0914   LYMPHSABS 2.1 12/30/2021 1731   LYMPHSABS 1.8 03/19/2021 0914   MONOABS 0.7 12/30/2021 1731   EOSABS 0.5 12/30/2021 1731   EOSABS 0.4 03/19/2021 0914   BASOSABS 0.1 12/30/2021 1731   BASOSABS 0.1 03/19/2021 0914    BMET    Component Value Date/Time   NA 139 06/22/2024 0134   NA 143 03/19/2021 0914   K 4.0 06/22/2024 0134   CL 109 06/22/2024 0134   CO2 23 06/22/2024 0134   GLUCOSE 198 (H) 06/22/2024 0134   BUN 23 06/22/2024 0134   BUN 17 03/19/2021 0914   CREATININE 0.78 06/22/2024 0134   CALCIUM 8.5 (L) 06/22/2024 0134   GFRNONAA >60 06/22/2024 0134   GFRAA 106 05/28/2020 1549    INR    Component Value Date/Time   INR 1.0 06/20/2024 1443  No intake or output data in the 24 hours ending 06/22/24 1306   Assessment/Plan:  63 y.o. male is s/p Pulmonary Thrombectomy * No surgery found *   PLAN Discontinue heparin  infusion this morning.  Will convert the patient over to oral anticoagulation.  Patient will be started on Eliquis  10 mg twice daily for 7 days then Eliquis  for 5 mg twice daily indefinitely.  Patient will follow-up with vein and vascular surgery as scheduled.  Upon return we will do vascular venous ultrasounds to follow-up with his right lower extremity DVT.  Okay per vascular surgery for patient discharge home today after receiving first dose of Eliquis  is oral anticoagulation.  DVT prophylaxis: Heparin  infusion converted to oral anticoagulation.   Gwendlyn JONELLE [facility]ank [facility] 06/22/2024 1:06 PM

## 2024-06-22 NOTE — ED Provider Notes (Signed)
 4:21 PM Assumed care for off going team.   Blood pressure (!) 147/96, pulse 62, temperature 98.6 F (37 C), temperature source Oral, resp. rate 16, height 5' 6 (1.676 m), weight 74.8 kg, SpO2 97%.  See their HPI for full report but in brief pending re-eval  Around 305 they noticed some increased swelling of the neck.  We have opted to continue to monitor and him as it still very minimal.  4:21 PM family reports that the angioedema is getting worse with now some swelling in the upper lip  We discussed the pros and cons of trialing epinephrine but at this time they want to hold off and I do not feel like it would help given this is most likely angioedema.  After discussion with family they felt more comfortable with admission to the hospital patient given another dose of Benadryl  and I will discuss with hospitalist for admission       Ernest Ronal BRAVO, MD 06/22/24 [phone]

## 2024-06-23 DIAGNOSIS — T783XXA Angioneurotic edema, initial encounter: Secondary | ICD-10-CM | POA: Diagnosis not present

## 2024-06-23 LAB — CBC
HCT: 39.5 % (ref 39.0–52.0)
Hemoglobin: 13.3 g/dL (ref 13.0–17.0)
MCH: 32.9 pg (ref 26.0–34.0)
MCHC: 33.7 g/dL (ref 30.0–36.0)
MCV: 97.8 fL (ref 80.0–100.0)
Platelets: 232 K/uL (ref 150–400)
RBC: 4.04 MIL/uL — ABNORMAL LOW (ref 4.22–5.81)
RDW: 13.4 % (ref 11.5–15.5)
WBC: 10.1 K/uL (ref 4.0–10.5)
nRBC: 0 % (ref 0.0–0.2)

## 2024-06-23 LAB — COMPREHENSIVE METABOLIC PANEL WITH GFR
ALT: 18 U/L (ref 0–44)
AST: 18 U/L (ref 15–41)
Albumin: 3.4 g/dL — ABNORMAL LOW (ref 3.5–5.0)
Alkaline Phosphatase: 36 U/L — ABNORMAL LOW (ref 38–126)
Anion gap: 8 (ref 5–15)
BUN: 23 mg/dL (ref 8–23)
CO2: 23 mmol/L (ref 22–32)
Calcium: 9.1 mg/dL (ref 8.9–10.3)
Chloride: 107 mmol/L (ref 98–111)
Creatinine, Ser: 0.82 mg/dL (ref 0.61–1.24)
GFR, Estimated: >60 mL/min (ref >60)
Glucose, Bld: 144 mg/dL — ABNORMAL HIGH (ref 70–99)
Potassium: 4 mmol/L (ref 3.5–5.1)
Sodium: 138 mmol/L (ref 135–145)
Total Bilirubin: 0.4 mg/dL (ref 0.0–1.2)
Total Protein: 6.1 g/dL — ABNORMAL LOW (ref 6.5–8.1)

## 2024-06-23 LAB — PROTIME-INR
INR: 1.1 (ref 0.8–1.2)
Prothrombin Time: 15.2 s (ref 11.4–15.2)

## 2024-06-23 MED ORDER — CALCIUM CARBONATE ANTACID 500 MG PO CHEW
1.0000 | CHEWABLE_TABLET | Freq: Three times a day (TID) | ORAL | Status: DC | PRN
  Administered 2024-06-23: 200 mg via ORAL
  Filled 2024-06-23: qty 1

## 2024-06-23 MED ORDER — DIPHENHYDRAMINE HCL 50 MG/ML IJ SOLN
25.0000 mg | Freq: Four times a day (QID) | INTRAMUSCULAR | Status: DC | PRN
  Administered 2024-06-23 – 2024-06-24 (×3): 25 mg via INTRAVENOUS
  Filled 2024-06-23 (×3): qty 1

## 2024-06-23 NOTE — ED Notes (Signed)
 Secretary called to [facility] Attending Dr. Ellouise Haber for allergic reaction. Xarelto  given at 0940. CB number given to MD.

## 2024-06-23 NOTE — ED Notes (Signed)
 Thrombectomy site bandage replaced Per attending. Site clean, dry and healed. No bleeding noted. This RN replaced dressing with a band-aid.

## 2024-06-23 NOTE — ED Notes (Signed)
 Checked on Pt post bendaryl administration and Pt states he feels tons better. Lip swelling has gone down and so has redness around armpits.

## 2024-06-23 NOTE — ED Notes (Signed)
 Per Attending: after discussion with pt and wife, we decided to try Xarelto  again to ascertain whether it's residual allergic reaction to eliquis , or truly to Xarelto . pls don't give anything else with this afternoon's Xarelto  dose, and then closely monitor for allergic rxn. if we confirm pt is allergic to Xarelto , then we have to consider less desirable alternative of anticoagulation. if pt goes up to the floor before Xarelto  is due, pls add floor RN to this message. thx

## 2024-06-23 NOTE — ED Notes (Signed)
Informed RN bed assigned 

## 2024-06-23 NOTE — Plan of Care (Addendum)
 Patient has known DVT in right leg, no SCDs placed.  Problem: Education: Goal: Knowledge of General Education information will improve Description: Including pain rating scale, medication(s)/side effects and non-pharmacologic comfort measures Outcome: Progressing   Problem: Health Behavior/Discharge Planning: Goal: Ability to manage health-related needs will improve Outcome: Progressing   Problem: Clinical Measurements: Goal: Ability to maintain clinical measurements within normal limits will improve Outcome: Progressing Goal: Will remain free from infection Outcome: Progressing Goal: Diagnostic test results will improve Outcome: Progressing Goal: Respiratory complications will improve Outcome: Progressing Goal: Cardiovascular complication will be avoided Outcome: Progressing   Problem: Activity: Goal: Risk for activity intolerance will decrease Outcome: Progressing   Problem: Nutrition: Goal: Adequate nutrition will be maintained Outcome: Progressing   Problem: Coping: Goal: Level of anxiety will decrease Outcome: Progressing   Problem: Elimination: Goal: Will not experience complications related to bowel motility Outcome: Progressing Goal: Will not experience complications related to urinary retention Outcome: Progressing   Problem: Pain Managment: Goal: General experience of comfort will improve and/or be controlled Outcome: Progressing   Problem: Safety: Goal: Ability to remain free from injury will improve Outcome: Progressing   Problem: Skin Integrity: Goal: Risk for impaired skin integrity will decrease Outcome: Progressing

## 2024-06-23 NOTE — Progress Notes (Signed)
  PROGRESS NOTE    Benjamin Ford  FMW:[id number] DOB: Feb 11, 1961 DOA: 06/22/2024 PCP: Valora Agent, MD  140A/140A-BB  LOS: 0 days   Brief hospital course:   Assessment & Plan: Benjamin Ford is a pleasant 63 y.o. male with medical history significant for HTN, asthma, GERD, DVT/PE, BPH, anxiety who presented to ED complaining of allergic reaction and swelling of lip.  Patient stated that he underwent thrombectomy for pulmonary embolism in the hospital last night 06/21/2024, was discharged home this morning around 9:30 AM and took his first dose of Eliquis  just prior to discharge.  He states that about 15 minutes later he began to have some tingling and swelling across his both sides of the upper lip.  The swelling slightly progressed and he received Benadryl  and Pepcid  at home.  Still he had swelling progressed and decided to come to the emergency room for evaluation.    1.  Allergic reaction/angioedema - He was given Solu-Medrol  in the emergency room.  His symptoms improved. --Eliquis  and olmesartan d/c'ed. --cont IV Pepcid .     2.  Recent DVT/PE - There was a concern about angioedema from Eliquis .  Pharmacy advised for Xarelto  and patient tolerated in the past. --pt reported some new symptoms with morning Xarelto , but willing to try again this afternoon to see if he was truly allergic to Xarelto  too. --monitor for allergic rxn with afternoon dose of Xarelto .   2.  HTN - Blood pressure is stable - Hold off olmesartan due to concern for angioedema --monitor for now  Hyperglycemia --likely due to steroid   DVT prophylaxis: On:Xarelto   Code Status: Full code  Family Communication: wife updated at bedside today Level of care: Med-Surg Dispo:   The patient is from: home Anticipated d/c is to: home Anticipated d/c date is: tomorrow   Subjective and Interval History:  Pt and wife reported new reactions after taking Xarelto  this morning, some bumps on the left side of the  tongue and rash under both axilla.  After discussion, pt and wife were agreeable to trying Xarelto  again this afternoon under close monitoring.   Objective: Vitals:   06/23/24 1131 06/23/24 1500 06/23/24 1628 06/23/24 1648  BP: (!) 149/78 134/78 (!) 151/91 (!) 141/84  Pulse: 64 82 70 65  Resp: 17 (!) 21 18 16   Temp: 98.2 F (36.8 C)  98.2 F (36.8 C) 97.7 F (36.5 C)  TempSrc:      SpO2: 99% 96% 99% 100%  Weight:      Height:        Intake/Output Summary (Last 24 hours) at 06/23/2024 1823 Last data filed at 06/23/2024 1009 Gross per 24 hour  Intake 100 ml  Output --  Net 100 ml   Filed Weights   06/22/24 1148  Weight: 74.8 kg    Examination:   Constitutional: NAD, AAOx3 HEENT: conjunctivae and lids normal, EOMI CV: No cyanosis.   RESP: normal respiratory effort, on RA Neuro: II - XII grossly intact.   Psych: Normal mood and affect.  Appropriate judgement and reason   Data Reviewed: I have personally reviewed labs and imaging studies  Time spent: 50 minutes  Ellouise Haber, MD [facility] If 7PM-7AM, please contact night-coverage 06/23/2024, 6:23 PM

## 2024-06-24 DIAGNOSIS — T7840XA Allergy, unspecified, initial encounter: Secondary | ICD-10-CM | POA: Diagnosis present

## 2024-06-24 DIAGNOSIS — T783XXA Angioneurotic edema, initial encounter: Secondary | ICD-10-CM | POA: Diagnosis not present

## 2024-06-24 LAB — CBC
HCT: 39.1 % (ref 39.0–52.0)
Hemoglobin: 13.3 g/dL (ref 13.0–17.0)
MCH: 32.8 pg (ref 26.0–34.0)
MCHC: 34 g/dL (ref 30.0–36.0)
MCV: 96.3 fL (ref 80.0–100.0)
Platelets: 210 K/uL (ref 150–400)
RBC: 4.06 MIL/uL — ABNORMAL LOW (ref 4.22–5.81)
RDW: 13.4 % (ref 11.5–15.5)
WBC: 8.9 K/uL (ref 4.0–10.5)
nRBC: 0 % (ref 0.0–0.2)

## 2024-06-24 LAB — APTT
aPTT: 45 s — ABNORMAL HIGH (ref 24–36)
aPTT: 49 s — ABNORMAL HIGH (ref 24–36)

## 2024-06-24 MED ORDER — DIPHENHYDRAMINE HCL 25 MG PO CAPS: 25.0000 mg | ORAL_CAPSULE | Freq: Two times a day (BID) | ORAL | Status: DC | PRN

## 2024-06-24 MED ORDER — NON FORMULARY: 1.0000 | Freq: Every day | Status: DC

## 2024-06-24 MED ORDER — HEPARIN BOLUS VIA INFUSION
2000.0000 [IU] | Freq: Once | INTRAVENOUS | Status: AC
  Administered 2024-06-24: 2000 [IU] via INTRAVENOUS
  Filled 2024-06-24: qty 2000

## 2024-06-24 MED ORDER — HEPARIN (PORCINE) 25000 UT/250ML-% IV SOLN
1600.0000 [IU]/h | INTRAVENOUS | Status: DC
  Administered 2024-06-24: 1150 [IU]/h via INTRAVENOUS
  Administered 2024-06-25: 1600 [IU]/h via INTRAVENOUS
  Filled 2024-06-24 (×2): qty 250

## 2024-06-24 MED ORDER — HEPARIN BOLUS VIA INFUSION
2200.0000 [IU] | Freq: Once | INTRAVENOUS | Status: AC
  Administered 2024-06-24: 2200 [IU] via INTRAVENOUS
  Filled 2024-06-24: qty 2200

## 2024-06-24 MED ORDER — DIPHENHYDRAMINE HCL 50 MG/ML IJ SOLN
25.0000 mg | Freq: Four times a day (QID) | INTRAMUSCULAR | Status: DC | PRN
  Administered 2024-06-24 – 2024-06-26 (×4): 25 mg via INTRAVENOUS
  Filled 2024-06-24 (×5): qty 1

## 2024-06-24 MED ORDER — CETIRIZINE HCL 10 MG PO TABS
10.0000 mg | ORAL_TABLET | Freq: Every day | ORAL | Status: DC
  Administered 2024-06-24 – 2024-06-27 (×4): 10 mg via ORAL
  Filled 2024-06-24 (×4): qty 1

## 2024-06-24 MED ORDER — DIPHENHYDRAMINE HCL 50 MG/ML IJ SOLN: 25.0000 mg | Freq: Two times a day (BID) | INTRAMUSCULAR | Status: DC | PRN

## 2024-06-24 NOTE — Progress Notes (Signed)
 Patient wife is bring in zysol from home for the patient.Please have medication label from pharmacy once received.

## 2024-06-24 NOTE — Consult Note (Signed)
 PHARMACY - ANTICOAGULATION CONSULT NOTE  Pharmacy Consult for Rivaroxaban  Indication: pulmonary embolus  Allergies  Allergen Reactions   Apixaban  Swelling    Caused lip swelling. Has taken xarelto  in the past with no complications.   Losartan Swelling    Patient Measurements: Height: 5' 6 (167.6 cm) Weight: 74.8 kg (165 lb) IBW/kg (Calculated) : 63.8 HEPARIN  DW (KG): 74.8  Vital Signs: Temp: 98.4 F (36.9 C) (07/19 2042) Temp Source: Oral (07/19 2042) BP: 137/87 (07/19 2042) Pulse Rate: 66 (07/19 2042)  Labs: Recent Labs    06/22/24 0134 06/22/24 1515 06/23/24 0503 06/24/24 0923 06/24/24 1442 06/24/24 2140  HGB 13.3 13.3 13.3 13.3  --   --   HCT 38.3* 40.4 39.5 39.1  --   --   PLT 188 205 232 210  --   --   APTT  --   --   --   --  45* 49*  LABPROT  --   --  15.2  --   --   --   INR  --   --  1.1  --   --   --   HEPARINUNFRC 0.40  --   --   --   --   --   CREATININE 0.78 0.74 0.82  --   --   --     Estimated Creatinine Clearance: 82.1 mL/min (by C-G formula based on SCr of 0.82 mg/dL).   Medical History: Past Medical History:  Diagnosis Date   Anxiety    Asthma    BPH (benign prostatic hyperplasia)    GERD (gastroesophageal reflux disease)    HTN (hypertension)    Insomnia    Palpitations    Stomach ulcer    Testicular mass     Medications:  Medications Prior to Admission  Medication Sig Dispense Refill Last Dose/Taking   albuterol  (VENTOLIN  HFA) 108 (90 Base) MCG/ACT inhaler Inhale 2 puffs into the lungs every 6 (six) hours as needed for wheezing or shortness of breath. 8 g 2 Unknown   budesonide -formoterol  (SYMBICORT ) 80-4.5 MCG/ACT inhaler Inhale 2 puffs into the lungs 2 (two) times daily.   Past Week   triamcinolone  cream (KENALOG ) 0.5 % Apply 1 Application topically 2 (two) times daily.   Past Week   [DISCONTINUED] olmesartan (BENICAR) 40 MG tablet Take 40 mg by mouth daily.      Scheduled:   cetirizine   10 mg Oral Daily   heparin   2,000  Units Intravenous Once   Infusions:   heparin  1,350 Units/hr (06/24/24 1632)   PRN: acetaminophen  **OR** acetaminophen , albuterol , calcium  carbonate, diphenhydrAMINE , ondansetron  **OR** ondansetron  (ZOFRAN ) IV, polyethylene glycol Anti-infectives (From admission, onward)    None       Assessment: 63 yr male with PMH: HTN, asthma, GERD, DVT/PE, BPH, presented to the ED with allergic reaction and swelling of lip. Patient stated that he underwent thrombectomy for pulmonary embolism in the hospital last night 06/21/2024, was discharged home this morning around 9:30 AM and took his first dose of Eliquis  just prior to discharge. He states that about 15 minutes later he began to have some tingling and swelling across his both sides of the upper lip. Apixaban  was switched to rivaroxaban , now switching to heparin  infusion. Will use aPTT to monitor heparin  due to false elevation of heparin  level with rivaroxaban  dose (last dose 7/18 1816)  Goal of Therapy:  Heparin  level 0.3-0.7 units/ml once heparin  level and aPTT correlate.  aPTT 66-102 seconds Monitor platelets by anticoagulation protocol: Yes  Plan:  7/19:  aPTT @ 2140 = 49, SUBtherapeutic - will order heparin  2000 units IV X 1 bolus and increase drip rate to 1600 units/hr - recheck aPTT and HL 6 hrs after rate change - CBC daily   Lola Lofaro D, PharmD 06/24/2024,10:19 PM

## 2024-06-24 NOTE — Consult Note (Addendum)
 PHARMACY - ANTICOAGULATION CONSULT NOTE  Pharmacy Consult for Rivaroxaban  Indication: pulmonary embolus  Allergies  Allergen Reactions   Apixaban  Swelling    Caused lip swelling. Has taken xarelto  in the past with no complications.   Losartan Swelling    Patient Measurements: Height: 5' 6 (167.6 cm) Weight: 74.8 kg (165 lb) IBW/kg (Calculated) : 63.8 HEPARIN  DW (KG): 74.8  Vital Signs: Temp: 98.4 F (36.9 C) (07/19 1519) Temp Source: Oral (07/19 0726) BP: 131/85 (07/19 1519) Pulse Rate: 68 (07/19 1519)  Labs: Recent Labs    06/22/24 0134 06/22/24 1515 06/23/24 0503 06/24/24 0923 06/24/24 1442  HGB 13.3 13.3 13.3 13.3  --   HCT 38.3* 40.4 39.5 39.1  --   PLT 188 205 232 210  --   APTT  --   --   --   --  45*  LABPROT  --   --  15.2  --   --   INR  --   --  1.1  --   --   HEPARINUNFRC 0.40  --   --   --   --   CREATININE 0.78 0.74 0.82  --   --     Estimated Creatinine Clearance: 82.1 mL/min (by C-G formula based on SCr of 0.82 mg/dL).   Medical History: Past Medical History:  Diagnosis Date   Anxiety    Asthma    BPH (benign prostatic hyperplasia)    GERD (gastroesophageal reflux disease)    HTN (hypertension)    Insomnia    Palpitations    Stomach ulcer    Testicular mass     Medications:  Medications Prior to Admission  Medication Sig Dispense Refill Last Dose/Taking   albuterol  (VENTOLIN  HFA) 108 (90 Base) MCG/ACT inhaler Inhale 2 puffs into the lungs every 6 (six) hours as needed for wheezing or shortness of breath. 8 g 2 Unknown   budesonide -formoterol  (SYMBICORT ) 80-4.5 MCG/ACT inhaler Inhale 2 puffs into the lungs 2 (two) times daily.   Past Week   triamcinolone  cream (KENALOG ) 0.5 % Apply 1 Application topically 2 (two) times daily.   Past Week   [DISCONTINUED] olmesartan (BENICAR) 40 MG tablet Take 40 mg by mouth daily.      Scheduled:  Infusions:   heparin  1,150 Units/hr (06/24/24 0945)   PRN: acetaminophen  **OR** acetaminophen ,  albuterol , calcium  carbonate, ondansetron  **OR** ondansetron  (ZOFRAN ) IV, polyethylene glycol Anti-infectives (From admission, onward)    None       Assessment: 63 yr male with PMH: HTN, asthma, GERD, DVT/PE, BPH, presented to the ED with allergic reaction and swelling of lip. Patient stated that he underwent thrombectomy for pulmonary embolism in the hospital last night 06/21/2024, was discharged home this morning around 9:30 AM and took his first dose of Eliquis  just prior to discharge. He states that about 15 minutes later he began to have some tingling and swelling across his both sides of the upper lip. Apixaban  was switched to rivaroxaban , now switching to heparin  infusion. Will use aPTT to monitor heparin  due to false elevation of heparin  level with rivaroxaban  dose (last dose 7/18 1816)  7/19 1442 aPTT 45.   Goal of Therapy:  Heparin  level 0.3-0.7 units/ml once heparin  level and aPTT correlate.  aPTT 66-102 seconds Monitor platelets by anticoagulation protocol: Yes   Plan:  aPTT is subtherapetuic. Will give heparin  bolus of 2200 units x 1 and increase the heparin  infusion to 1350 units/hr. Recheck aPTT in 6 hours. Heparin  level and CBC with AM labs. Switch  to heparin  level monitoring once aPTT and heparin  level correlate.   Cathaleen GORMAN Blanch, PharmD, BCPS 06/24/2024,3:35 PM

## 2024-06-24 NOTE — Progress Notes (Signed)
 After administering Benadryl  at 0001. The rash and itching seemed to have  subsided and red raised bumps went away and patient reported that he was feeling better. However, at [phone] while rounding on this patient, patient now has red raised bumps again on the anterior and posterior of the torso, bilateral arms and wrist as well as under arms. The rash seem to now have spread to the bilateral upper thigh and buttock. This nurse administered a dose of PRN Benadryl  per provider's order. Care plan continues.

## 2024-06-24 NOTE — Consult Note (Signed)
 PHARMACY - ANTICOAGULATION CONSULT NOTE  Pharmacy Consult for Rivaroxaban  Indication: pulmonary embolus  Allergies  Allergen Reactions   Apixaban  Swelling    Caused lip swelling. Has taken xarelto  in the past with no complications.   Losartan Swelling    Patient Measurements: Height: 5' 6 (167.6 cm) Weight: 74.8 kg (165 lb) IBW/kg (Calculated) : 63.8 HEPARIN  DW (KG): 74.8  Vital Signs: Temp: 97.8 F (36.6 C) (07/19 0726) Temp Source: Oral (07/19 0726) BP: 151/87 (07/19 0726) Pulse Rate: 65 (07/19 0726)  Labs: Recent Labs    06/22/24 0134 06/22/24 1515 06/23/24 0503  HGB 13.3 13.3 13.3  HCT 38.3* 40.4 39.5  PLT 188 205 232  LABPROT  --   --  15.2  INR  --   --  1.1  HEPARINUNFRC 0.40  --   --   CREATININE 0.78 0.74 0.82    Estimated Creatinine Clearance: 82.1 mL/min (by C-G formula based on SCr of 0.82 mg/dL).   Medical History: Past Medical History:  Diagnosis Date   Anxiety    Asthma    BPH (benign prostatic hyperplasia)    GERD (gastroesophageal reflux disease)    HTN (hypertension)    Insomnia    Palpitations    Stomach ulcer    Testicular mass     Medications:  Medications Prior to Admission  Medication Sig Dispense Refill Last Dose/Taking   albuterol  (VENTOLIN  HFA) 108 (90 Base) MCG/ACT inhaler Inhale 2 puffs into the lungs every 6 (six) hours as needed for wheezing or shortness of breath. 8 g 2 Unknown   budesonide -formoterol  (SYMBICORT ) 80-4.5 MCG/ACT inhaler Inhale 2 puffs into the lungs 2 (two) times daily.   Past Week   triamcinolone  cream (KENALOG ) 0.5 % Apply 1 Application topically 2 (two) times daily.   Past Week   [DISCONTINUED] olmesartan (BENICAR) 40 MG tablet Take 40 mg by mouth daily.      Scheduled:  Infusions:  PRN: acetaminophen  **OR** acetaminophen , albuterol , calcium  carbonate, diphenhydrAMINE , ondansetron  **OR** ondansetron  (ZOFRAN ) IV, polyethylene glycol Anti-infectives (From admission, onward)    None        Assessment: 63 yr male with PMH: HTN, asthma, GERD, DVT/PE, BPH, presented to the ED with allergic reaction and swelling of lip. Patient stated that he underwent thrombectomy for pulmonary embolism in the hospital last night 06/21/2024, was discharged home this morning around 9:30 AM and took his first dose of Eliquis  just prior to discharge. He states that about 15 minutes later he began to have some tingling and swelling across his both sides of the upper lip. Apixaban  was switched to rivaroxaban , now switching to heparin  infusion. Will use aPTT to monitor heparin  due to false elevation of heparin  level with rivaroxaban  dose (last dose 7/18 1816)  Goal of Therapy:  Heparin  level 0.3-0.7 units/ml once heparin  level and aPTT correlate.  aPTT 66-102 seconds Monitor platelets by anticoagulation protocol: Yes   Plan:  Give 2000 units bolus x 1 Start heparin  infusion at 1150 units/hr Check anti-Xa level in 6 hours and daily while on heparin  Continue to monitor H&H and platelets  Cathaleen GORMAN Blanch, PharmD, BCPS 06/24/2024,8:41 AM

## 2024-06-24 NOTE — Plan of Care (Signed)

## 2024-06-24 NOTE — Progress Notes (Signed)
  PROGRESS NOTE    Benjamin Ford  FMW:[id number] DOB: 01-06-1961 DOA: 06/22/2024 PCP: Valora Agent, MD  140A/140A-BB  LOS: 0 days   Brief hospital course:   Assessment & Plan: Benjamin Ford is a pleasant 63 y.o. male with medical history significant for HTN, asthma, GERD, DVT/PE, BPH, anxiety who presented to ED complaining of allergic reaction and swelling of lip.  Patient stated that he underwent thrombectomy for pulmonary embolism in the hospital last night 06/21/2024, was discharged home this morning around 9:30 AM and took his first dose of Eliquis  just prior to discharge.  He states that about 15 minutes later he began to have some tingling and swelling across his both sides of the upper lip.  The swelling slightly progressed and he received Benadryl  and Pepcid  at home.  Still he had swelling progressed and decided to come to the emergency room for evaluation.    1.  Allergic reaction Angioedema - He was given Solu-Medrol  in the emergency room.  His symptoms improved.  However, rash and welts started to appear in various parts of his body, which resolved quickly with IV benadryl .  After long discussion, wife said pt has bene taking Xyzal  since about 2020 up until recent admission on 06/20/24.  Possible re-bound effect from suddenly stopping anti-histamine? --family asked to bring in Xyzal  for pt to resume in the hospital --benadryl  PRN for skin rash --hold further steroid and Pepcid  unless pt has angioedema.     2.  Recent DVT/PE - There was a concern about angioedema from Eliquis .  Pharmacy advised for Xarelto  and patient tolerated in the past. --pt started having rash and welts after starting Xarelto , however, can be re-bound reaction (see above). --keep on heparin  gtt for now    2.  HTN - Blood pressure is stable - Hold off olmesartan due to concern for angioedema --monitor for now  Hyperglycemia --likely due to steroid --no need for SSI   DVT prophylaxis: On:heparin   gtt Code Status: Full code  Family Communication: wife updated at bedside today Level of care: Med-Surg Dispo:   The patient is from: home Anticipated d/c is to: home Anticipated d/c date is: 2-3 days   Subjective and Interval History:  Pt had rash and welts again overnight, which resolved with benadryl .   Objective: Vitals:   06/23/24 2208 06/24/24 0352 06/24/24 0726 06/24/24 1519  BP: (!) 146/86 127/80 (!) 151/87 131/85  Pulse: 70 62 65 68  Resp: 18 18 17 17   Temp: 97.8 F (36.6 C) 98.1 F (36.7 C) 97.8 F (36.6 C) 98.4 F (36.9 C)  TempSrc: Oral  Oral   SpO2: 98% 98% 100% 100%  Weight:      Height:       No intake or output data in the 24 hours ending 06/24/24 1743  Filed Weights   06/22/24 1148  Weight: 74.8 kg    Examination:   Constitutional: NAD, AAOx3 HEENT: conjunctivae and lids normal, EOMI CV: No cyanosis.   RESP: normal respiratory effort, on RA Neuro: II - XII grossly intact.   Psych: Normal mood and affect.  Appropriate judgement and reason   Data Reviewed: I have personally reviewed labs and imaging studies  Time spent: 50 minutes  Ellouise Haber, MD [facility] If 7PM-7AM, please contact night-coverage 06/24/2024, 5:43 PM

## 2024-06-24 NOTE — Plan of Care (Signed)

## 2024-06-25 DIAGNOSIS — T783XXA Angioneurotic edema, initial encounter: Secondary | ICD-10-CM | POA: Diagnosis not present

## 2024-06-25 LAB — CBC
HCT: 36.5 % — ABNORMAL LOW (ref 39.0–52.0)
Hemoglobin: 12.6 g/dL — ABNORMAL LOW (ref 13.0–17.0)
MCH: 32.9 pg (ref 26.0–34.0)
MCHC: 34.5 g/dL (ref 30.0–36.0)
MCV: 95.3 fL (ref 80.0–100.0)
Platelets: 204 K/uL (ref 150–400)
RBC: 3.83 MIL/uL — ABNORMAL LOW (ref 4.22–5.81)
RDW: 13.2 % (ref 11.5–15.5)
WBC: 7.8 K/uL (ref 4.0–10.5)
nRBC: 0 % (ref 0.0–0.2)

## 2024-06-25 LAB — APTT
aPTT: 148 s — ABNORMAL HIGH (ref 24–36)
aPTT: 77 s — ABNORMAL HIGH (ref 24–36)
aPTT: 68 s — ABNORMAL HIGH (ref 24–36)

## 2024-06-25 LAB — HEPARIN LEVEL (UNFRACTIONATED): Heparin Unfractionated: 1.08 [IU]/mL — ABNORMAL HIGH (ref 0.30–0.70)

## 2024-06-25 MED ORDER — HEPARIN (PORCINE) 25000 UT/250ML-% IV SOLN
1200.0000 [IU]/h | INTRAVENOUS | Status: AC
  Administered 2024-06-25 (×2): 1350 [IU]/h via INTRAVENOUS
  Administered 2024-06-26: 1200 [IU]/h via INTRAVENOUS
  Filled 2024-06-25 (×2): qty 250

## 2024-06-25 MED ORDER — HYDRALAZINE HCL 20 MG/ML IJ SOLN: 10.0000 mg | Freq: Four times a day (QID) | INTRAMUSCULAR | Status: DC | PRN

## 2024-06-25 NOTE — Consult Note (Signed)
 PHARMACY - ANTICOAGULATION CONSULT NOTE  Pharmacy Consult for Heparin  Infusion Indication: pulmonary embolus  Allergies  Allergen Reactions   Apixaban  Swelling    Caused lip swelling. Has taken xarelto  in the past with no complications.   Losartan Swelling    Patient Measurements: Height: 5' 6 (167.6 cm) Weight: 74.8 kg (165 lb) IBW/kg (Calculated) : 63.8 HEPARIN  DW (KG): 74.8  Vital Signs: Temp: 98.2 F (36.8 C) (07/20 0726) Temp Source: Oral (07/20 0726) BP: 149/79 (07/20 0726) Pulse Rate: 64 (07/20 0726)  Labs: Recent Labs    06/22/24 1515 06/23/24 0503 06/24/24 0923 06/24/24 1442 06/24/24 2140 06/25/24 0546 06/25/24 1415  HGB 13.3 13.3 13.3  --   --  12.6*  --   HCT 40.4 39.5 39.1  --   --  36.5*  --   PLT 205 232 210  --   --  204  --   APTT  --   --   --    < > 49* 148* 77*  LABPROT  --  15.2  --   --   --   --   --   INR  --  1.1  --   --   --   --   --   HEPARINUNFRC  --   --   --   --   --  1.08*  --   CREATININE 0.74 0.82  --   --   --   --   --    < > = values in this interval not displayed.    Estimated Creatinine Clearance: 82.1 mL/min (by C-G formula based on SCr of 0.82 mg/dL).   Medical History: Past Medical History:  Diagnosis Date   Anxiety    Asthma    BPH (benign prostatic hyperplasia)    GERD (gastroesophageal reflux disease)    HTN (hypertension)    Insomnia    Palpitations    Stomach ulcer    Testicular mass     Medications:  Medications Prior to Admission  Medication Sig Dispense Refill Last Dose/Taking   albuterol  (VENTOLIN  HFA) 108 (90 Base) MCG/ACT inhaler Inhale 2 puffs into the lungs every 6 (six) hours as needed for wheezing or shortness of breath. 8 g 2 Unknown   budesonide -formoterol  (SYMBICORT ) 80-4.5 MCG/ACT inhaler Inhale 2 puffs into the lungs 2 (two) times daily.   Past Week   triamcinolone  cream (KENALOG ) 0.5 % Apply 1 Application topically 2 (two) times daily.   Past Week   [DISCONTINUED] olmesartan (BENICAR)  40 MG tablet Take 40 mg by mouth daily.      Scheduled:   cetirizine   10 mg Oral Daily   Infusions:   heparin  1,350 Units/hr (06/25/24 0823)   PRN: acetaminophen  **OR** acetaminophen , albuterol , calcium  carbonate, diphenhydrAMINE , ondansetron  **OR** ondansetron  (ZOFRAN ) IV, polyethylene glycol Anti-infectives (From admission, onward)    None       Assessment: 63 yr male with PMH: HTN, asthma, GERD, DVT/PE, BPH, presented to the ED with allergic reaction and swelling of lip. Patient stated that he underwent thrombectomy for pulmonary embolism in the hospital last night 06/21/2024, was discharged home this morning around 9:30 AM and took his first dose of Eliquis  just prior to discharge. He states that about 15 minutes later he began to have some tingling and swelling across his both sides of the upper lip. Apixaban  was switched to rivaroxaban , now switching to heparin  infusion. Will use aPTT to monitor heparin  due to false elevation of heparin  level with rivaroxaban   dose (last dose 7/18 1816)  Goal of Therapy:  Heparin  level 0.3-0.7 units/ml once heparin  level and aPTT correlate.  aPTT 66-102 seconds Monitor platelets by anticoagulation protocol: Yes   Plan:  7/20 @ 1415:  aPTT = 77 - aPTT is therapeutic x 1 -  will continue heparin  drip @ 1350 units/hr - recheck aPTT in 6 hrs for confirmation - recheck HL on 7/21 with AM labs - CBC daily   Olam Fritter, PharmD, BCPS 06/25/2024 2:55 PM

## 2024-06-25 NOTE — Plan of Care (Signed)
  Problem: Education: Goal: Knowledge of General Education information will improve Description: Including pain rating scale, medication(s)/side effects and non-pharmacologic comfort measures Outcome: Progressing   Problem: Nutrition: Goal: Adequate nutrition will be maintained Outcome: Progressing   Problem: Activity: Goal: Risk for activity intolerance will decrease Outcome: Progressing   Problem: Nutrition: Goal: Adequate nutrition will be maintained Outcome: Progressing   Problem: Coping: Goal: Level of anxiety will decrease Outcome: Progressing

## 2024-06-25 NOTE — Plan of Care (Signed)

## 2024-06-25 NOTE — Progress Notes (Signed)
 PROGRESS NOTE    Benjamin Ford  FMW:[id number] DOB: [birth year] DOA: 06/22/2024 PCP: Valora Agent, MD  131A/131A-AA  LOS: 1 day   Brief hospital course:   Assessment & Plan: Benjamin Ford is a pleasant 63 y.o. male with medical history significant for HTN, asthma, GERD, DVT/PE, BPH, anxiety who presented to ED complaining of allergic reaction and swelling of lip.  Patient stated that he underwent thrombectomy for pulmonary embolism in the hospital last night 06/21/2024, was discharged home this morning around 9:30 AM and took his first dose of Eliquis  just prior to discharge.  He states that about 15 minutes later he began to have some tingling and swelling across his both sides of the upper lip.  The swelling slightly progressed and he received Benadryl  and Pepcid  at home.  Still he had swelling progressed and decided to come to the emergency room for evaluation.    1.  Allergic reaction Angioedema - He was given Solu-Medrol  in the emergency room.  His symptoms improved.  However, rash and welts started to appear in various parts of his body, which resolved quickly with IV benadryl .  After long discussion, wife said pt has bene taking Xyzal  since about 2020 up until recent admission on 06/20/24.  Possible re-bound effect from suddenly stopping anti-histamine? --started Zyrtec  (same active ingredient as Xyzal ). --benadryl  PRN for skin rash --hold further steroid and Pepcid  unless pt has angioedema.   --if pt stops having itchy rash flare ups after resuming home cetirizine , then allergic reaction is not likely due to new mediation, but re-bound effect from stopping anti-histamine.   2.  Recent DVT/PE - There was a concern about angioedema from Eliquis .  Pharmacy advised for Xarelto  and patient tolerated in the past. --pt started having rash and welts after starting Xarelto , however, can be re-bound reaction (see above).  Pt continued to have flare ups of rash and welts while on heparin   gtt. --cont heparin  gtt for now.  When no more rash flare up for 1 day, will try giving Xarelto  again.   2.  HTN - Blood pressure is stable - Hold off olmesartan due to concern for angioedema --monitor for now.  IV hydralazine  PRN.  Will not start any new medication right now.  Hyperglycemia --likely due to steroid --no need for SSI   DVT prophylaxis: On:heparin  gtt Code Status: Full code  Family Communication: wife updated at bedside today Level of care: Med-Surg Dispo:   The patient is from: home Anticipated d/c is to: home Anticipated d/c date is: 2-3 days   Subjective and Interval History:  Had 2 flares of itchy rash overnight, resolved with IV benadryl .  Pt reported feeling better today.   Objective: Vitals:   06/24/24 2042 06/25/24 0403 06/25/24 0726 06/25/24 1529  BP: 137/87 136/79 (!) 149/79 (!) 158/82  Pulse: 66 (!) 57 64 64  Resp: 18 18 17 17   Temp: 98.4 F (36.9 C) 98.7 F (37.1 C) 98.2 F (36.8 C) 98 F (36.7 C)  TempSrc: Oral  Oral   SpO2: 100% 100% 97% 100%  Weight:      Height:        Intake/Output Summary (Last 24 hours) at 06/25/2024 1640 Last data filed at 06/25/2024 1422 Gross per 24 hour  Intake 1240.39 ml  Output --  Net 1240.39 ml    Filed Weights   06/22/24 1148  Weight: 74.8 kg    Examination:   Constitutional: NAD, AAOx3 HEENT: conjunctivae and lids normal, EOMI CV:  No cyanosis.   RESP: normal respiratory effort, on RA Neuro: II - XII grossly intact.   Psych: Normal mood and affect.  Appropriate judgement and reason   Data Reviewed: I have personally reviewed labs and imaging studies  Time spent: 35 minutes  Ellouise Haber, MD [facility] If 7PM-7AM, please contact night-coverage 06/25/2024, 4:40 PM

## 2024-06-25 NOTE — Consult Note (Signed)
 PHARMACY - ANTICOAGULATION CONSULT NOTE  Pharmacy Consult for Rivaroxaban  Indication: pulmonary embolus  Allergies  Allergen Reactions   Apixaban  Swelling    Caused lip swelling. Has taken xarelto  in the past with no complications.   Losartan Swelling    Patient Measurements: Height: 5' 6 (167.6 cm) Weight: 74.8 kg (165 lb) IBW/kg (Calculated) : 63.8 HEPARIN  DW (KG): 74.8  Vital Signs: Temp: 98.7 F (37.1 C) (07/20 0403) Temp Source: Oral (07/19 2042) BP: 136/79 (07/20 0403) Pulse Rate: 57 (07/20 0403)  Labs: Recent Labs    06/22/24 1515 06/23/24 0503 06/24/24 0923 06/24/24 1442 06/24/24 2140 06/25/24 0546  HGB 13.3 13.3 13.3  --   --  12.6*  HCT 40.4 39.5 39.1  --   --  36.5*  PLT 205 232 210  --   --  204  APTT  --   --   --  45* 49* 148*  LABPROT  --  15.2  --   --   --   --   INR  --  1.1  --   --   --   --   HEPARINUNFRC  --   --   --   --   --  1.08*  CREATININE 0.74 0.82  --   --   --   --     Estimated Creatinine Clearance: 82.1 mL/min (by C-G formula based on SCr of 0.82 mg/dL).   Medical History: Past Medical History:  Diagnosis Date   Anxiety    Asthma    BPH (benign prostatic hyperplasia)    GERD (gastroesophageal reflux disease)    HTN (hypertension)    Insomnia    Palpitations    Stomach ulcer    Testicular mass     Medications:  Medications Prior to Admission  Medication Sig Dispense Refill Last Dose/Taking   albuterol  (VENTOLIN  HFA) 108 (90 Base) MCG/ACT inhaler Inhale 2 puffs into the lungs every 6 (six) hours as needed for wheezing or shortness of breath. 8 g 2 Unknown   budesonide -formoterol  (SYMBICORT ) 80-4.5 MCG/ACT inhaler Inhale 2 puffs into the lungs 2 (two) times daily.   Past Week   triamcinolone  cream (KENALOG ) 0.5 % Apply 1 Application topically 2 (two) times daily.   Past Week   [DISCONTINUED] olmesartan (BENICAR) 40 MG tablet Take 40 mg by mouth daily.      Scheduled:   cetirizine   10 mg Oral Daily   Infusions:    heparin      PRN: acetaminophen  **OR** acetaminophen , albuterol , calcium  carbonate, diphenhydrAMINE , ondansetron  **OR** ondansetron  (ZOFRAN ) IV, polyethylene glycol Anti-infectives (From admission, onward)    None       Assessment: 63 yr male with PMH: HTN, asthma, GERD, DVT/PE, BPH, presented to the ED with allergic reaction and swelling of lip. Patient stated that he underwent thrombectomy for pulmonary embolism in the hospital last night 06/21/2024, was discharged home this morning around 9:30 AM and took his first dose of Eliquis  just prior to discharge. He states that about 15 minutes later he began to have some tingling and swelling across his both sides of the upper lip. Apixaban  was switched to rivaroxaban , now switching to heparin  infusion. Will use aPTT to monitor heparin  due to false elevation of heparin  level with rivaroxaban  dose (last dose 7/18 1816)  Goal of Therapy:  Heparin  level 0.3-0.7 units/ml once heparin  level and aPTT correlate.  aPTT 66-102 seconds Monitor platelets by anticoagulation protocol: Yes   Plan:  7/20 @ 0546:  aPTT = 148,  HL = 1.08 - aPTT elevated, HL still elevated from Eliquis  PTA - will hold heparin  drip for 1 hr and restart @ 1350 units/hr - recheck aPTT 6 hrs after restart - recheck HL on 7/21 with AM labs - CBC daily   Nhyla Nappi D, PharmD 06/25/2024,6:40 AM

## 2024-06-25 NOTE — Consult Note (Signed)
 PHARMACY - ANTICOAGULATION CONSULT NOTE  Pharmacy Consult for Heparin  Infusion Indication: pulmonary embolus  Allergies  Allergen Reactions   Apixaban  Swelling    Caused lip swelling. Has taken xarelto  in the past with no complications.   Losartan Swelling    Patient Measurements: Height: 5' 6 (167.6 cm) Weight: 74.8 kg (165 lb) IBW/kg (Calculated) : 63.8 HEPARIN  DW (KG): 74.8  Vital Signs: Temp: 98 F (36.7 C) (07/20 1529) BP: 158/82 (07/20 1529) Pulse Rate: 64 (07/20 1529)  Labs: Recent Labs    06/23/24 0503 06/24/24 0923 06/24/24 1442 06/25/24 0546 06/25/24 1415 06/25/24 1945  HGB 13.3 13.3  --  12.6*  --   --   HCT 39.5 39.1  --  36.5*  --   --   PLT 232 210  --  204  --   --   APTT  --   --    < > 148* 77* 68*  LABPROT 15.2  --   --   --   --   --   INR 1.1  --   --   --   --   --   HEPARINUNFRC  --   --   --  1.08*  --   --   CREATININE 0.82  --   --   --   --   --    < > = values in this interval not displayed.    Estimated Creatinine Clearance: 82.1 mL/min (by C-G formula based on SCr of 0.82 mg/dL).   Medical History: Past Medical History:  Diagnosis Date   Anxiety    Asthma    BPH (benign prostatic hyperplasia)    GERD (gastroesophageal reflux disease)    HTN (hypertension)    Insomnia    Palpitations    Stomach ulcer    Testicular mass     Medications:  Medications Prior to Admission  Medication Sig Dispense Refill Last Dose/Taking   albuterol  (VENTOLIN  HFA) 108 (90 Base) MCG/ACT inhaler Inhale 2 puffs into the lungs every 6 (six) hours as needed for wheezing or shortness of breath. 8 g 2 Unknown   budesonide -formoterol  (SYMBICORT ) 80-4.5 MCG/ACT inhaler Inhale 2 puffs into the lungs 2 (two) times daily.   Past Week   triamcinolone  cream (KENALOG ) 0.5 % Apply 1 Application topically 2 (two) times daily.   Past Week   [DISCONTINUED] olmesartan (BENICAR) 40 MG tablet Take 40 mg by mouth daily.      Scheduled:   cetirizine   10 mg Oral  Daily   Infusions:   heparin  1,350 Units/hr (06/25/24 0831)   PRN: acetaminophen  **OR** acetaminophen , albuterol , calcium  carbonate, diphenhydrAMINE , hydrALAZINE , ondansetron  **OR** ondansetron  (ZOFRAN ) IV, polyethylene glycol Anti-infectives (From admission, onward)    None       Assessment: 63 yr male with PMH: HTN, asthma, GERD, DVT/PE, BPH, presented to the ED with allergic reaction and swelling of lip. Patient stated that he underwent thrombectomy for pulmonary embolism in the hospital last night 06/21/2024, was discharged home this morning around 9:30 AM and took his first dose of Eliquis  just prior to discharge. He states that about 15 minutes later he began to have some tingling and swelling across his both sides of the upper lip. Apixaban  was switched to rivaroxaban , now switching to heparin  infusion. Will use aPTT to monitor heparin  due to false elevation of heparin  level with rivaroxaban  dose (last dose 7/18 1816)  Goal of Therapy:  Heparin  level 0.3-0.7 units/ml once heparin  level and aPTT correlate.  aPTT  66-102 seconds Monitor platelets by anticoagulation protocol: Yes   Plan:  7/20 @ 1945:  aPTT = 68 - aPTT is therapeutic x 2 -  will continue heparin  drip @ 1350 units/hr - recheck aPTT and HL on 7/21 with AM labs - CBC daily   Olam Fritter, PharmD, BCPS 06/25/2024 8:22 PM

## 2024-06-26 DIAGNOSIS — T783XXA Angioneurotic edema, initial encounter: Secondary | ICD-10-CM | POA: Diagnosis not present

## 2024-06-26 LAB — CBC
HCT: 36.6 % — ABNORMAL LOW (ref 39.0–52.0)
Hemoglobin: 12.4 g/dL — ABNORMAL LOW (ref 13.0–17.0)
MCH: 32.8 pg (ref 26.0–34.0)
MCHC: 33.9 g/dL (ref 30.0–36.0)
MCV: 96.8 fL (ref 80.0–100.0)
Platelets: 223 K/uL (ref 150–400)
RBC: 3.78 MIL/uL — ABNORMAL LOW (ref 4.22–5.81)
RDW: 13 % (ref 11.5–15.5)
WBC: 7.1 K/uL (ref 4.0–10.5)
nRBC: 0 % (ref 0.0–0.2)

## 2024-06-26 LAB — APTT
aPTT: 117 s — ABNORMAL HIGH (ref 24–36)
aPTT: 67 s — ABNORMAL HIGH (ref 24–36)
aPTT: 78 s — ABNORMAL HIGH (ref 24–36)

## 2024-06-26 LAB — HEPARIN LEVEL (UNFRACTIONATED): Heparin Unfractionated: 0.76 [IU]/mL — ABNORMAL HIGH (ref 0.30–0.70)

## 2024-06-26 NOTE — Plan of Care (Signed)
  Problem: Education: Goal: Knowledge of General Education information will improve Description: Including pain rating scale, medication(s)/side effects and non-pharmacologic comfort measures 06/26/2024 0731 by Dante Netter D, LPN Outcome: Progressing 06/26/2024 0731 by Dante Netter D, LPN Outcome: Progressing   Problem: Health Behavior/Discharge Planning: Goal: Ability to manage health-related needs will improve 06/26/2024 0731 by Dante Netter D, LPN Outcome: Progressing 06/26/2024 0731 by Dante Netter D, LPN Outcome: Progressing   Problem: Clinical Measurements: Goal: Ability to maintain clinical measurements within normal limits will improve 06/26/2024 0731 by Dante Netter D, LPN Outcome: Progressing 06/26/2024 0731 by Dante Netter D, LPN Outcome: Progressing Goal: Will remain free from infection 06/26/2024 0731 by Dante Netter D, LPN Outcome: Progressing 06/26/2024 0731 by Dante Netter D, LPN Outcome: Progressing Goal: Diagnostic test results will improve 06/26/2024 0731 by Dante Netter D, LPN Outcome: Progressing 06/26/2024 0731 by Dante Netter D, LPN Outcome: Progressing Goal: Respiratory complications will improve 06/26/2024 0731 by Dante Netter D, LPN Outcome: Progressing 06/26/2024 0731 by Dante Netter D, LPN Outcome: Progressing Goal: Cardiovascular complication will be avoided 06/26/2024 0731 by Dante Netter D, LPN Outcome: Progressing 06/26/2024 0731 by Dante Netter D, LPN Outcome: Progressing   Problem: Activity: Goal: Risk for activity intolerance will decrease 06/26/2024 0731 by Dante Netter D, LPN Outcome: Progressing 06/26/2024 0731 by Dante Netter D, LPN Outcome: Progressing   Problem: Nutrition: Goal: Adequate nutrition will be maintained 06/26/2024 0731 by Dante Netter D, LPN Outcome: Progressing 06/26/2024 0731 by Dante Netter D, LPN Outcome: Progressing   Problem: Coping: Goal: Level of anxiety will decrease 06/26/2024 0731 by Dante Netter D, LPN Outcome:  Progressing 06/26/2024 0731 by Dante Netter D, LPN Outcome: Progressing   Problem: Elimination: Goal: Will not experience complications related to bowel motility 06/26/2024 0731 by Dante Netter D, LPN Outcome: Progressing 06/26/2024 0731 by Dante Netter D, LPN Outcome: Progressing Goal: Will not experience complications related to urinary retention 06/26/2024 0731 by Dante Netter D, LPN Outcome: Progressing 06/26/2024 0731 by Dante Netter D, LPN Outcome: Progressing   Problem: Pain Managment: Goal: General experience of comfort will improve and/or be controlled 06/26/2024 0731 by Dante Netter D, LPN Outcome: Progressing 06/26/2024 0731 by Dante Netter D, LPN Outcome: Progressing   Problem: Safety: Goal: Ability to remain free from injury will improve 06/26/2024 0731 by Dante Netter D, LPN Outcome: Progressing 06/26/2024 0731 by Dante Netter D, LPN Outcome: Progressing   Problem: Skin Integrity: Goal: Risk for impaired skin integrity will decrease 06/26/2024 0731 by Dante Netter D, LPN Outcome: Progressing 06/26/2024 0731 by Dante Netter D, LPN Outcome: Progressing

## 2024-06-26 NOTE — Consult Note (Signed)
 PHARMACY - ANTICOAGULATION CONSULT NOTE  Pharmacy Consult for Heparin  Infusion Indication: pulmonary embolus  Allergies  Allergen Reactions   Apixaban  Swelling    Caused lip swelling. Has taken xarelto  in the past with no complications.   Losartan Swelling    Patient Measurements: Height: 5' 6 (167.6 cm) Weight: 74.8 kg (165 lb) IBW/kg (Calculated) : 63.8 HEPARIN  DW (KG): 74.8  Vital Signs: Temp: 97.7 F (36.5 C) (07/21 0244) Temp Source: Oral (07/20 2052) BP: 149/81 (07/21 0244) Pulse Rate: 57 (07/21 0244)  Labs: Recent Labs    06/24/24 0923 06/24/24 1442 06/25/24 0546 06/25/24 1415 06/25/24 1945 06/26/24 0617  HGB 13.3  --  12.6*  --   --  12.4*  HCT 39.1  --  36.5*  --   --  36.6*  PLT 210  --  204  --   --  223  APTT  --    < > 148* 77* 68* 117*  HEPARINUNFRC  --   --  1.08*  --   --  0.76*   < > = values in this interval not displayed.    Estimated Creatinine Clearance: 82.1 mL/min (by C-G formula based on SCr of 0.82 mg/dL).   Medical History: Past Medical History:  Diagnosis Date   Anxiety    Asthma    BPH (benign prostatic hyperplasia)    GERD (gastroesophageal reflux disease)    HTN (hypertension)    Insomnia    Palpitations    Stomach ulcer    Testicular mass     Medications:  Medications Prior to Admission  Medication Sig Dispense Refill Last Dose/Taking   albuterol  (VENTOLIN  HFA) 108 (90 Base) MCG/ACT inhaler Inhale 2 puffs into the lungs every 6 (six) hours as needed for wheezing or shortness of breath. 8 g 2 Unknown   budesonide -formoterol  (SYMBICORT ) 80-4.5 MCG/ACT inhaler Inhale 2 puffs into the lungs 2 (two) times daily.   Past Week   triamcinolone  cream (KENALOG ) 0.5 % Apply 1 Application topically 2 (two) times daily.   Past Week   [DISCONTINUED] olmesartan (BENICAR) 40 MG tablet Take 40 mg by mouth daily.      Scheduled:   cetirizine   10 mg Oral Daily   Infusions:   heparin  1,350 Units/hr (06/25/24 2224)   PRN:  acetaminophen  **OR** acetaminophen , albuterol , calcium  carbonate, diphenhydrAMINE , hydrALAZINE , ondansetron  **OR** ondansetron  (ZOFRAN ) IV, polyethylene glycol Anti-infectives (From admission, onward)    None       Assessment: 63 yr male with PMH: HTN, asthma, GERD, DVT/PE, BPH, presented to the ED with allergic reaction and swelling of lip. Patient stated that he underwent thrombectomy for pulmonary embolism in the hospital last night 06/21/2024, was discharged home this morning around 9:30 AM and took his first dose of Eliquis  just prior to discharge. He states that about 15 minutes later he began to have some tingling and swelling across his both sides of the upper lip. Apixaban  was switched to rivaroxaban , now switching to heparin  infusion. Will use aPTT to monitor heparin  due to false elevation of heparin  level with rivaroxaban  dose (last dose 7/18 1816)  Goal of Therapy:  Heparin  level 0.3-0.7 units/ml once heparin  level and aPTT correlate.  aPTT 66-102 seconds Monitor platelets by anticoagulation protocol: Yes   Plan: 7/21 @ 0617:   aPTT = 117,  HL = 0.76 - aPTT elevated, HL elevated from Xarelto  - will decrease heparin  drip rate to 1200 units/hr and recheck aPTT 6 hrs after rate change - recheck HL on 7/22 with  AM labs  - CBC daily   Malisha Mabey D 06/26/2024 6:52 AM

## 2024-06-26 NOTE — Plan of Care (Signed)

## 2024-06-26 NOTE — Consult Note (Signed)
 PHARMACY - ANTICOAGULATION CONSULT NOTE  Pharmacy Consult for Heparin  Infusion Indication: pulmonary embolus  Allergies  Allergen Reactions   Apixaban  Swelling    Caused lip swelling. Has taken xarelto  in the past with no complications.   Losartan Swelling   Patient Measurements: Height: 5' 6 (167.6 cm) Weight: 74.8 kg (165 lb) IBW/kg (Calculated) : 63.8 HEPARIN  DW (KG): 74.8  Vital Signs: Temp: 98 F (36.7 C) (07/21 1515) BP: 143/90 (07/21 1515) Pulse Rate: 65 (07/21 1515)  Labs: Recent Labs    06/24/24 0923 06/24/24 1442 06/25/24 0546 06/25/24 1415 06/25/24 1945 06/26/24 0617 06/26/24 1324  HGB 13.3  --  12.6*  --   --  12.4*  --   HCT 39.1  --  36.5*  --   --  36.6*  --   PLT 210  --  204  --   --  223  --   APTT  --    < > 148*   < > 68* 117* 67*  HEPARINUNFRC  --   --  1.08*  --   --  0.76*  --    < > = values in this interval not displayed.   Estimated Creatinine Clearance: 82.1 mL/min (by C-G formula based on SCr of 0.82 mg/dL).  Medical History: Past Medical History:  Diagnosis Date   Anxiety    Asthma    BPH (benign prostatic hyperplasia)    GERD (gastroesophageal reflux disease)    HTN (hypertension)    Insomnia    Palpitations    Stomach ulcer    Testicular mass    Medications:  Medications Prior to Admission  Medication Sig Dispense Refill Last Dose/Taking   albuterol  (VENTOLIN  HFA) 108 (90 Base) MCG/ACT inhaler Inhale 2 puffs into the lungs every 6 (six) hours as needed for wheezing or shortness of breath. 8 g 2 Unknown   budesonide -formoterol  (SYMBICORT ) 80-4.5 MCG/ACT inhaler Inhale 2 puffs into the lungs 2 (two) times daily.   Past Week   triamcinolone  cream (KENALOG ) 0.5 % Apply 1 Application topically 2 (two) times daily.   Past Week   [DISCONTINUED] olmesartan (BENICAR) 40 MG tablet Take 40 mg by mouth daily.      Scheduled:   cetirizine   10 mg Oral Daily   Infusions:   heparin  1,200 Units/hr (06/26/24 1746)   PRN: acetaminophen   **OR** acetaminophen , albuterol , calcium  carbonate, diphenhydrAMINE , hydrALAZINE , ondansetron  **OR** ondansetron  (ZOFRAN ) IV, polyethylene glycol Anti-infectives (From admission, onward)    None      Assessment: 63 yr male with PMH: HTN, asthma, GERD, DVT/PE, BPH, presented to the ED with allergic reaction and swelling of lip. Patient stated that he underwent thrombectomy for pulmonary embolism in the hospital last night 06/21/2024, was discharged home this morning around 9:30 AM and took his first dose of Eliquis  just prior to discharge. He states that about 15 minutes later he began to have some tingling and swelling across his both sides of the upper lip. Apixaban  was switched to rivaroxaban , now switching to heparin  infusion. Will use aPTT to monitor heparin  due to false elevation of heparin  level with rivaroxaban  dose (last dose 7/18 1816).   Goal of Therapy:  Heparin  level 0.3-0.7 units/ml once heparin  level and aPTT correlate.  aPTT 66-102 seconds Monitor platelets by anticoagulation protocol: Yes  Labs: 7/21 @ 0617:   aPTT 117,  HL = 0.76 7/21 @ 1324:  aPTT 67, therapeutic  7/21 @ 1956:   aPTT 78, therapeutic x 2  Plan: aPTT therapeutic, HL  not correlating  Continue heparin  infusion at current rate of 1200 units/hr  Recheck aPTT & HL with AM labs  Continue monitoring with aPTT levels until correlation with HL  Monitor CBC daily   Evonnie Nieves, PharmD Clinical Pharmacist  06/26/2024 8:03 PM

## 2024-06-26 NOTE — Progress Notes (Signed)
 PROGRESS NOTE    Benjamin Ford  FMW:[id number] DOB: 11-01-1961 DOA: 06/22/2024 PCP: Valora Agent, MD  131A/131A-AA  LOS: 2 days   Brief hospital course:   Assessment & Plan: [name] is a pleasant 63 y.o. male with medical history significant for HTN, asthma, GERD, DVT/PE, BPH, anxiety who presented to ED complaining of allergic reaction and swelling of lip.  Patient stated that he underwent thrombectomy for pulmonary embolism in the hospital last night 06/21/2024, was discharged home this morning around 9:30 AM and took his first dose of Eliquis  just prior to discharge.  He states that about 15 minutes later he began to have some tingling and swelling across his both sides of the upper lip.  The swelling slightly progressed and he received Benadryl  and Pepcid  at home.  Still he had swelling progressed and decided to come to the emergency room for evaluation.    1.  Allergic reaction Angioedema - He was given Solu-Medrol  in the emergency room.  His symptoms improved.  However, rash and welts started to appear in various parts of his body, which resolved quickly with IV benadryl .  After long discussion, wife said pt has bene taking Xyzal  since about 2020 up until recent admission on 06/20/24.  Possible re-bound effect from suddenly stopping anti-histamine? --started Zyrtec  (same active ingredient as Xyzal ) on 7/19 --cont Zyrtec  --benadryl  PRN for skin rash --hold further steroid and Pepcid  unless pt has angioedema.   --if pt stops having itchy rash flare ups after resuming home cetirizine , then allergic reaction is not likely due to new mediation, but re-bound effect from stopping anti-histamine.   2.  Recent DVT/PE - There was a concern about angioedema from Eliquis .  Pharmacy advised for Xarelto  and patient tolerated in the past. --pt started having rash and welts after starting Xarelto , however, can be re-bound reaction (see above).  Pt continued to have flare ups of rash and  welts while on heparin  gtt. --cont heparin  gtt for now.  When no more rash flare up for 1 day, will try giving Xarelto  again.   2.  HTN - Blood pressure is stable - Hold off olmesartan due to concern for angioedema --monitor for now.  IV hydralazine  PRN.  Will not start any new medication right now.  Hyperglycemia --likely due to steroid --no need for SSI   DVT prophylaxis: On:heparin  gtt Code Status: Full code  Family Communication:  Level of care: Med-Surg Dispo:   The patient is from: home Anticipated d/c is to: home Anticipated d/c date is: 2-3 days   Subjective and Interval History:  Had 1 rash flare today.   Objective: Vitals:   06/25/24 2052 06/26/24 0244 06/26/24 0757 06/26/24 1515  BP: (!) 148/86 (!) 149/81 133/86 (!) 143/90  Pulse: 69 (!) 57 63 65  Resp: 16 17 16 16   Temp: 97.9 F (36.6 C) 97.7 F (36.5 C) 98.5 F (36.9 C) 98 F (36.7 C)  TempSrc: Oral     SpO2: 98% 99% 100% 100%  Weight:      Height:        Intake/Output Summary (Last 24 hours) at 06/26/2024 1711 Last data filed at 06/26/2024 1500 Gross per 24 hour  Intake 446.04 ml  Output --  Net 446.04 ml    Filed Weights   06/22/24 1148  Weight: 74.8 kg    Examination:   Constitutional: NAD, AAOx3 HEENT: conjunctivae and lids normal, EOMI CV: No cyanosis.   RESP: normal respiratory effort, on RA Neuro:  II - XII grossly intact.   Psych: Normal mood and affect.  Appropriate judgement and reason   Data Reviewed: I have personally reviewed labs and imaging studies  Time spent: 35 minutes  Ellouise Haber, MD [facility] If 7PM-7AM, please contact night-coverage 06/26/2024, 5:11 PM

## 2024-06-26 NOTE — Consult Note (Signed)
 PHARMACY - ANTICOAGULATION CONSULT NOTE  Pharmacy Consult for Heparin  Infusion Indication: pulmonary embolus  Allergies  Allergen Reactions   Apixaban  Swelling    Caused lip swelling. Has taken xarelto  in the past with no complications.   Losartan Swelling    Patient Measurements: Height: 5' 6 (167.6 cm) Weight: 74.8 kg (165 lb) IBW/kg (Calculated) : 63.8 HEPARIN  DW (KG): 74.8  Vital Signs: Temp: 98.5 F (36.9 C) (07/21 0757) BP: 133/86 (07/21 0757) Pulse Rate: 63 (07/21 0757)  Labs: Recent Labs    06/24/24 0923 06/24/24 1442 06/25/24 0546 06/25/24 1415 06/25/24 1945 06/26/24 0617 06/26/24 1324  HGB 13.3  --  12.6*  --   --  12.4*  --   HCT 39.1  --  36.5*  --   --  36.6*  --   PLT 210  --  204  --   --  223  --   APTT  --    < > 148*   < > 68* 117* 67*  HEPARINUNFRC  --   --  1.08*  --   --  0.76*  --    < > = values in this interval not displayed.    Estimated Creatinine Clearance: 82.1 mL/min (by C-G formula based on SCr of 0.82 mg/dL).   Medical History: Past Medical History:  Diagnosis Date   Anxiety    Asthma    BPH (benign prostatic hyperplasia)    GERD (gastroesophageal reflux disease)    HTN (hypertension)    Insomnia    Palpitations    Stomach ulcer    Testicular mass     Medications:  Medications Prior to Admission  Medication Sig Dispense Refill Last Dose/Taking   albuterol  (VENTOLIN  HFA) 108 (90 Base) MCG/ACT inhaler Inhale 2 puffs into the lungs every 6 (six) hours as needed for wheezing or shortness of breath. 8 g 2 Unknown   budesonide -formoterol  (SYMBICORT ) 80-4.5 MCG/ACT inhaler Inhale 2 puffs into the lungs 2 (two) times daily.   Past Week   triamcinolone  cream (KENALOG ) 0.5 % Apply 1 Application topically 2 (two) times daily.   Past Week   [DISCONTINUED] olmesartan (BENICAR) 40 MG tablet Take 40 mg by mouth daily.      Scheduled:   cetirizine   10 mg Oral Daily   Infusions:   heparin  1,200 Units/hr (06/26/24 0716)   PRN:  acetaminophen  **OR** acetaminophen , albuterol , calcium  carbonate, diphenhydrAMINE , hydrALAZINE , ondansetron  **OR** ondansetron  (ZOFRAN ) IV, polyethylene glycol Anti-infectives (From admission, onward)    None       Assessment: 63 yr male with PMH: HTN, asthma, GERD, DVT/PE, BPH, presented to the ED with allergic reaction and swelling of lip. Patient stated that he underwent thrombectomy for pulmonary embolism in the hospital last night 06/21/2024, was discharged home this morning around 9:30 AM and took his first dose of Eliquis  just prior to discharge. He states that about 15 minutes later he began to have some tingling and swelling across his both sides of the upper lip. Apixaban  was switched to rivaroxaban , now switching to heparin  infusion. Will use aPTT to monitor heparin  due to false elevation of heparin  level with rivaroxaban  dose (last dose 7/18 1816)  Goal of Therapy:  Heparin  level 0.3-0.7 units/ml once heparin  level and aPTT correlate.  aPTT 66-102 seconds Monitor platelets by anticoagulation protocol: Yes  7/21 @ 0617:   aPTT = 117,  HL = 0.76 7/21 @ 1324:  aPTT 67s    Plan: 7/21 @ 1324:  aPTT 67s Therapeutic Continue heparin   infusion at 1200 units/hr Cecheck HL on 7/22 with AM labs  CBC daily    Will M. Lenon, PharmD Clinical Pharmacist 06/26/2024 2:10 PM

## 2024-06-27 DIAGNOSIS — T783XXA Angioneurotic edema, initial encounter: Secondary | ICD-10-CM | POA: Diagnosis not present

## 2024-06-27 LAB — CBC
HCT: 38.5 % — ABNORMAL LOW (ref 39.0–52.0)
Hemoglobin: 12.8 g/dL — ABNORMAL LOW (ref 13.0–17.0)
MCH: 32.3 pg (ref 26.0–34.0)
MCHC: 33.2 g/dL (ref 30.0–36.0)
MCV: 97.2 fL (ref 80.0–100.0)
Platelets: 233 K/uL (ref 150–400)
RBC: 3.96 MIL/uL — ABNORMAL LOW (ref 4.22–5.81)
RDW: 13 % (ref 11.5–15.5)
WBC: 7.4 K/uL (ref 4.0–10.5)
nRBC: 0 % (ref 0.0–0.2)

## 2024-06-27 LAB — APTT: aPTT: 70 s — ABNORMAL HIGH (ref 24–36)

## 2024-06-27 LAB — HEPARIN LEVEL (UNFRACTIONATED): Heparin Unfractionated: 0.51 [IU]/mL (ref 0.30–0.70)

## 2024-06-27 MED ORDER — LEVOCETIRIZINE DIHYDROCHLORIDE 5 MG PO TABS
5.0000 mg | ORAL_TABLET | Freq: Every evening | ORAL | Status: AC
Start: 2024-06-27 — End: ?

## 2024-06-27 MED ORDER — RIVAROXABAN 15 MG PO TABS
15.0000 mg | ORAL_TABLET | Freq: Two times a day (BID) | ORAL | Status: DC
  Administered 2024-06-27: 15 mg via ORAL
  Filled 2024-06-27: qty 1

## 2024-06-27 NOTE — Discharge Summary (Signed)
 Physician Discharge Summary   Benjamin Ford  male DOB: 1961/03/24  FMW:[id number]  PCP: Valora Agent, MD  Admit date: 06/22/2024 Discharge date: 06/27/2024  Admitted From: home Disposition:  home Wife updated at bedside prior to discharge. CODE STATUS: Full code   Hospital Course:  For full details, please see H&P, progress notes, consult notes and ancillary notes.  Briefly,  Benjamin Ford is a 63 y.o. male with medical history significant for HTN, asthma, DVT/PE, anxiety who presented to ED complaining of allergic reaction and swelling of lip.    Patient stated that he underwent thrombectomy for pulmonary embolism during last hospitalization on 06/21/2024, was discharged home on 7/17 around 9:30 AM and took his first dose of Eliquis  just prior to discharge.  He stated that about 15 minutes later he began to have some tingling and swelling across his both sides of the upper lip.  The swelling slightly progressed and he received Benadryl  and Pepcid  at home.  Still he had swelling progressed and decided to come to the emergency room for evaluation.    Angioedema --Pt was given Solu-Medrol  in the emergency room with resolution of his lip and facial swelling.  Reaction was presumed due to allergic reaction to Eliquis , however, pt may be having rebound reaction from suddenly stopping home antihistamine.  However, to be on the safe side, Eliquis  and home olmesartan d/c'ed.  Rebound allergic reaction from stopping antihistamine --pt was ordered Xarelto  on admission, since pt had taken Xarelto  in the past with no issue.  Shortly after pt took Xarelto , itchy skin rash and welts started to appear in various parts of his body, which resolved quickly with IV benadryl .  Xarelto  was then held and pt was put on heparin  gtt which he tolerated during recent hospitalization, however, pt continued to have rash/welts flare ups 2-3 times per day, all easily resolved with IV benadryl . --After further  questioning, wife said pt had been taking Xyzal  since about 2020 up until recent admission on 06/20/24 (it wasn't on pt's home med list, so wasn't ordered).  It's likely that pt was having re-bound effect from suddenly stopping chronic anti-histamine.  Pt was resumed on Zyrtec  (same active ingredient as Xyzal ) on 7/19, and rash flare-ups decreased, and by the time of discharge on 7/22, pt hadn't had rash flare-up in about 24 hours.   --pt was therefore resume on Xarelto , and monitored for 2 hours after with no allergic reaction.  Pt was therefore discharged on Xarelto . --cont home Xyzal .  Pt and wife were advised to not suddenly stop this medication.  If pt wants to get off this medication, pt should try a very slow taper off.  Recent DVT/PE --pt was discharged on Xarelto .     HTN - Blood pressure is stable --d/c'ed home olmesartan    Hyperglycemia --likely due to steroid --no need for SSI   Discharge Diagnoses:  Principal Problem:   Angioedema Active Problems:   Allergic reaction   30 Day Unplanned Readmission Risk Score    Flowsheet Row ED to Hosp-Admission (Current) from 06/22/2024 in [facility] (1A)  30 Day Unplanned Readmission Risk Score (%) 9.35 Filed at 06/27/2024 1600    This score is the patient's risk of an unplanned readmission within 30 days of being discharged (0 -100%). The score is based on dignosis, age, lab data, medications, orders, and past utilization.   Low:  0-14.9   Medium: 15-21.9   High: 22-29.9   Extreme: 30  and above         Discharge Instructions:  Allergies as of 06/27/2024       Reactions   Apixaban  Swelling   Caused lip swelling. Has taken xarelto  in the past with no complications.   Losartan Swelling        Medication List     STOP taking these medications    olmesartan 40 MG tablet Commonly known as: BENICAR       TAKE these medications    albuterol  108 (90 Base) MCG/ACT inhaler Commonly  known as: VENTOLIN  HFA Inhale 2 puffs into the lungs every 6 (six) hours as needed for wheezing or shortness of breath.   budesonide -formoterol  80-4.5 MCG/ACT inhaler Commonly known as: SYMBICORT  Inhale 2 puffs into the lungs 2 (two) times daily.   levocetirizine 5 MG tablet Commonly known as: Xyzal  Allergy 24HR Take 1 tablet (5 mg total) by mouth every evening. Home med.   Rivaroxaban  Starter Pack (15 mg and 20 mg) Commonly known as: XARELTO  STARTER PACK Follow package directions: Take one 15mg  tablet by mouth twice a day. On day 22, switch to one 20mg  tablet once a day. Take with food.   triamcinolone  cream 0.5 % Commonly known as: KENALOG  Apply 1 Application topically 2 (two) times daily.         Follow-up Information     [facility] Emergency Department at [facility] .   Specialty: Emergency Medicine Why: If symptoms worsen Contact information: [address] [place]  [address] [phone]        Valora Agent, MD. Schedule an appointment as soon as possible for a visit in 1 week(s).   Specialty: Family Medicine Contact information: [address] [facility] [place] [address] [phone]                 Allergies  Allergen Reactions   Apixaban  Swelling    Caused lip swelling. Has taken xarelto  in the past with no complications.   Losartan Swelling     The results of significant diagnostics from this hospitalization (including imaging, microbiology, ancillary and laboratory) are listed below for reference.   Consultations:   Procedures/Studies: PERIPHERAL VASCULAR CATHETERIZATION Result Date: 06/21/2024 See surgical note for result.  CT Angio Chest PE W/Cm &/Or Wo Cm Result Date: 06/20/2024 CLINICAL DATA:  Pulmonary embolism (PE) suspected, high prob Right leg pain with occlusive right lower extremity DVT on same date Doppler ultrasound. EXAM: CT ANGIOGRAPHY CHEST WITH CONTRAST TECHNIQUE: Multidetector  CT imaging of the chest was performed using the standard protocol during bolus administration of intravenous contrast. Multiplanar CT image reconstructions and MIPs were obtained to evaluate the vascular anatomy. RADIATION DOSE REDUCTION: This exam was performed according to the departmental dose-optimization program which includes automated exposure control, adjustment of the mA and/or kV according to patient size and/or use of iterative reconstruction technique. CONTRAST:  75mL OMNIPAQUE  IOHEXOL  350 MG/ML SOLN COMPARISON:  Chest radiographs 11/10/2021. Right lower extremity venous Doppler ultrasound 06/20/2024. FINDINGS: Cardiovascular: The pulmonary arteries are well opacified with contrast to the level of the segmental branches. Study is positive for bilateral acute pulmonary thromboembolic disease. There is lobar involvement bilaterally, greater on the right, with occlusive components in the right lower lobe. There is a small amount of linear nonocclusive thrombus in the right main pulmonary artery. Overall heart size is within normal limits, although there is evidence for mild right ventricular strain with a RV to LV ratio of 1.04. No significant pericardial fluid.  Mediastinum/Nodes: There are no enlarged mediastinal, hilar or axillary lymph nodes. The thyroid gland, trachea and esophagus demonstrate no significant findings. Lungs/Pleura: No pleural effusion or pneumothorax. Mild centrilobular emphysema. There is mild dependent atelectasis in both lung bases. Solid left lower lobe pulmonary nodule measures 7 mm on image 107/6. No confluent airspace disease or definite pulmonary infarct. Upper abdomen: No acute findings are identified in the visualized upper abdomen. Mild hepatic steatosis. Punctate nonobstructing left renal calculus. Musculoskeletal/Chest wall: There is no chest wall mass or suspicious osseous finding. Multilevel spondylosis. Review of the MIP images confirms the above findings. IMPRESSION: 1.  Study is positive for bilateral acute pulmonary thromboembolic disease with lobar involvement bilaterally, greater on the right. There is evidence for mild right ventricular strain (RV/LV Ratio = 1.04) consistent with at least submassive (intermediate risk) PE. The presence of right heart strain has been associated with an increased risk of morbidity and mortality. Please refer to the Code PE Focused order set in EPIC. 2. No confluent airspace disease or definite pulmonary infarct. 3. Solid 7 mm left lower lobe pulmonary nodule. Per Fleischner Society Guidelines, recommend a non-contrast Chest CT at 6-12 months. If patient is high risk for malignancy, consider an additional non-contrast Chest CT at 18-24 months. If patient is low risk for malignancy, non-contrast Chest CT at 18-24 months is optional. These guidelines do not apply to immunocompromised patients and patients with cancer. Follow up in patients with significant comorbidities as clinically warranted. For lung cancer screening, adhere to Lung-RADS guidelines. Reference: Radiology. 2017; 284(1):228-43. 4.  Emphysema (ICD10-J43.9). 5. Critical Value/emergent results were called by telephone at the time of interpretation on 06/20/2024 at 6:00 pm to provider [facility] , who verbally acknowledged these results. Electronically Signed   By: Elsie Perone M.D.   On: 06/20/2024 18:02   US  Venous Img Lower Unilateral Right (DVT) Result Date: 06/20/2024 CLINICAL DATA:  History of right calf DVT 1 year ago EXAM: RIGHT LOWER EXTREMITY VENOUS DOPPLER ULTRASOUND TECHNIQUE: Gray-scale sonography with compression, as well as color and duplex ultrasound, were performed to evaluate the deep venous system(s) from the level of the common femoral vein through the popliteal and proximal calf veins. COMPARISON:  None Available. FINDINGS: VENOUS Occlusive thrombus within the right calf veins extending into the popliteal vein proximally to the level of the distal right  common femoral vein. The great saphenous vein and profundus femoris are normally compressible and patent. Limited views of the contralateral common femoral vein are unremarkable. OTHER None. Limitations: none IMPRESSION: Occlusive thrombus within the right calf veins extending into the popliteal vein proximally to the level of the distal right common femoral vein. These results will be called to the ordering clinician or representative by the Radiology Department at the imaging location. Electronically Signed   By: Limin  Xu M.D.   On: 06/20/2024 11:42      Labs: BNP (last 3 results) No results for input(s): BNP in the last 8760 hours. Basic Metabolic Panel: Recent Labs  Lab 06/22/24 0134 06/22/24 1515 06/23/24 0503  NA 139 133* 138  K 4.0 4.1 4.0  CL 109 103 107  CO2 23 23 23   GLUCOSE 198* 121* 144*  BUN 23 21 23   CREATININE 0.78 0.74 0.82  CALCIUM  8.5* 9.1 9.1   Liver Function Tests: Recent Labs  Lab 06/22/24 1515 06/23/24 0503  AST 18 18  ALT 21 18  ALKPHOS 39 36*  BILITOT 0.7 0.4  PROT 6.5 6.1*  ALBUMIN 3.7 3.4*  No results for input(s): LIPASE, AMYLASE in the last 168 hours. No results for input(s): AMMONIA in the last 168 hours. CBC: Recent Labs  Lab 06/22/24 1515 06/23/24 0503 06/24/24 0923 06/25/24 0546 06/26/24 0617 06/27/24 0314  WBC 9.3 10.1 8.9 7.8 7.1 7.4  NEUTROABS 8.4*  --   --   --   --   --   HGB 13.3 13.3 13.3 12.6* 12.4* 12.8*  HCT 40.4 39.5 39.1 36.5* 36.6* 38.5*  MCV 97.6 97.8 96.3 95.3 96.8 97.2  PLT 205 232 210 204 223 233   Cardiac Enzymes: No results for input(s): CKTOTAL, CKMB, CKMBINDEX, TROPONINI in the last 168 hours. BNP: Invalid input(s): POCBNP CBG: No results for input(s): GLUCAP in the last 168 hours. D-Dimer No results for input(s): DDIMER in the last 72 hours. Hgb A1c No results for input(s): HGBA1C in the last 72 hours. Lipid Profile No results for input(s): CHOL, HDL, LDLCALC, TRIG,  CHOLHDL, LDLDIRECT in the last 72 hours. Thyroid function studies No results for input(s): TSH, T4TOTAL, T3FREE, THYROIDAB in the last 72 hours.  Invalid input(s): FREET3 Anemia work up No results for input(s): VITAMINB12, FOLATE, FERRITIN, TIBC, IRON, RETICCTPCT in the last 72 hours. Urinalysis    Component Value Date/Time   COLORURINE YELLOW 01/06/2021 1446   APPEARANCEUR CLEAR 01/06/2021 1446   LABSPEC 1.020 01/06/2021 1446   PHURINE 6.5 01/06/2021 1446   GLUCOSEU NEGATIVE 01/06/2021 1446   HGBUR NEGATIVE 01/06/2021 1446   BILIRUBINUR NEGATIVE 01/06/2021 1446   KETONESUR NEGATIVE 01/06/2021 1446   PROTEINUR NEGATIVE 01/06/2021 1446   NITRITE NEGATIVE 01/06/2021 1446   LEUKOCYTESUR NEGATIVE 01/06/2021 1446   Sepsis Labs Recent Labs  Lab 06/24/24 0923 06/25/24 0546 06/26/24 0617 06/27/24 0314  WBC 8.9 7.8 7.1 7.4   Microbiology No results found for this or any previous visit (from the past 240 hours).   Total time spend on discharging this patient, including the last patient exam, discussing the hospital stay, instructions for ongoing care as it relates to all pertinent caregivers, as well as preparing the medical discharge records, prescriptions, and/or referrals as applicable, is 45 minutes.    Ellouise Haber, MD  [facility] 06/27/2024, 4:27 PM

## 2024-06-27 NOTE — Consult Note (Signed)
 PHARMACY - ANTICOAGULATION CONSULT NOTE  Pharmacy Consult for Heparin  Infusion Indication: pulmonary embolus  Allergies  Allergen Reactions   Apixaban  Swelling    Caused lip swelling. Has taken xarelto  in the past with no complications.   Losartan Swelling   Patient Measurements: Height: 5' 6 (167.6 cm) Weight: 74.8 kg (165 lb) IBW/kg (Calculated) : 63.8 HEPARIN  DW (KG): 74.8  Vital Signs: Temp: 97.9 F (36.6 C) (07/22 0340) BP: 144/75 (07/22 0340) Pulse Rate: 58 (07/22 0340)  Labs: Recent Labs    06/25/24 0546 06/25/24 1415 06/26/24 0617 06/26/24 1324 06/26/24 1956 06/27/24 0314  HGB 12.6*  --  12.4*  --   --  12.8*  HCT 36.5*  --  36.6*  --   --  38.5*  PLT 204  --  223  --   --  233  APTT 148*   < > 117* 67* 78* 70*  HEPARINUNFRC 1.08*  --  0.76*  --   --  0.51   < > = values in this interval not displayed.   Estimated Creatinine Clearance: 82.1 mL/min (by C-G formula based on SCr of 0.82 mg/dL).  Medical History: Past Medical History:  Diagnosis Date   Anxiety    Asthma    BPH (benign prostatic hyperplasia)    GERD (gastroesophageal reflux disease)    HTN (hypertension)    Insomnia    Palpitations    Stomach ulcer    Testicular mass    Medications:  Medications Prior to Admission  Medication Sig Dispense Refill Last Dose/Taking   albuterol  (VENTOLIN  HFA) 108 (90 Base) MCG/ACT inhaler Inhale 2 puffs into the lungs every 6 (six) hours as needed for wheezing or shortness of breath. 8 g 2 Unknown   budesonide -formoterol  (SYMBICORT ) 80-4.5 MCG/ACT inhaler Inhale 2 puffs into the lungs 2 (two) times daily.   Past Week   triamcinolone  cream (KENALOG ) 0.5 % Apply 1 Application topically 2 (two) times daily.   Past Week   [DISCONTINUED] olmesartan (BENICAR) 40 MG tablet Take 40 mg by mouth daily.      Scheduled:   cetirizine   10 mg Oral Daily   Infusions:   heparin  1,200 Units/hr (06/26/24 1746)   PRN: acetaminophen  **OR** acetaminophen , albuterol ,  calcium  carbonate, diphenhydrAMINE , hydrALAZINE , ondansetron  **OR** ondansetron  (ZOFRAN ) IV, polyethylene glycol Anti-infectives (From admission, onward)    None      Assessment: 63 yr male with PMH: HTN, asthma, GERD, DVT/PE, BPH, presented to the ED with allergic reaction and swelling of lip. Patient stated that he underwent thrombectomy for pulmonary embolism in the hospital last night 06/21/2024, was discharged home this morning around 9:30 AM and took his first dose of Eliquis  just prior to discharge. He states that about 15 minutes later he began to have some tingling and swelling across his both sides of the upper lip. Apixaban  was switched to rivaroxaban , now switching to heparin  infusion. Will use aPTT to monitor heparin  due to false elevation of heparin  level with rivaroxaban  dose (last dose 7/18 1816).   Goal of Therapy:  Heparin  level 0.3-0.7 units/ml once heparin  level and aPTT correlate.  aPTT 66-102 seconds Monitor platelets by anticoagulation protocol: Yes  Labs: 7/21 @ 0617:   aPTT 117,  HL = 0.76 7/21 @ 1324:  aPTT 67, therapeutic  7/21 @ 1956:   aPTT 78, therapeutic x 2 7/22 0314 aPTT 70, therapeutic x 3 / HL 0.51 correlating  Plan: Continue heparin  infusion at current rate of 1200 units/hr  Recheck HL daily with  AM labs while therapeutic Monitor CBC daily   Rankin CANDIE Dills, PharmD, [facility] 06/27/2024 5:11 AM

## 2024-06-27 NOTE — Progress Notes (Signed)
 Discharge instructions given to patient, questions answered. IV removed without complications. Patient transporting home via car by his wife.

## 2024-07-10 ENCOUNTER — Encounter: Payer: Self-pay | Admitting: Urology

## 2024-07-13 ENCOUNTER — Telehealth (INDEPENDENT_AMBULATORY_CARE_PROVIDER_SITE_OTHER): Payer: Self-pay

## 2024-07-13 NOTE — Telephone Encounter (Signed)
 I reached out and talk to the patient directly.  He actually notes that he is not wearing compression.  I have discussed with him that in the case of his blood clot he is likely experiencing some postphlebitic symptoms and use of compression will help with the swelling that he is experiencing.  I have advised him to begin wearing them at the beginning of the day and to take them off before bedtime and he should wear these consistently, meaning every day.  I have advised against diuretics as typically swelling caused by DVT is not amenable to treatment with diuretics.  I offered him some pain medicine but at this time he we will move forward with use of Tylenol .  He does have an upcoming visit with me in a little over a week.  At this time we will keep that visit.

## 2024-07-13 NOTE — Telephone Encounter (Signed)
 Patient left message on nurse line in reference to pain in his bilateral lower extremities.  I returned patients call and he stated he elevates his legs when he is at home, but he has to work and the pain and swelling increase while he is there. He stated last night while in bed he experienced tightness, warmth like fire/burning, and it felt as if his skin was going to split open. He tries to elevate when he can and wears compression hose as tolerable. Pain is currently 7/10 at this time. Please advise.

## 2024-07-21 ENCOUNTER — Other Ambulatory Visit (INDEPENDENT_AMBULATORY_CARE_PROVIDER_SITE_OTHER): Payer: Self-pay | Admitting: Vascular Surgery

## 2024-07-21 ENCOUNTER — Encounter: Payer: Self-pay | Admitting: Internal Medicine

## 2024-07-21 ENCOUNTER — Inpatient Hospital Stay: Attending: Internal Medicine | Admitting: Internal Medicine

## 2024-07-21 ENCOUNTER — Inpatient Hospital Stay

## 2024-07-21 VITALS — BP 145/77 | HR 76 | Temp 98.3°F | Resp 18 | Ht 66.0 in | Wt 175.0 lb

## 2024-07-21 DIAGNOSIS — Z801 Family history of malignant neoplasm of trachea, bronchus and lung: Secondary | ICD-10-CM | POA: Insufficient documentation

## 2024-07-21 DIAGNOSIS — Z87891 Personal history of nicotine dependence: Secondary | ICD-10-CM | POA: Diagnosis not present

## 2024-07-21 DIAGNOSIS — I2609 Other pulmonary embolism with acute cor pulmonale: Secondary | ICD-10-CM | POA: Diagnosis present

## 2024-07-21 DIAGNOSIS — Z8042 Family history of malignant neoplasm of prostate: Secondary | ICD-10-CM | POA: Insufficient documentation

## 2024-07-21 DIAGNOSIS — Z7901 Long term (current) use of anticoagulants: Secondary | ICD-10-CM | POA: Diagnosis not present

## 2024-07-21 DIAGNOSIS — Z808 Family history of malignant neoplasm of other organs or systems: Secondary | ICD-10-CM | POA: Diagnosis not present

## 2024-07-21 DIAGNOSIS — Z86718 Personal history of other venous thrombosis and embolism: Secondary | ICD-10-CM | POA: Insufficient documentation

## 2024-07-21 DIAGNOSIS — I82401 Acute embolism and thrombosis of unspecified deep veins of right lower extremity: Secondary | ICD-10-CM

## 2024-07-21 LAB — D-DIMER, QUANTITATIVE: D-Dimer, Quant: 0.76 ug{FEU}/mL — ABNORMAL HIGH (ref 0.00–0.50)

## 2024-07-21 NOTE — Progress Notes (Signed)
 Patient was in hospital on 06/27/2024, where they found the clots in his right leg and in his chest mostly the upper right lung. They removed most of the lung blood clots. He is now on xarelto  starter pack 20 mg. He is still having the discomfort in his leg.

## 2024-07-21 NOTE — Progress Notes (Signed)
 [facility] CONSULT NOTE  Patient Care Team: Valora Agent, MD as PCP - General (Family Medicine) Rennie Benjamin SAUNDERS, MD as Consulting Physician (Oncology)  CHIEF COMPLAINTS/PURPOSE OF CONSULTATION: DVT/PE  #  Oncology History   No history exists.     HISTORY OF PRESENTING ILLNESS: Patient ambulating-independently. Accompanied by  wife  Benjamin Ford 63 y.o.  male JULY 2024- with a  prior history of probably provoked  DVT of the right lower extremity- has been referred to us  regarding recent DVT/PE.    Patient was recently admitted to hospital for right leg swelling pain.  Also noted to have shortness of breath/fatigue.   7/16.  Patient with submassive bilateral PE on CT scan.  S/p evaluation with vascular Dr. Jama - thrombolysis of bilateral pulmonary arteries with tPA and mechanical thrombectomy of bilateral lobar pulmonary arteries.  Patient discharged on Eliquis .  However given the possible reaction to Eliquis -patient currently on Xarelto .   Noted to have significant movement of his breathing.  Mild swelling in the right lower extremity but compliant with his compression stockings.   With regards risk factors: Long distance travel- > 8 hours- none  Recent surgery GA; Immobilization/trauma: none  Previous history of DVT/PE:  JULY 2024 [on boot- sec to plantar fascitis x 8 weeks] Obesity:none  Smoking: quit remotely.  Family history: none   Cancer screening:  colonoscopy- in last 2-3 years;   Review of Systems  Constitutional:  Negative for chills, diaphoresis, fever, malaise/fatigue and weight loss.  HENT:  Negative for nosebleeds and sore throat.   Eyes:  Negative for double vision.  Respiratory:  Negative for cough, hemoptysis, sputum production, shortness of breath and wheezing.   Cardiovascular:  Negative for chest pain, palpitations, orthopnea and leg swelling.  Gastrointestinal:  Negative for abdominal pain, blood in stool, constipation,  diarrhea, heartburn, melena, nausea and vomiting.  Genitourinary:  Negative for dysuria, frequency and urgency.  Musculoskeletal:  Negative for back pain and joint pain.  Skin: Negative.  Negative for itching and rash.  Neurological:  Negative for dizziness, tingling, focal weakness, weakness and headaches.  Endo/Heme/Allergies:  Does not bruise/bleed easily.  Psychiatric/Behavioral:  Negative for depression. The patient is not nervous/anxious and does not have insomnia.      MEDICAL HISTORY:  Past Medical History:  Diagnosis Date   Anxiety    Asthma    BPH (benign prostatic hyperplasia)    GERD (gastroesophageal reflux disease)    HTN (hypertension)    Insomnia    Palpitations    Stomach ulcer    Testicular mass     SURGICAL HISTORY: Past Surgical History:  Procedure Laterality Date   COLONOSCOPY WITH PROPOFOL  N/A 05/27/2020   Procedure: COLONOSCOPY WITH PROPOFOL ;  Surgeon: Therisa Bi, MD;  Location[facility] ENDOSCOPY;  Service: Gastroenterology;  Laterality: N/A;   INGUINAL HERNIA REPAIR  2008   MASTECTOMY Bilateral 1992/1981   PULMONARY THROMBECTOMY N/A 06/21/2024   Procedure: PULMONARY THROMBECTOMY;  Surgeon: Jama Cordella MATSU, MD;  Location: [facility] INVASIVE CV LAB;  Service: Cardiovascular;  Laterality: N/A;   VASECTOMY  1983    SOCIAL HISTORY: Social History   Socioeconomic History   Marital status: Married    Spouse name: Not on file   Number of children: Not on file   Years of education: Not on file   Highest education level: Not on file  Occupational History   Not on file  Tobacco Use   Smoking status: Former    Current packs/day: 0.00  Average packs/day: 1 pack/day for 14.0 years (14.0 ttl pk-yrs)    Types: Cigarettes    Start date: 66    Quit date: 1999    Years since quitting: 26.6   Smokeless tobacco: Never   Tobacco comments:    smoked <1  ppd off and on from mid 20s to mid 40s.   Vaping Use   Vaping status: Never Used  Substance and Sexual  Activity   Alcohol use: Yes    Alcohol/week: 0.0 standard drinks of alcohol    Comment: moderate   Drug use: No   Sexual activity: Not Currently  Other Topics Concern   Not on file  Social History Narrative   Not on file   Social Drivers of Health   Financial Resource Strain: Patient Declined (08/03/2023)   Received from [facility]   Overall Financial Resource Strain (CARDIA)    Difficulty of Paying Living Expenses: Patient declined  Food Insecurity: No Food Insecurity (07/21/2024)   Hunger Vital Sign    Worried About Running Out of Food in the Last Year: Never true    Ran Out of Food in the Last Year: Never true  Transportation Needs: No Transportation Needs (07/21/2024)   PRAPARE - Administrator, Civil Service (Medical): No    Lack of Transportation (Non-Medical): No  Physical Activity: Not on file  Stress: Not on file  Social Connections: Not on file  Intimate Partner Violence: Not At Risk (07/21/2024)   Humiliation, Afraid, Rape, and Kick questionnaire    Fear of Current or Ex-Partner: No    Emotionally Abused: No    Physically Abused: No    Sexually Abused: No    FAMILY HISTORY: Family History  Problem Relation Age of Onset   Prostate cancer Father    Cancer Father        bone, prostate   COPD Mother    Cancer Mother        lung   Cancer Maternal Grandmother        brain   Aneurysm Paternal Grandmother    Prostate cancer Brother    Prostate cancer Brother    Kidney disease Neg Hx    Bladder Cancer Neg Hx     ALLERGIES:  is allergic to apixaban  and losartan.  MEDICATIONS:  Current Outpatient Medications  Medication Sig Dispense Refill   albuterol  (VENTOLIN  HFA) 108 (90 Base) MCG/ACT inhaler Inhale 2 puffs into the lungs every 6 (six) hours as needed for wheezing or shortness of breath. 8 g 2   budesonide -formoterol  (SYMBICORT ) 80-4.5 MCG/ACT inhaler Inhale 2 puffs into the lungs 2 (two) times daily.     levocetirizine (XYZAL   ALLERGY 24HR) 5 MG tablet Take 1 tablet (5 mg total) by mouth every evening. Home med.     RIVAROXABAN  (XARELTO ) VTE STARTER PACK (15 & 20 MG) Follow package directions: Take one 15mg  tablet by mouth twice a day. On day 22, switch to one 20mg  tablet once a day. Take with food. 51 each 0   triamcinolone  cream (KENALOG ) 0.5 % Apply 1 Application topically 2 (two) times daily.     No current facility-administered medications for this visit.       PHYSICAL EXAMINATION:  Vitals:   07/21/24 1402  BP: (!) 145/77  Pulse: 76  Resp: 18  Temp: 98.3 F (36.8 C)  SpO2: 99%   Filed Weights   07/21/24 1402  Weight: 175 lb (79.4 kg)    Physical Exam Vitals and  nursing note reviewed.  HENT:     Head: Normocephalic and atraumatic.     Mouth/Throat:     Pharynx: Oropharynx is clear.  Eyes:     Extraocular Movements: Extraocular movements intact.     Pupils: Pupils are equal, round, and reactive to light.  Cardiovascular:     Rate and Rhythm: Normal rate and regular rhythm.  Pulmonary:     Comments: Decreased breath sounds bilaterally.  Abdominal:     Palpations: Abdomen is soft.  Musculoskeletal:        General: Normal range of motion.     Cervical back: Normal range of motion.  Skin:    General: Skin is warm.  Neurological:     General: No focal deficit present.     Mental Status: He is alert and oriented to person, place, and time.  Psychiatric:        Behavior: Behavior normal.        Judgment: Judgment normal.      LABORATORY DATA:  I have reviewed the data as listed Lab Results  Component Value Date   WBC 7.4 06/27/2024   HGB 12.8 (L) 06/27/2024   HCT 38.5 (L) 06/27/2024   MCV 97.2 06/27/2024   PLT 233 06/27/2024   Recent Labs    06/20/24 1442 06/22/24 0134 06/22/24 1515 06/23/24 0503  NA 139 139 133* 138  K 4.1 4.0 4.1 4.0  CL 102 109 103 107  CO2 28 23 23 23   GLUCOSE 114* 198* 121* 144*  BUN 16 23 21 23   CREATININE 0.79 0.78 0.74 0.82  CALCIUM  9.5  8.5* 9.1 9.1  GFRNONAA >60 >60 >60 >60  PROT 6.4*  --  6.5 6.1*  ALBUMIN 4.0  --  3.7 3.4*  AST 22  --  18 18  ALT 29  --  21 18  ALKPHOS 38  --  39 36*  BILITOT 1.2  --  0.7 0.4    RADIOGRAPHIC STUDIES: I have personally reviewed the radiological images as listed and agreed with the findings in the report. PERIPHERAL VASCULAR CATHETERIZATION Result Date: 06/21/2024 See surgical note for result.   ASSESSMENT & PLAN:   Acute pulmonary embolism with acute cor pulmonale (HCC) # JULY 15th, 2025-RIGHT LE_ acute DVT- CTA-  positive for bilateral acute pulmonary thromboembolic disease with lobar involvement bilaterally, greater on the right. There is evidence for mild right ventricular strain ) consistent with at least submassive (intermediate risk) PE.  Status post thrombectomy [Dr.Schneir].   Patient currently tolerating xarelto  20 mg/day [? Reaction to Eliquis ] well.  Agree with anticoagulation-to help prevent further blood clots at this time.    # Given prior history of right lower extremity DVT possible provoked [in 2024]; and with acute PE/DVT- unprovoked given the absence of any identifiable reasons.-Long-term anticoagulation/indefinite anticoagulation is recommended.  However recommend further hypercoagulable workup to rule out any other causes.  Thank you Dr. Valora  for allowing me to participate in the care of your pleasant patient. Please do not hesitate to contact me with questions or concerns in the interim.   # DISPOSITION: # Labs today-ordered # follow up in 2-3 weeks- MD: NO labs- Dr.B  # I reviewed the blood work- with the patient in detail; also reviewed the imaging independently [as summarized above]; and with the patient in detail.       Benjamin JONELLE Joe, MD 07/21/2024 3:28 PM

## 2024-07-21 NOTE — Assessment & Plan Note (Addendum)
#   JULY 15th, 2025-RIGHT LE_ acute DVT- CTA-  positive for bilateral acute pulmonary thromboembolic disease with lobar involvement bilaterally, greater on the right. There is evidence for mild right ventricular strain ) consistent with at least submassive (intermediate risk) PE.  Status post thrombectomy [Dr.Schneir].   Patient currently tolerating xarelto  20 mg/day [? Reaction to Eliquis ] well.  Agree with anticoagulation-to help prevent further blood clots at this time.    # Given prior history of right lower extremity DVT possible provoked [in 2024]; and with acute PE/DVT- unprovoked given the absence of any identifiable reasons.-Long-term anticoagulation/indefinite anticoagulation is recommended.  However recommend further hypercoagulable workup to rule out any other causes.  Thank you Dr. Valora  for allowing me to participate in the care of your pleasant patient. Please do not hesitate to contact me with questions or concerns in the interim.   # DISPOSITION: # Labs today-ordered # follow up in 2-3 weeks- MD: NO labs- Dr.B  # I reviewed the blood work- with the patient in detail; also reviewed the imaging independently [as summarized above]; and with the patient in detail.

## 2024-07-22 LAB — PROTEIN C ACTIVITY: Protein C Activity: 97 % (ref 73–180)

## 2024-07-22 LAB — PROTEIN S ACTIVITY: Protein S Activity: 114 % (ref 63–140)

## 2024-07-25 ENCOUNTER — Ambulatory Visit (INDEPENDENT_AMBULATORY_CARE_PROVIDER_SITE_OTHER): Payer: Self-pay

## 2024-07-25 ENCOUNTER — Encounter (INDEPENDENT_AMBULATORY_CARE_PROVIDER_SITE_OTHER): Payer: Self-pay | Admitting: Nurse Practitioner

## 2024-07-25 ENCOUNTER — Ambulatory Visit (INDEPENDENT_AMBULATORY_CARE_PROVIDER_SITE_OTHER): Payer: Self-pay | Admitting: Nurse Practitioner

## 2024-07-25 ENCOUNTER — Encounter (INDEPENDENT_AMBULATORY_CARE_PROVIDER_SITE_OTHER): Payer: Self-pay

## 2024-07-25 VITALS — BP 161/65 | HR 71 | Ht 66.0 in | Wt 175.0 lb

## 2024-07-25 DIAGNOSIS — I10 Essential (primary) hypertension: Secondary | ICD-10-CM

## 2024-07-25 DIAGNOSIS — I82401 Acute embolism and thrombosis of unspecified deep veins of right lower extremity: Secondary | ICD-10-CM

## 2024-07-25 DIAGNOSIS — I2609 Other pulmonary embolism with acute cor pulmonale: Secondary | ICD-10-CM | POA: Diagnosis not present

## 2024-07-25 MED ORDER — RIVAROXABAN 20 MG PO TABS: 20.0000 mg | ORAL_TABLET | Freq: Every day | ORAL | 3 refills | Status: AC

## 2024-07-26 LAB — BETA-2-GLYCOPROTEIN I ABS, IGG/M/A
Beta-2 Glyco I IgG: <9 GPI IgG units (ref 0–20)
Beta-2-Glycoprotein I IgA: <9 GPI IgA units (ref 0–25)
Beta-2-Glycoprotein I IgM: <9 GPI IgM units (ref 0–32)

## 2024-07-26 LAB — FACTOR 5 LEIDEN

## 2024-07-27 LAB — ANTIPHOSPHOLIPID SYNDROME PROF
Anticardiolipin IgG: <9 GPL U/mL (ref 0–14)
Anticardiolipin IgM: <9 [MPL'U]/mL (ref 0–12)
DRVVT: 90.3 s — ABNORMAL HIGH (ref 0.0–47.0)
PTT Lupus Anticoagulant: 34 s (ref 0.0–43.5)

## 2024-07-27 LAB — DRVVT MIX: dRVVT Mix: 62.5 s — ABNORMAL HIGH (ref 0.0–40.4)

## 2024-07-27 LAB — PROTHROMBIN GENE MUTATION

## 2024-07-27 LAB — DRVVT CONFIRM: dRVVT Confirm: 1.8 ratio — ABNORMAL HIGH (ref 0.8–1.2)

## 2024-07-30 ENCOUNTER — Encounter (INDEPENDENT_AMBULATORY_CARE_PROVIDER_SITE_OTHER): Payer: Self-pay | Admitting: Nurse Practitioner

## 2024-07-30 NOTE — Progress Notes (Signed)
 Subjective:    Patient ID: Benjamin Ford, male    DOB: 10/30/1961, 63 y.o.   MRN: [id number] Chief Complaint  Patient presents with   Follow-up     Follow up with Benjamin Gwendlyn SAUNDERS, NP (Vascular Surgery) in 4 weeks (07/20/2024); Bilateral Venous Ultrasounds lower extremities for follow up DVT's     The patient presents today for follow-up evaluation after pulmonary thrombectomy on 06/21/2024.  He notes that his breathing is much improved.  He has had some issues with lower extremity edema but has been wearing medical grade compression and the swelling has improved since then.  He has also been elevating is much as possible.  He is currently on Xarelto  and taking this without significant issue.  This is his second DVT.  Today it is noted that he continues to have evidence of thrombus which is beginning to transition into a chronic phase.  There is some evidence of improvement with recanalization of the popliteal.  He previously had a nonocclusive thrombus from the calf vessels extending into the common femoral vein.    Review of Systems  Respiratory:  Negative for shortness of breath.   All other systems reviewed and are negative.      Objective:   Physical Exam Vitals reviewed.  HENT:     Head: Normocephalic.  Cardiovascular:     Rate and Rhythm: Normal rate.     Pulses: Normal pulses.  Pulmonary:     Effort: Pulmonary effort is normal.  Skin:    General: Skin is warm and dry.  Neurological:     Mental Status: He is alert and oriented to person, place, and time.  Psychiatric:        Mood and Affect: Mood normal.        Behavior: Behavior normal.        Thought Content: Thought content normal.        Judgment: Judgment normal.     BP (!) 161/65   Pulse 71   Ht 5' 6 (1.676 m)   Wt 175 lb (79.4 kg)   BMI 28.25 kg/m   Past Medical History:  Diagnosis Date   Anxiety    Asthma    BPH (benign prostatic hyperplasia)    GERD (gastroesophageal reflux disease)    HTN  (hypertension)    Insomnia    Palpitations    Stomach ulcer    Testicular mass     Social History   Socioeconomic History   Marital status: Married    Spouse name: Not on file   Number of children: Not on file   Years of education: Not on file   Highest education level: Not on file  Occupational History   Not on file  Tobacco Use   Smoking status: Former    Current packs/day: 0.00    Average packs/day: 1 pack/day for 14.0 years (14.0 ttl pk-yrs)    Types: Cigarettes    Start date: 82    Quit date: 1999    Years since quitting: 26.6   Smokeless tobacco: Never   Tobacco comments:    smoked <1  ppd off and on from mid 20s to mid 20s.   Vaping Use   Vaping status: Never Used  Substance and Sexual Activity   Alcohol use: Yes    Alcohol/week: 0.0 standard drinks of alcohol    Comment: moderate   Drug use: No   Sexual activity: Not Currently  Other Topics Concern   Not on file  Social History Narrative   Not on file   Social Drivers of Health   Financial Resource Strain: Patient Declined (08/03/2023)   Received from [facility]   Overall Financial Resource Strain (CARDIA)    Difficulty of Paying Living Expenses: Patient declined  Food Insecurity: No Food Insecurity (07/21/2024)   Hunger Vital Sign    Worried About Running Out of Food in the Last Year: Never true    Ran Out of Food in the Last Year: Never true  Transportation Needs: No Transportation Needs (07/21/2024)   PRAPARE - Administrator, Civil Service (Medical): No    Lack of Transportation (Non-Medical): No  Physical Activity: Not on file  Stress: Not on file  Social Connections: Not on file  Intimate Partner Violence: Not At Risk (07/21/2024)   Humiliation, Afraid, Rape, and Kick questionnaire    Fear of Current or Ex-Partner: No    Emotionally Abused: No    Physically Abused: No    Sexually Abused: No    Past Surgical History:  Procedure Laterality Date   COLONOSCOPY  WITH PROPOFOL  N/A 05/27/2020   Procedure: COLONOSCOPY WITH PROPOFOL ;  Surgeon: Therisa Bi, MD;  Location: [facility] ENDOSCOPY;  Service: Gastroenterology;  Laterality: N/A;   INGUINAL HERNIA REPAIR  2008   MASTECTOMY Bilateral 1992/1981   PULMONARY THROMBECTOMY N/A 06/21/2024   Procedure: PULMONARY THROMBECTOMY;  Surgeon: Jama Cordella MATSU, MD;  Location: [facility] INVASIVE CV LAB;  Service: Cardiovascular;  Laterality: N/A;   VASECTOMY  1983    Family History  Problem Relation Age of Onset   Prostate cancer Father    Cancer Father        bone, prostate   COPD Mother    Cancer Mother        lung   Cancer Maternal Grandmother        brain   Aneurysm Paternal Grandmother    Prostate cancer Brother    Prostate cancer Brother    Kidney disease Neg Hx    Bladder Cancer Neg Hx     Allergies  Allergen Reactions   Apixaban  Swelling    Caused lip swelling. Has taken xarelto  in the past with no complications.   Losartan Swelling       Latest Ref Rng & Units 06/27/2024    3:14 AM 06/26/2024    6:17 AM 06/25/2024    5:46 AM  CBC  WBC 4.0 - 10.5 K/uL 7.4  7.1  7.8   Hemoglobin 13.0 - 17.0 g/dL 87.1  87.5  87.3   Hematocrit 39.0 - 52.0 % 38.5  36.6  36.5   Platelets 150 - 400 K/uL 233  223  204       CMP     Component Value Date/Time   NA 138 06/23/2024 0503   NA 143 03/19/2021 0914   K 4.0 06/23/2024 0503   CL 107 06/23/2024 0503   CO2 23 06/23/2024 0503   GLUCOSE 144 (H) 06/23/2024 0503   BUN 23 06/23/2024 0503   BUN 17 03/19/2021 0914   CREATININE 0.82 06/23/2024 0503   CALCIUM  9.1 06/23/2024 0503   PROT 6.1 (L) 06/23/2024 0503   PROT 7.0 03/19/2021 0914   ALBUMIN 3.4 (L) 06/23/2024 0503   ALBUMIN 4.9 03/19/2021 0914   AST 18 06/23/2024 0503   ALT 18 06/23/2024 0503   ALKPHOS 36 (L) 06/23/2024 0503   BILITOT 0.4 06/23/2024 0503   BILITOT 0.5 03/19/2021 0914   EGFR 88 03/19/2021 0914  GFRNONAA >60 06/23/2024 0503     No results found.     Assessment & Plan:   1.  Acute pulmonary embolism with acute cor pulmonale, unspecified pulmonary embolism type (HCC) (Primary) Currently the patient is doing very well with anticoagulation.  Given that this is the patient's second thrombus we will plan on likely using the patient on long-term anticoagulation.  At this time however he will continue with full anticoagulation of Xarelto .  Given that his thrombus is still transitioning, we will have the patient return in 6 to 8 weeks for DVT studies to reevaluate progress.  2. Essential hypertension Continue antihypertensive medications as already ordered, these medications have been reviewed and there are no changes at this time.   Current Outpatient Medications on File Prior to Visit  Medication Sig Dispense Refill   albuterol  (VENTOLIN  HFA) 108 (90 Base) MCG/ACT inhaler Inhale 2 puffs into the lungs every 6 (six) hours as needed for wheezing or shortness of breath. 8 g 2   budesonide -formoterol  (SYMBICORT ) 80-4.5 MCG/ACT inhaler Inhale 2 puffs into the lungs 2 (two) times daily.     levocetirizine (XYZAL  ALLERGY 24HR) 5 MG tablet Take 1 tablet (5 mg total) by mouth every evening. Home med.     triamcinolone  cream (KENALOG ) 0.5 % Apply 1 Application topically 2 (two) times daily.     No current facility-administered medications on file prior to visit.    There are no Patient Instructions on file for this visit. No follow-ups on file.   Joseph Bias E Hamza Empson, NP

## 2024-08-11 ENCOUNTER — Encounter: Payer: Self-pay | Admitting: Internal Medicine

## 2024-08-11 ENCOUNTER — Inpatient Hospital Stay: Attending: Internal Medicine | Admitting: Internal Medicine

## 2024-08-11 VITALS — BP 160/96 | HR 77 | Temp 97.3°F | Resp 18 | Ht 66.0 in | Wt 175.0 lb

## 2024-08-11 DIAGNOSIS — I87009 Postthrombotic syndrome without complications of unspecified extremity: Secondary | ICD-10-CM | POA: Insufficient documentation

## 2024-08-11 DIAGNOSIS — Z7901 Long term (current) use of anticoagulants: Secondary | ICD-10-CM | POA: Diagnosis not present

## 2024-08-11 DIAGNOSIS — Z8042 Family history of malignant neoplasm of prostate: Secondary | ICD-10-CM | POA: Diagnosis not present

## 2024-08-11 DIAGNOSIS — I10 Essential (primary) hypertension: Secondary | ICD-10-CM | POA: Diagnosis not present

## 2024-08-11 DIAGNOSIS — I82431 Acute embolism and thrombosis of right popliteal vein: Secondary | ICD-10-CM | POA: Diagnosis not present

## 2024-08-11 DIAGNOSIS — Z808 Family history of malignant neoplasm of other organs or systems: Secondary | ICD-10-CM | POA: Insufficient documentation

## 2024-08-11 DIAGNOSIS — Z87891 Personal history of nicotine dependence: Secondary | ICD-10-CM | POA: Diagnosis not present

## 2024-08-11 DIAGNOSIS — I824Z1 Acute embolism and thrombosis of unspecified deep veins of right distal lower extremity: Secondary | ICD-10-CM | POA: Diagnosis not present

## 2024-08-11 DIAGNOSIS — I2609 Other pulmonary embolism with acute cor pulmonale: Secondary | ICD-10-CM | POA: Diagnosis not present

## 2024-08-11 DIAGNOSIS — Z801 Family history of malignant neoplasm of trachea, bronchus and lung: Secondary | ICD-10-CM | POA: Insufficient documentation

## 2024-08-11 DIAGNOSIS — I82411 Acute embolism and thrombosis of right femoral vein: Secondary | ICD-10-CM | POA: Insufficient documentation

## 2024-08-11 NOTE — Progress Notes (Signed)
 [facility] CONSULT NOTE  Patient Care Team: Valora Agent, MD as PCP - General (Family Medicine) Rennie Cindy SAUNDERS, MD as Consulting Physician (Oncology)  CHIEF COMPLAINTS/PURPOSE OF CONSULTATION: DVT/PE  #  Oncology History   No history exists.     HISTORY OF PRESENTING ILLNESS: Patient ambulating-independently. Accompanied by  wife  Benjamin Ford 63 y.o.  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    Noted to have significant movement of his breathing.  He continues to have mild swelling in the right lower extremity but compliant with his compression stockings.   Review of Systems  Constitutional:  Negative for chills, diaphoresis, fever, malaise/fatigue and weight loss.  HENT:  Negative for nosebleeds and sore throat.   Eyes:  Negative for double vision.  Respiratory:  Negative for cough, hemoptysis, sputum production, shortness of breath and wheezing.   Cardiovascular:  Positive for leg swelling. Negative for chest pain, palpitations and orthopnea.  Gastrointestinal:  Negative for abdominal pain, blood in stool, constipation, diarrhea, heartburn, melena, nausea and vomiting.  Genitourinary:  Negative for dysuria, frequency and urgency.  Musculoskeletal:  Negative for back pain and joint pain.  Skin: Negative.  Negative for itching and rash.  Neurological:  Negative for dizziness, tingling, focal weakness, weakness and headaches.  Endo/Heme/Allergies:  Does not bruise/bleed easily.  Psychiatric/Behavioral:  Negative for depression. The patient is not nervous/anxious and does not have insomnia.      MEDICAL HISTORY:  Past Medical History:  Diagnosis Date   Anxiety    Asthma    BPH (benign prostatic hyperplasia)    GERD (gastroesophageal reflux disease)    HTN (hypertension)    Insomnia    Palpitations    Stomach ulcer    Testicular mass     SURGICAL HISTORY: Past  Surgical History:  Procedure Laterality Date   COLONOSCOPY WITH PROPOFOL  N/A 05/27/2020   Procedure: COLONOSCOPY WITH PROPOFOL ;  Surgeon: Therisa Bi, MD;  Location: [facility] ENDOSCOPY;  Service: Gastroenterology;  Laterality: N/A;   INGUINAL HERNIA REPAIR  2008   MASTECTOMY Bilateral 1992/1981   PULMONARY THROMBECTOMY N/A 06/21/2024   Procedure: PULMONARY THROMBECTOMY;  Surgeon: Jama Cordella MATSU, MD;  Location: [facility] INVASIVE CV LAB;  Service: Cardiovascular;  Laterality: N/A;   VASECTOMY  1983    SOCIAL HISTORY: Social History   Socioeconomic History   Marital status: Married    Spouse name: Not on file   Number of children: Not on file   Years of education: Not on file   Highest education level: Not on file  Occupational History   Not on file  Tobacco Use   Smoking status: Former    Current packs/day: 0.00    Average packs/day: 1 pack/day for 14.0 years (14.0 ttl pk-yrs)    Types: Cigarettes    Start date: 43    Quit date: 1999    Years since quitting: 26.6   Smokeless tobacco: Never   Tobacco comments:    smoked <1  ppd off and on from mid 20s to mid 110s.   Vaping Use   Vaping status: Never Used  Substance and Sexual Activity   Alcohol use: Yes    Alcohol/week: 0.0 standard drinks of alcohol    Comment: moderate   Drug use: No   Sexual activity: Not Currently  Other Topics Concern   Not on file  Social History Narrative   Not on file   Social Drivers  of Health   Financial Resource Strain: Patient Declined (08/03/2023)   Received from [facility]   Overall Financial Resource Strain (CARDIA)    Difficulty of Paying Living Expenses: Patient declined  Food Insecurity: No Food Insecurity (07/21/2024)   Hunger Vital Sign    Worried About Running Out of Food in the Last Year: Never true    Ran Out of Food in the Last Year: Never true  Transportation Needs: No Transportation Needs (07/21/2024)   PRAPARE - Administrator, Civil Service  (Medical): No    Lack of Transportation (Non-Medical): No  Physical Activity: Not on file  Stress: Not on file  Social Connections: Not on file  Intimate Partner Violence: Not At Risk (07/21/2024)   Humiliation, Afraid, Rape, and Kick questionnaire    Fear of Current or Ex-Partner: No    Emotionally Abused: No    Physically Abused: No    Sexually Abused: No    FAMILY HISTORY: Family History  Problem Relation Age of Onset   Prostate cancer Father    Cancer Father        bone, prostate   COPD Mother    Cancer Mother        lung   Cancer Maternal Grandmother        brain   Aneurysm Paternal Grandmother    Prostate cancer Brother    Prostate cancer Brother    Kidney disease Neg Hx    Bladder Cancer Neg Hx     ALLERGIES:  is allergic to apixaban  and losartan.  MEDICATIONS:  Current Outpatient Medications  Medication Sig Dispense Refill   albuterol  (VENTOLIN  HFA) 108 (90 Base) MCG/ACT inhaler Inhale 2 puffs into the lungs every 6 (six) hours as needed for wheezing or shortness of breath. 8 g 2   budesonide -formoterol  (SYMBICORT ) 80-4.5 MCG/ACT inhaler Inhale 2 puffs into the lungs 2 (two) times daily.     levocetirizine (XYZAL  ALLERGY 24HR) 5 MG tablet Take 1 tablet (5 mg total) by mouth every evening. Home med.     rivaroxaban  (XARELTO ) 20 MG TABS tablet Take 1 tablet (20 mg total) by mouth daily with supper. 90 tablet 3   triamcinolone  cream (KENALOG ) 0.5 % Apply 1 Application topically 2 (two) times daily.     No current facility-administered medications for this visit.       PHYSICAL EXAMINATION:  Vitals:   08/11/24 1455  BP: (!) 160/96  Pulse: 77  Resp: 18  Temp: (!) 97.3 F (36.3 C)  SpO2: 99%   Filed Weights   08/11/24 1455  Weight: 175 lb (79.4 kg)    Physical Exam Vitals and nursing note reviewed.  HENT:     Head: Normocephalic and atraumatic.     Mouth/Throat:     Pharynx: Oropharynx is clear.  Eyes:     Extraocular Movements: Extraocular  movements intact.     Pupils: Pupils are equal, round, and reactive to light.  Cardiovascular:     Rate and Rhythm: Normal rate and regular rhythm.  Pulmonary:     Comments: Decreased breath sounds bilaterally.  Abdominal:     Palpations: Abdomen is soft.  Musculoskeletal:        General: Normal range of motion.     Cervical back: Normal range of motion.  Skin:    General: Skin is warm.  Neurological:     General: No focal deficit present.     Mental Status: He is alert and oriented to person, place,  and time.  Psychiatric:        Behavior: Behavior normal.        Judgment: Judgment normal.      LABORATORY DATA:  I have reviewed the data as listed Lab Results  Component Value Date   WBC 7.4 06/27/2024   HGB 12.8 (L) 06/27/2024   HCT 38.5 (L) 06/27/2024   MCV 97.2 06/27/2024   PLT 233 06/27/2024   Recent Labs    06/20/24 1442 06/22/24 0134 06/22/24 1515 06/23/24 0503  NA 139 139 133* 138  K 4.1 4.0 4.1 4.0  CL 102 109 103 107  CO2 28 23 23 23   GLUCOSE 114* 198* 121* 144*  BUN 16 23 21 23   CREATININE 0.79 0.78 0.74 0.82  CALCIUM  9.5 8.5* 9.1 9.1  GFRNONAA >60 >60 >60 >60  PROT 6.4*  --  6.5 6.1*  ALBUMIN 4.0  --  3.7 3.4*  AST 22  --  18 18  ALT 29  --  21 18  ALKPHOS 38  --  39 36*  BILITOT 1.2  --  0.7 0.4    RADIOGRAPHIC STUDIES: I have personally reviewed the radiological images as listed and agreed with the findings in the report. VAS US  LOWER EXTREMITY VENOUS (DVT) Result Date: 07/26/2024  Lower Venous DVT Study Patient Name:  Benjamin Ford  Date of Exam:   07/25/2024 Medical Rec #: [id number]         Accession #:    [id number] Date of Birth: 05/27/1961         Patient Gender: M Patient Age:   31 years Exam Location:  [facility] Procedure:      VAS US  LOWER EXTREMITY VENOUS (DVT) Referring Phys: CORDELLA SHAWL --------------------------------------------------------------------------------  Indications: Swelling, Edema, and Pain.  Risk  Factors: Confirmed PE DVT Known right leg from CFV confluence into calf. Anticoagulation: Xarelto . Performing Technologist: Donnice Charnley RVT  Examination Guidelines: A complete evaluation includes B-mode imaging, spectral Doppler, color Doppler, and power Doppler as needed of all accessible portions of each vessel. Bilateral testing is considered an integral part of a complete examination. Limited examinations for reoccurring indications may be performed as noted. The reflux portion of the exam is performed with the patient in reverse Trendelenburg.  +---------+---------------+---------+-----------+---------------+--------------+ RIGHT    CompressibilityPhasicitySpontaneityProperties     Thrombus Aging +---------+---------------+---------+-----------+---------------+--------------+ CFV      Full           Yes      Yes                                      +---------+---------------+---------+-----------+---------------+--------------+ SFJ      Full           Yes      Yes                                      +---------+---------------+---------+-----------+---------------+--------------+ FV Prox  None           No       No         rigid          Age  w/compression  Indeterminate  +---------+---------------+---------+-----------+---------------+--------------+ FV Mid   None           No       No         rigid          Age                                                        w/compression  Indeterminate  +---------+---------------+---------+-----------+---------------+--------------+ FV DistalNone           No       No         rigid          Age                                                        w/compression  Indeterminate  +---------+---------------+---------+-----------+---------------+--------------+ PFV      Full           Yes      Yes                                       +---------+---------------+---------+-----------+---------------+--------------+ POP      None           No       Yes        partially      Age                                                        re-cannalized  Indeterminate  +---------+---------------+---------+-----------+---------------+--------------+ PTV      Full                                                             +---------+---------------+---------+-----------+---------------+--------------+ GSV      Full                                                             +---------+---------------+---------+-----------+---------------+--------------+ SSV      Full                                                             +---------+---------------+---------+-----------+---------------+--------------+ 1 of 2 pared calf veins is also dilated and thrombosed.  +----+---------------+---------+-----------+----------+--------------+ LEFTCompressibilityPhasicitySpontaneityPropertiesThrombus Aging +----+---------------+---------+-----------+----------+--------------+ CFV Full  Yes      Yes                                 +----+---------------+---------+-----------+----------+--------------+    Summary: RIGHT: - Findings consistent with age indeterminate deep vein thrombosis involving the right femoral vein, right popliteal vein, and calf vein.  - Thrombus extend from proximal femoral vein just distal to the confluence through the popliteal vein into the calf veins.  LEFT: - No evidence of common femoral vein obstruction.   *See table(s) above for measurements and observations. Electronically signed by Cordella Shawl MD on 07/26/2024 at 1:36:28 PM.    Final     ASSESSMENT & PLAN:   Acute pulmonary embolism with acute cor pulmonale (HCC) # prior history of right lower extremity DVT possible provoked [in 2024]; JULY 15th, 2025-RIGHT LE_ acute DVT- CTA-  positive for bilateral acute pulmonary  thromboembolic disease with lobar involvement bilaterally, greater on the right. There is evidence for mild right ventricular strain ) consistent with at least submassive (intermediate risk) PE.  Status post thrombectomy [Dr.Schneir].  August 2025-prothrombin gene mutation and factor V Leiden; protein C and S within normal limits.   # However antiphospholipid antibody panel-positive; anticardiolipin/antiphospholipid negative.  Since this is not triple positive disease-I think it is reasonable to continue Xarelto . Patient currently tolerating xarelto  20 mg/day [vascular-? Reaction to Eliquis ] well.  Patient will need indefinite anticoagulation.  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  Recommend evaluation with vascular for further recommendations  # Elevated Blood Pressure: Whitecoat syndrome-monitor closely.  # DISPOSITION: # follow up in 6 months-  MD:2 weeks- PRIOR- labs- cbc/bmp-APS; anticardiolipin antibodies- Dr.B      Cindy JONELLE Joe, MD 08/11/2024 3:41 PM

## 2024-08-11 NOTE — Progress Notes (Signed)
 Rt leg swells, no pain.

## 2024-08-11 NOTE — Assessment & Plan Note (Addendum)
#   prior history of right lower extremity DVT possible provoked [in 2024]; JULY 15th, 2025-RIGHT LE_ acute DVT- CTA-  positive for bilateral acute pulmonary thromboembolic disease with lobar involvement bilaterally, greater on the right. There is evidence for mild right ventricular strain ) consistent with at least submassive (intermediate risk) PE.  Status post thrombectomy [Dr.Schneir].  August 2025-prothrombin gene mutation and factor V Leiden; protein C and S within normal limits.   # However antiphospholipid antibody panel-positive; anticardiolipin/antiphospholipid negative.  Since this is not triple positive disease-I think it is reasonable to continue Xarelto . Patient currently tolerating xarelto  20 mg/day [vascular-? Reaction to Eliquis ] well.  Patient will need indefinite anticoagulation.  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  Recommend evaluation with vascular for further recommendations  # Elevated Blood Pressure: Whitecoat syndrome-monitor closely.  # DISPOSITION: # follow up in 6 months-  MD:2 weeks- PRIOR- labs- cbc/bmp-APS; anticardiolipin antibodies- Dr.B

## 2024-08-21 ENCOUNTER — Ambulatory Visit: Admitting: Urology

## 2024-08-27 NOTE — Progress Notes (Unsigned)
 08/28/24 1:04 PM   Benjamin Ford 1961-10-12 [id number]  Referring provider:  Valora Agent, MD [address] [place],  [place] [address]  Urological history: 1. Prostate cancer screening  -PSA pending  -baseline PSA (2015) 0.6 at age 63 -Brother and father diagnosed in their 33's   2. BPH   3. Epididymal cysts  -scrotal ultrasound 2015 multiple simple appearing cystic structures right epididymis likely benign cyst.  Chief Complaint  Patient presents with   Benign Prostatic Hypertrophy    HPI: Benjamin Ford is a 63 y.o.male who presents today for a 6 month follow-up.  Previous records reviewed.   I PSS 1/0  He reports no sensation of incomplete bladder emptying, urinary frequency, urinary intermittency, urinary urgency, a weak urinary stream, or having to strain to void.  Nocturia x 1, He is not leaking before being able to reach the restroom, not leaking with coughing, not leaking without awareness, and no post void dribbling.   Patient denies any modifying or aggravating factors.  Patient denies any recent UTI's, gross hematuria, dysuria or suprapubic/flank pain.  Patient denies any fevers, chills, nausea or vomiting.   He has a family history of PCa.     UA (08/2024) bland  PSA  pending   Serum creatinine (08/2024) 0.8, eGFR 99  SHIM 25  Patient still having spontaneous erections.  He denies any pain or curvature with erections.    Cholesterol (08/2024) 187  PMH: Past Medical History:  Diagnosis Date   Anxiety    Asthma    BPH (benign prostatic hyperplasia)    GERD (gastroesophageal reflux disease)    HTN (hypertension)    Insomnia    Palpitations    Stomach ulcer    Testicular mass     Surgical History: Past Surgical History:  Procedure Laterality Date   COLONOSCOPY WITH PROPOFOL  N/A 05/27/2020   Procedure: COLONOSCOPY WITH PROPOFOL ;  Surgeon: Therisa Bi, MD;  Location: [facility] ENDOSCOPY;  Service: Gastroenterology;   Laterality: N/A;   INGUINAL HERNIA REPAIR  2008   MASTECTOMY Bilateral 1992/1981   PULMONARY THROMBECTOMY N/A 06/21/2024   Procedure: PULMONARY THROMBECTOMY;  Surgeon: Jama Cordella MATSU, MD;  Location: [facility] INVASIVE CV LAB;  Service: Cardiovascular;  Laterality: N/A;   VASECTOMY  1983    Home Medications:  Allergies as of 08/28/2024       Reactions   Apixaban  Swelling   Caused lip swelling. Has taken xarelto  in the past with no complications.   Losartan Swelling        Medication List        Accurate as of August 28, 2024  1:04 PM. If you have any questions, ask your nurse or doctor.          albuterol  108 (90 Base) MCG/ACT inhaler Commonly known as: VENTOLIN  HFA Inhale 2 puffs into the lungs every 6 (six) hours as needed for wheezing or shortness of breath.   budesonide -formoterol  80-4.5 MCG/ACT inhaler Commonly known as: SYMBICORT  Inhale 2 puffs into the lungs 2 (two) times daily.   levocetirizine 5 MG tablet Commonly known as: Xyzal  Allergy 24HR Take 1 tablet (5 mg total) by mouth every evening. Home med.   rivaroxaban  20 MG Tabs tablet Commonly known as: XARELTO  Take 1 tablet (20 mg total) by mouth daily with supper.   triamcinolone  cream 0.5 % Commonly known as: KENALOG  Apply 1 Application topically 2 (two) times daily.        Allergies:  Allergies  Allergen  Reactions   Apixaban  Swelling    Caused lip swelling. Has taken xarelto  in the past with no complications.   Losartan Swelling    Family History: Family History  Problem Relation Age of Onset   Prostate cancer Father    Cancer Father        bone, prostate   COPD Mother    Cancer Mother        lung   Cancer Maternal Grandmother        brain   Aneurysm Paternal Grandmother    Prostate cancer Brother    Prostate cancer Brother    Kidney disease Neg Hx    Bladder Cancer Neg Hx     Social History:  reports that he quit smoking about 26 years ago. His smoking use included cigarettes.  He started smoking about 40 years ago. He has a 14 pack-year smoking history. He has never used smokeless tobacco. He reports current alcohol use. He reports that he does not use drugs.   Physical Exam: BP (!) 186/83 (BP Location: Left Arm, Patient Position: Sitting, Cuff Size: Normal)   Pulse 69   Wt 175 lb (79.4 kg)   SpO2 100%   BMI 28.25 kg/m   Constitutional:  Well nourished. Alert and oriented, No acute distress. HEENT: [place] AT, moist mucus membranes.  Trachea midline Cardiovascular: No clubbing, cyanosis, or edema. Respiratory: Normal respiratory effort, no increased work of breathing. GU: No CVA tenderness.  No bladder fullness or masses.  Patient with circumcised phallus.  Urethral meatus is patent.  No penile discharge. No penile lesions or rashes. Scrotum without lesions, cysts, rashes and/or edema.  Testicles are located scrotally bilaterally. No masses are appreciated in the testicles. Left and right epididymis are normal. Large right spermatocele noted.  Rectal: Patient with  normal sphincter tone. Anus and perineum without scarring or rashes. No rectal masses are appreciated. Prostate is approximately 45 grams, could not palpate the entire gland, no nodules are appreciated. Seminal vesicles could not be palpated.  Neurologic: Grossly intact, no focal deficits, moving all 4 extremities. Psychiatric: Normal mood and affect.   Laboratory Data: PSA pending I have reviewed the labs.   Pertinent Imaging: N/A  Assessment & Plan:    1. BPH with LUTS -PSA pending -DRE benign -continue conservative management, avoiding bladder irritants and timed voiding's    2. Prostate cancer screening -father and two brother both diagnosed with prostate cancer's in their 86's   Return in about 6 months (around 02/25/2025) for PSA/DRE .  Benjamin Ford   [facility] [address], [address] [place], [place] [address] [phone]

## 2024-08-28 ENCOUNTER — Other Ambulatory Visit
Admission: RE | Admit: 2024-08-28 | Discharge: 2024-08-28 | Disposition: A | Discharge status: Home / Self Care | Attending: Urology | Admitting: Urology

## 2024-08-28 ENCOUNTER — Encounter: Payer: Self-pay | Admitting: Urology

## 2024-08-28 ENCOUNTER — Ambulatory Visit: Payer: Self-pay | Admitting: Urology

## 2024-08-28 ENCOUNTER — Other Ambulatory Visit: Payer: Self-pay

## 2024-08-28 ENCOUNTER — Ambulatory Visit: Admitting: Urology

## 2024-08-28 VITALS — BP 186/83 | HR 69 | Wt 175.0 lb

## 2024-08-28 DIAGNOSIS — N401 Enlarged prostate with lower urinary tract symptoms: Secondary | ICD-10-CM

## 2024-08-28 DIAGNOSIS — Z8042 Family history of malignant neoplasm of prostate: Secondary | ICD-10-CM

## 2024-08-28 DIAGNOSIS — N138 Other obstructive and reflux uropathy: Secondary | ICD-10-CM

## 2024-08-28 LAB — PSA: Prostatic Specific Antigen: 0.94 ng/mL (ref 0.00–4.00)

## 2024-09-18 ENCOUNTER — Other Ambulatory Visit (INDEPENDENT_AMBULATORY_CARE_PROVIDER_SITE_OTHER): Payer: Self-pay | Admitting: Nurse Practitioner

## 2024-09-18 DIAGNOSIS — I2609 Other pulmonary embolism with acute cor pulmonale: Secondary | ICD-10-CM

## 2024-09-19 ENCOUNTER — Ambulatory Visit (INDEPENDENT_AMBULATORY_CARE_PROVIDER_SITE_OTHER)

## 2024-09-19 ENCOUNTER — Encounter (INDEPENDENT_AMBULATORY_CARE_PROVIDER_SITE_OTHER): Payer: Self-pay | Admitting: Vascular Surgery

## 2024-09-19 ENCOUNTER — Ambulatory Visit (INDEPENDENT_AMBULATORY_CARE_PROVIDER_SITE_OTHER): Admitting: Vascular Surgery

## 2024-09-19 VITALS — BP 190/94 | HR 62 | Resp 18 | Wt 173.2 lb

## 2024-09-19 DIAGNOSIS — I824Y1 Acute embolism and thrombosis of unspecified deep veins of right proximal lower extremity: Secondary | ICD-10-CM

## 2024-09-19 DIAGNOSIS — I2609 Other pulmonary embolism with acute cor pulmonale: Secondary | ICD-10-CM

## 2024-09-19 DIAGNOSIS — I10 Essential (primary) hypertension: Secondary | ICD-10-CM | POA: Diagnosis not present

## 2024-09-19 NOTE — Assessment & Plan Note (Signed)
 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 At this point, we will continue full dose anticoagulation for at least 1 year and may be long-term.  Prophylactic anticoagulants could be considered at 1 year which would be reducing the dose of Xarelto .  We will plan on seeing him back in about 6 months to discuss options.  Compression and elevation for any postphlebitic symptoms.

## 2024-09-19 NOTE — Progress Notes (Signed)
 MRN : [id number]  Benjamin Ford is a 63 y.o. (Apr 08, 1961) male who presents with chief complaint of  Chief Complaint  Patient presents with   Follow-up    8 weeks dvt  .  History of Present Illness: Patient returns today in follow up of his DVT and pulmonary embolus.  About 3 months ago, he underwent pulmonary thrombectomy with good results.  No current chest pain or shortness of breath.  He had significant residual right lower extremity DVT treated with anticoagulation.  His leg swelling and pain has gone down significantly.  He only has very mild lower leg swelling at this point.  No problems with anticoagulation.  This was a second episode without obvious provocation so we are planning long-term anticoagulation.  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  Current Outpatient Medications  Medication Sig Dispense Refill   albuterol  (VENTOLIN  HFA) 108 (90 Base) MCG/ACT inhaler Inhale 2 puffs into the lungs every 6 (six) hours as needed for wheezing or shortness of breath. 8 g 2   budesonide -formoterol  (SYMBICORT ) 80-4.5 MCG/ACT inhaler Inhale 2 puffs into the lungs 2 (two) times daily.     levocetirizine (XYZAL  ALLERGY 24HR) 5 MG tablet Take 1 tablet (5 mg total) by mouth every evening. Home med.     rivaroxaban  (XARELTO ) 20 MG TABS tablet Take 1 tablet (20 mg total) by mouth daily with supper. 90 tablet 3   triamcinolone  cream (KENALOG ) 0.5 % Apply 1 Application topically 2 (two) times daily.     No current facility-administered medications for this visit.    Past Medical History:  Diagnosis Date   Anxiety    Asthma    BPH (benign prostatic hyperplasia)    GERD (gastroesophageal reflux disease)    HTN (hypertension)    Insomnia    Palpitations    Stomach ulcer    Testicular mass     Past Surgical History:  Procedure Laterality Date   COLONOSCOPY WITH PROPOFOL  N/A  05/27/2020   Procedure: COLONOSCOPY WITH PROPOFOL ;  Surgeon: Therisa Bi, MD;  Location: [facility] ENDOSCOPY;  Service: Gastroenterology;  Laterality: N/A;   INGUINAL HERNIA REPAIR  2008   MASTECTOMY Bilateral 1992/1981   PULMONARY THROMBECTOMY N/A 06/21/2024   Procedure: PULMONARY THROMBECTOMY;  Surgeon: Jama Cordella MATSU, MD;  Location: [facility] INVASIVE CV LAB;  Service: Cardiovascular;  Laterality: N/A;   VASECTOMY  1983     Social History   Tobacco Use   Smoking status: Former    Current packs/day: 0.00    Average packs/day: 1 pack/day for 14.0 years (14.0 ttl pk-yrs)    Types: Cigarettes    Start date: 30    Quit date: 1999    Years since quitting: 26.8   Smokeless tobacco: Never   Tobacco comments:    smoked <1  ppd off and on from mid 20s to mid 31s.   Vaping Use   Vaping status: Never Used  Substance Use Topics   Alcohol use: Yes    Alcohol/week: 0.0 standard drinks of alcohol    Comment: moderate   Drug use: No       Family History  Problem Relation Age of Onset   Prostate cancer Father    Cancer Father        bone, prostate   COPD Mother    Cancer Mother        lung   Cancer Maternal Grandmother  brain   Aneurysm Paternal Grandmother    Prostate cancer Brother    Prostate cancer Brother    Kidney disease Neg Hx    Bladder Cancer Neg Hx      Allergies  Allergen Reactions   Apixaban  Swelling    Caused lip swelling. Has taken xarelto  in the past with no complications.   Losartan Swelling     REVIEW OF SYSTEMS (Negative unless checked)  Constitutional: [] Weight loss  [] Fever  [] Chills Cardiac: [] Chest pain   [] Chest pressure   [] Palpitations   [] Shortness of breath when laying flat   [] Shortness of breath at rest   [] Shortness of breath with exertion. Vascular:  [] Pain in legs with walking   [] Pain in legs at rest   [] Pain in legs when laying flat   [] Claudication   [] Pain in feet when walking  [] Pain in feet at rest  [] Pain in feet when laying  flat   [] History of DVT   [x] Phlebitis   [x] Swelling in legs   [] Varicose veins   [] Non-healing ulcers Pulmonary:   [] Uses home oxygen   [] Productive cough   [] Hemoptysis   [] Wheeze  [] COPD   [x] Asthma Neurologic:  [] Dizziness  [] Blackouts   [] Seizures   [] History of stroke   [] History of TIA  [] Aphasia   [] Temporary blindness   [] Dysphagia   [] Weakness or numbness in arms   [] Weakness or numbness in legs Musculoskeletal:  [] Arthritis   [] Joint swelling   [] Joint pain   [] Low back pain Hematologic:  [] Easy bruising  [] Easy bleeding   [] Hypercoagulable state   [] Anemic   Gastrointestinal:  [] Blood in stool   [] Vomiting blood  [x] Gastroesophageal reflux/heartburn   [] Abdominal pain Genitourinary:  [] Chronic kidney disease   [] Difficult urination  [] Frequent urination  [] Burning with urination   [] Hematuria Skin:  [] Rashes   [] Ulcers   [] Wounds Psychological:  [] History of anxiety   []  History of major depression.  Physical Examination  BP (!) 190/94   Pulse 62   Resp 18   Wt 173 lb 3.2 oz (78.6 kg)   BMI 27.96 kg/m  Gen:  WD/WN, NAD. Appears younger than stated age. Head: [place]/AT, No temporalis wasting. Ear/Nose/Throat: Hearing grossly intact, nares w/o erythema or drainage Eyes: Conjunctiva clear. Sclera non-icteric Neck: Supple.  Trachea midline Pulmonary:  Good air movement, no use of accessory muscles.  Cardiac: RRR, no JVD Vascular:  Vessel Right Left  Radial Palpable Palpable                          PT Palpable Palpable  DP Palpable Palpable   Gastrointestinal: soft, non-tender/non-distended. No guarding/reflex.  Musculoskeletal: M/S 5/5 throughout.  No deformity or atrophy. Trace RLE edema. Neurologic: Sensation grossly intact in extremities.  Symmetrical.  Speech is fluent.  Psychiatric: Judgment intact, Mood & affect appropriate for pt's clinical situation. Dermatologic: No rashes or ulcers noted.  No cellulitis or open wounds.      Labs Recent Results (from the  past 2160 hours)  Basic metabolic panel     Status: Abnormal   Collection Time: 06/22/24  1:34 AM  Result Value Ref Range   Sodium 139 135 - 145 mmol/L   Potassium 4.0 3.5 - 5.1 mmol/L   Chloride 109 98 - 111 mmol/L   CO2 23 22 - 32 mmol/L   Glucose, Bld 198 (H) 70 - 99 mg/dL    Comment: Glucose reference range applies only to samples taken after fasting for  at least 8 hours.   BUN 23 8 - 23 mg/dL   Creatinine, Ser 9.21 0.61 - 1.24 mg/dL   Calcium  8.5 (L) 8.9 - 10.3 mg/dL   GFR, Estimated >39 >39 mL/min    Comment: (NOTE) Calculated using the CKD-EPI Creatinine Equation (2021)    Anion gap 7 5 - 15    Comment: Performed at [facility], [address] [place], [place] [address]  Heparin  level (unfractionated)     Status: None   Collection Time: 06/22/24  1:34 AM  Result Value Ref Range   Heparin  Unfractionated 0.40 0.30 - 0.70 IU/mL    Comment: (NOTE) The clinical reportable range upper limit is being lowered to >1.10 to align with the FDA approved guidance for the current laboratory assay.  If heparin  results are below expected values, and patient dosage has  been confirmed, suggest follow up testing of antithrombin III levels. Performed at [facility], [address] [place], [place] [address]   CBC     Status: Abnormal   Collection Time: 06/22/24  1:34 AM  Result Value Ref Range   WBC 6.9 4.0 - 10.5 K/uL   RBC 3.94 (L) 4.22 - 5.81 MIL/uL   Hemoglobin 13.3 13.0 - 17.0 g/dL   HCT 61.6 (L) 60.9 - 47.9 %   MCV 97.2 80.0 - 100.0 fL   MCH 33.8 26.0 - 34.0 pg   MCHC 34.7 30.0 - 36.0 g/dL   RDW 86.4 88.4 - 84.4 %   Platelets 188 150 - 400 K/uL   nRBC 0.0 0.0 - 0.2 %    Comment: Performed at [facility], [address] [place], [place] [address]  CBC with Differential     Status: Abnormal   Collection Time: 06/22/24  3:15 PM  Result Value Ref Range   WBC 9.3 4.0 - 10.5 K/uL   RBC 4.14 (L) 4.22 - 5.81 MIL/uL   Hemoglobin 13.3 13.0 - 17.0  g/dL   HCT 59.5 60.9 - 47.9 %   MCV 97.6 80.0 - 100.0 fL   MCH 32.1 26.0 - 34.0 pg   MCHC 32.9 30.0 - 36.0 g/dL   RDW 86.4 88.4 - 84.4 %   Platelets 205 150 - 400 K/uL   nRBC 0.0 0.0 - 0.2 %   Neutrophils Relative % 91 %   Neutro Abs 8.4 (H) 1.7 - 7.7 K/uL   Lymphocytes Relative 6 %   Lymphs Abs 0.5 (L) 0.7 - 4.0 K/uL   Monocytes Relative 2 %   Monocytes Absolute 0.2 0.1 - 1.0 K/uL   Eosinophils Relative 1 %   Eosinophils Absolute 0.1 0.0 - 0.5 K/uL   Basophils Relative 0 %   Basophils Absolute 0.0 0.0 - 0.1 K/uL   Immature Granulocytes 0 %   Abs Immature Granulocytes 0.03 0.00 - 0.07 K/uL    Comment: Performed at [facility], [address] [place], [place] [address]  Comprehensive metabolic panel     Status: Abnormal   Collection Time: 06/22/24  3:15 PM  Result Value Ref Range   Sodium 133 (L) 135 - 145 mmol/L   Potassium 4.1 3.5 - 5.1 mmol/L   Chloride 103 98 - 111 mmol/L   CO2 23 22 - 32 mmol/L   Glucose, Bld 121 (H) 70 - 99 mg/dL    Comment: Glucose reference range applies only to samples taken after fasting for at least 8 hours.   BUN 21 8 - 23 mg/dL   Creatinine,  Ser 0.74 0.61 - 1.24 mg/dL   Calcium  9.1 8.9 - 10.3 mg/dL   Total Protein 6.5 6.5 - 8.1 g/dL   Albumin 3.7 3.5 - 5.0 g/dL   AST 18 15 - 41 U/L   ALT 21 0 - 44 U/L   Alkaline Phosphatase 39 38 - 126 U/L   Total Bilirubin 0.7 0.0 - 1.2 mg/dL   GFR, Estimated >39 >39 mL/min    Comment: (NOTE) Calculated using the CKD-EPI Creatinine Equation (2021)    Anion gap 7 5 - 15    Comment: Performed at [facility], [address] [place], [place] [address]  Comprehensive metabolic panel     Status: Abnormal   Collection Time: 06/23/24  5:03 AM  Result Value Ref Range   Sodium 138 135 - 145 mmol/L   Potassium 4.0 3.5 - 5.1 mmol/L   Chloride 107 98 - 111 mmol/L   CO2 23 22 - 32 mmol/L   Glucose, Bld 144 (H) 70 - 99 mg/dL    Comment: Glucose reference range applies only to samples taken  after fasting for at least 8 hours.   BUN 23 8 - 23 mg/dL   Creatinine, Ser 9.17 0.61 - 1.24 mg/dL   Calcium  9.1 8.9 - 10.3 mg/dL   Total Protein 6.1 (L) 6.5 - 8.1 g/dL   Albumin 3.4 (L) 3.5 - 5.0 g/dL   AST 18 15 - 41 U/L   ALT 18 0 - 44 U/L   Alkaline Phosphatase 36 (L) 38 - 126 U/L   Total Bilirubin 0.4 0.0 - 1.2 mg/dL   GFR, Estimated >39 >39 mL/min    Comment: (NOTE) Calculated using the CKD-EPI Creatinine Equation (2021)    Anion gap 8 5 - 15    Comment: Performed at [facility], [address] [place], [place] [address]  CBC     Status: Abnormal   Collection Time: 06/23/24  5:03 AM  Result Value Ref Range   WBC 10.1 4.0 - 10.5 K/uL   RBC 4.04 (L) 4.22 - 5.81 MIL/uL   Hemoglobin 13.3 13.0 - 17.0 g/dL   HCT 60.4 60.9 - 47.9 %   MCV 97.8 80.0 - 100.0 fL   MCH 32.9 26.0 - 34.0 pg   MCHC 33.7 30.0 - 36.0 g/dL   RDW 86.5 88.4 - 84.4 %   Platelets 232 150 - 400 K/uL   nRBC 0.0 0.0 - 0.2 %    Comment: Performed at [facility], [address] [place], [place] [address]  Protime-INR     Status: None   Collection Time: 06/23/24  5:03 AM  Result Value Ref Range   Prothrombin Time 15.2 11.4 - 15.2 seconds   INR 1.1 0.8 - 1.2    Comment: (NOTE) INR goal varies based on device and disease states. Performed at [facility], [address] [place], [place] [address]   CBC     Status: Abnormal   Collection Time: 06/24/24  9:23 AM  Result Value Ref Range   WBC 8.9 4.0 - 10.5 K/uL   RBC 4.06 (L) 4.22 - 5.81 MIL/uL   Hemoglobin 13.3 13.0 - 17.0 g/dL   HCT 60.8 60.9 - 47.9 %   MCV 96.3 80.0 - 100.0 fL   MCH 32.8 26.0 - 34.0 pg   MCHC 34.0 30.0 - 36.0 g/dL   RDW 86.5 88.4 - 84.4 %   Platelets 210 150 - 400 K/uL   nRBC 0.0 0.0 - 0.2 %  Comment: Performed at [facility], [address] [place], [place] [address]  APTT     Status: Abnormal   Collection Time: 06/24/24  2:42 PM  Result Value Ref Range   aPTT 45 (H) 24 - 36 seconds     Comment:        IF BASELINE aPTT IS ELEVATED, SUGGEST PATIENT RISK ASSESSMENT BE USED TO DETERMINE APPROPRIATE ANTICOAGULANT THERAPY. Performed at [facility], [address] [place], [place] [address]   APTT     Status: Abnormal   Collection Time: 06/24/24  9:40 PM  Result Value Ref Range   aPTT 49 (H) 24 - 36 seconds    Comment:        IF BASELINE aPTT IS ELEVATED, SUGGEST PATIENT RISK ASSESSMENT BE USED TO DETERMINE APPROPRIATE ANTICOAGULANT THERAPY. Performed at [facility], [address] [place], [place] [address]   CBC     Status: Abnormal   Collection Time: 06/25/24  5:46 AM  Result Value Ref Range   WBC 7.8 4.0 - 10.5 K/uL   RBC 3.83 (L) 4.22 - 5.81 MIL/uL   Hemoglobin 12.6 (L) 13.0 - 17.0 g/dL   HCT 63.4 (L) 60.9 - 47.9 %   MCV 95.3 80.0 - 100.0 fL   MCH 32.9 26.0 - 34.0 pg   MCHC 34.5 30.0 - 36.0 g/dL   RDW 86.7 88.4 - 84.4 %   Platelets 204 150 - 400 K/uL   nRBC 0.0 0.0 - 0.2 %    Comment: Performed at [facility], [address] [place], [place] [address]  APTT     Status: Abnormal   Collection Time: 06/25/24  5:46 AM  Result Value Ref Range   aPTT 148 (H) 24 - 36 seconds    Comment:        IF BASELINE aPTT IS ELEVATED, SUGGEST PATIENT RISK ASSESSMENT BE USED TO DETERMINE APPROPRIATE ANTICOAGULANT THERAPY. Performed at [facility] [address] [place], [place] [address]   Heparin  level (unfractionated)     Status: Abnormal   Collection Time: 06/25/24  5:46 AM  Result Value Ref Range   Heparin  Unfractionated 1.08 (H) 0.30 - 0.70 IU/mL    Comment: (NOTE) The clinical reportable range upper limit is being lowered to >1.10 to align with the FDA approved guidance for the current laboratory assay.  If heparin  results are below expected values, and patient dosage has  been confirmed, suggest follow up testing of antithrombin III levels. Performed at [facility], [address]  [place], [place] [address]   APTT     Status: Abnormal   Collection Time: 06/25/24  2:15 PM  Result Value Ref Range   aPTT 77 (H) 24 - 36 seconds    Comment:        IF BASELINE aPTT IS ELEVATED, SUGGEST PATIENT RISK ASSESSMENT BE USED TO DETERMINE APPROPRIATE ANTICOAGULANT THERAPY. Performed at [facility], [address] [place], [place] [address]   APTT     Status: Abnormal   Collection Time: 06/25/24  7:45 PM  Result Value Ref Range   aPTT 68 (H) 24 - 36 seconds    Comment:        IF BASELINE aPTT IS ELEVATED, SUGGEST PATIENT RISK ASSESSMENT BE USED TO DETERMINE APPROPRIATE ANTICOAGULANT THERAPY. Performed at [facility], [address] [place], [place] [address]   Heparin  level (unfractionated)     Status: Abnormal   Collection Time: 06/26/24  6:17 AM  Result Value Ref Range   Heparin  Unfractionated 0.76 (H) 0.30 - 0.70 IU/mL    Comment: (NOTE) The clinical reportable range upper limit is being lowered to >1.10 to align with the FDA approved guidance for the current laboratory assay.  If heparin  results are below expected values, and patient dosage has  been confirmed, suggest follow up testing of antithrombin III levels. Performed at [facility], [address] [place], [place] [address]   CBC     Status: Abnormal   Collection Time: 06/26/24  6:17 AM  Result Value Ref Range   WBC 7.1 4.0 - 10.5 K/uL   RBC 3.78 (L) 4.22 - 5.81 MIL/uL   Hemoglobin 12.4 (L) 13.0 - 17.0 g/dL   HCT 63.3 (L) 60.9 - 47.9 %   MCV 96.8 80.0 - 100.0 fL   MCH 32.8 26.0 - 34.0 pg   MCHC 33.9 30.0 - 36.0 g/dL   RDW 86.9 88.4 - 84.4 %   Platelets 223 150 - 400 K/uL   nRBC 0.0 0.0 - 0.2 %    Comment: Performed at [facility], [address] [place], [place] [address]  APTT     Status: Abnormal   Collection Time: 06/26/24  6:17 AM  Result Value Ref Range   aPTT 117 (H) 24 - 36 seconds    Comment:        IF BASELINE aPTT IS ELEVATED, SUGGEST  PATIENT RISK ASSESSMENT BE USED TO DETERMINE APPROPRIATE ANTICOAGULANT THERAPY. Performed at [facility], [address] [place], [place] [address]   APTT     Status: Abnormal   Collection Time: 06/26/24  1:24 PM  Result Value Ref Range   aPTT 67 (H) 24 - 36 seconds    Comment:        IF BASELINE aPTT IS ELEVATED, SUGGEST PATIENT RISK ASSESSMENT BE USED TO DETERMINE APPROPRIATE ANTICOAGULANT THERAPY. Performed at [facility], [address] [place], [place] [address]   APTT     Status: Abnormal   Collection Time: 06/26/24  7:56 PM  Result Value Ref Range   aPTT 78 (H) 24 - 36 seconds    Comment:        IF BASELINE aPTT IS ELEVATED, SUGGEST PATIENT RISK ASSESSMENT BE USED TO DETERMINE APPROPRIATE ANTICOAGULANT THERAPY. Performed at [facility], [address] [place], [place] [address]   Heparin  level (unfractionated)     Status: None   Collection Time: 06/27/24  3:14 AM  Result Value Ref Range   Heparin  Unfractionated 0.51 0.30 - 0.70 IU/mL    Comment: (NOTE) The clinical reportable range upper limit is being lowered to >1.10 to align with the FDA approved guidance for the current laboratory assay.  If heparin  results are below expected values, and patient dosage has  been confirmed, suggest follow up testing of antithrombin III levels. Performed at [facility], [address] [place], [place] [address]   CBC     Status: Abnormal   Collection Time: 06/27/24  3:14 AM  Result Value Ref Range   WBC 7.4 4.0 - 10.5 K/uL   RBC 3.96 (L) 4.22 - 5.81 MIL/uL   Hemoglobin 12.8 (L) 13.0 - 17.0 g/dL   HCT 61.4 (L) 60.9 - 47.9 %   MCV 97.2 80.0 - 100.0 fL   MCH 32.3 26.0 - 34.0 pg   MCHC 33.2 30.0 - 36.0 g/dL   RDW 86.9 88.4 - 84.4 %   Platelets 233 150 - 400  K/uL   nRBC 0.0 0.0 - 0.2 %    Comment: Performed at [facility], [address] [place], [place] [address]  APTT     Status: Abnormal   Collection Time:  06/27/24  3:14 AM  Result Value Ref Range   aPTT 70 (H) 24 - 36 seconds    Comment:        IF BASELINE aPTT IS ELEVATED, SUGGEST PATIENT RISK ASSESSMENT BE USED TO DETERMINE APPROPRIATE ANTICOAGULANT THERAPY. Performed at [facility], [address] [place], [place] [address]   Prothrombin gene mutation     Status: None   Collection Time: 07/21/24  2:39 PM  Result Value Ref Range   Recommendations-PTGENE: Comment     Comment: (NOTE) Result: c.*97G>A - Not Detected This result is not associated with an increased risk for venous thromboembolism. See Additional Clinical Information and Comments. Additional Clinical Information: Venous thromboembolism is a multifactorial disease influenced by genetic, environmental, and circumstantial risk factors. The c.*97G>A variant in the F2 gene is a genetic risk factor for venous thromboembolism. Heterozygous carriers have a 2- to 4-fold increased risk for venous thromboembolism. Homozygotes for the c.*97G>A variant are rare. The annual risk of VTE in homozygotes has been reported to be 1.1%/year. Individuals who carry both a c.*97G>A variant in the F2 gene and a c.1601G>A (p. Arg534Gln) variant in the F5 gene (commonly referred to as Factor V Leiden) have an approximately 20- fold increased risk for venous thromboembolism. Risks are likely to be even higher in more complex genotype combinations involving the F2 c.*97G>A variant and Factor V Leiden (PMID:  [id number]). Additional risk factors include but are not limited to: deficiency of protein C, protein S, or antithrombin III, age, male sex, personal or family history of deep vein thromboembolism, smoking, surgery, prolonged immobilization, malignant neoplasm, tamoxifen treatment, raloxifene treatment, oral contraceptive use, hormone replacement therapy, and pregnancy. Management of thrombotic risk and thrombotic events should follow established guidelines and fit the clinical  circumstance. This result cannot predict the occurrence or recurrence of a thrombotic event. Comments: Genetic counseling is recommended to discuss the potential clinical implications of positive results, as well as recommendations for testing family members. Genetic Coordinators are available for health care providers to discuss results at [phone]). Test Details: Variant analyzed: c.*97G>A, previously referred to as G20210A Methods/Limitations: DNA analysis of the F2 gene (NM_000 506.5) was performed by PCR amplification followed by restriction enzyme analysis. The diagnostic sensitivity is >99%. Results must be combined with clinical information for the most accurate interpretation. Molecular-based testing is highly accurate, but as in any laboratory test, diagnostic errors may occur. False positive or false negative results may occur for reasons that include genetic variants, blood transfusions, bone marrow transplantation, somatic or tissue-specific mosaicism, mislabeled samples, or erroneous representation of family relationships. This test was developed and its performance characteristics determined by [facility]. It has not been cleared or approved by the Food and Drug Administration. References: Bhatt S, Taylor AK, Lozano R, Gro[facility], Signa [facility]; [facility] Professional Practice and Guidelines Committee. Addendum: [employer] of [employer] consensus statement on factor V Leiden mutation testing. Genet Med. 2021 Mar 5. doi: 89.8961/d585 36-021-01108-x. PMID: 66325232. Hosey RUSH. Prothrombin Thrombophilia. 2006 Jul 25 [Updated 2021 Feb 4]. In: Juliene POSNER, Ardinger HH, Pagon RA, et al., editors. GeneReviews(R) [Internet]. [address] ([place]): [place] , [place]; 8006-7978. Available from: [email or web address].com/ Laurita GORMAN Waddell BRIDGETT, Huang X, Luo B, Spector EB, Ileana SHAUNNA Gal CS; [employer] Laboratory Quality Assurance Committee. Venous  thromboembolism laboratory testing (factor V Leiden and factor II c.*97G>A), 2018 update: a technical standard of the [employer] of [employer] and [employer] ([facility]). Genet Med. 2018 Dec;20(12):1489-1498. doi: 10.1038/[id number]-z. Epub 2018 Oct 5. PMID: 69702301.    Reviewed by: Comment     Comment: (NOTE) Technical Component performed at [facility] Professional Component performed by: Melissa A. Elberta, PhD, [facility] [id number], [facility], [address] [place] [address] Performed At: [facility] [address] [place], [place] [id number] Loran Gales MDPhD Ey:[phone]   Protein C activity     Status: None   Collection Time: 07/21/24  2:39 PM  Result Value Ref Range   Protein C Activity 97 73 - 180 %    Comment: (NOTE) Performed At: [facility] [address] [place], [place] [id number] Jennette Shorter MD Ey:[phone]   Protein S activity     Status: None   Collection Time: 07/21/24  2:39 PM  Result Value Ref Range   Protein S Activity 114 63 - 140 %    Comment: (NOTE) Protein S activity may be falsely increased (masking an abnormal, low result) in patients receiving direct Xa inhibitor (e.g., rivaroxaban , apixaban , edoxaban) or a direct thrombin inhibitor (e.g., dabigatran) anticoagulant treatment due to assay interference by these drugs. Performed At: [facility] [address] [place], [place] [id number] Jennette Shorter MD Ey:[phone]   Factor 5 leiden     Status: None   Collection Time: 07/21/24  2:39 PM  Result Value Ref Range   Recommendations-F5LEID: Comment     Comment: (NOTE) Result: c.1601G>A (p.Arg534Gln) - Not Detected This result is not associated with an increased risk for venous thromboembolism. See Additional Clinical Information and Comments. Additional Clinical Information:    Venous thromboembolism is a multifactorial disease influenced by genetic, environmental, and circumstantial risk factors. The c.1601G>A  (p. Arg534Gln) variant in the F5 gene, commonly referred to as Factor V Leiden, is a genetic risk factor for venous thromboembolism. Heterozygous carriers of this variant have a 6- to 8- fold increased risk for venous thromboembolism. Individuals homozygous for this variant (ie, with a copy of the variant on each chromosome) have an approximately 80-fold increased risk for venous thromboembolism. Individuals who carry both a c.*97G>A variant in the F2 gene and Factor V Leiden have an approximately 20-fold increased risk for venous thromboembolism. Risks are likely to be even higher in more complex genotype combinations  involving the F2 c.*97G>A variant and Factor V Leiden (PMID: 66325232). Additional risk factors include but are not limited to: deficiency of protein C, protein S, or antithrombin III, age, male sex, personal or family history of deep vein thromboembolism, smoking, surgery, prolonged immobilization, malignant neoplasm, tamoxifen treatment, raloxifene treatment, oral contraceptive use, hormone replacement therapy, and pregnancy. Management of thrombotic risk and thrombotic events should follow established guidelines and fit the clinical circumstance. This result cannot predict the occurrence or recurrence of a thrombotic event. Comment:    Genetic counseling is recommended to discuss the potential clinical implications of positive results, as well as recommendations for testing family members.    Genetic Coordinators are available for health care providers to discuss results at [phone]). Test Details:    Variant Analyzed: c.1601G>A (p. Arg534Gln), referre d to as Factor V Leiden Methods/Limitations:    DNA analysis of the F5 gene (NM_000130.5) was performed by PCR amplification followed by electrophoresis. The diagnostic sensitivity is >99%. Results must be combined with clinical information for the most accurate interpretation. Molecular-based testing is  highly accurate, but as in any laboratory test, diagnostic errors may occur. False positive or false negative results may occur for reasons that include genetic variants, blood transfusions, bone marrow transplantation, somatic or tissue-specific mosaicism, mislabeled samples, or erroneous representation of family relationships.    This test was developed and its performance characteristics determined by [facility]. It has not been cleared or approved by the Food and Drug Administration. References:    Bhatt S, Taylor AK, Lozano R, [facility], [facility]; [facility] Professional Practice and Guidelines Committee. Addendum: [employer] of Medical Genetics consensus statemen t on factor V Leiden mutation testing. Genet Med. 2021 Mar 5. doi: 89.8961/d58563-978- 01108-x. PMID: 66325232.    Hosey RUSH. Factor V Leiden Thrombophilia. 1999 May 14 (Updated 2018 Jan 4). In: Juliene POSNER, Ardinger HH, Pagon RA, et al., editors. GeneReviews(R) (Internet). [address] [facility]): [employer] , [place]; 8006-7978. Available from: https://harris-mcgee.org/    Laurita GORMAN Waddell BRIDGETT, Huang X, Luo B, Spector EB, Ileana SHAUNNA Gal CS; [facility] Laboratory Quality Assurance Committee. Venous thromboembolism laboratory testing (factor V Leiden and factor II c. *97G>A), 2018 update: a technical standard of the [employer] of [employer] and [employer] ([facility]). Genet Med. 2018 Izr;79(87): 8510-8501. doi: 10.1038/s41436-602 430 8825-z. Epub 2018 Oct 5. PMID: 69702301.    Reviewed By: Comment     Comment: (NOTE) Technical Component performed at [facility] Professional Component performed by: Larose Agent, PhD, [facility] [id number], [facility], [address] [place] [address] Performed At: [facility] [address] [place], [place] [id number] Loran Gales MDPhD Ey:[id number]   D-dimer, quantitative     Status: Abnormal   Collection Time: 07/21/24  2:39 PM  Result Value Ref Range   D-Dimer, Quant  0.76 (H) 0.00 - 0.50 ug/mL-FEU    Comment: (NOTE) At the manufacturer cut-off value of 0.5 g/mL FEU, this assay has a negative predictive value of 95-100%.This assay is intended for use in conjunction with a clinical pretest probability (PTP) assessment model to exclude pulmonary embolism (PE) and deep venous thrombosis (DVT) in outpatients suspected of PE or DVT. Results should be correlated with clinical presentation. Performed at [facility], [address] [place], [place] [address]   Beta-2 -glycoprotein i abs, IgG/M/A     Status: None   Collection Time: 07/21/24  2:39 PM  Result Value Ref Range   Beta-2  Glyco I IgG <9 0 - 20 GPI IgG units    Comment: (NOTE) The reference interval reflects a 3SD or 99th percentile interval, which is thought to represent a potentially clinically significant result in accordance with the International Consensus Statement on the classification criteria for definitive antiphospholipid syndrome (APS). J Thromb Haem 2006;4:295-306.    Beta-2 -Glycoprotein I IgM <9 0 - 32 GPI IgM units    Comment: (NOTE) The reference interval reflects a 3SD or 99th percentile interval, which is thought to represent a potentially clinically significant result in accordance with the International Consensus Statement on the classification criteria for definitive antiphospholipid syndrome (APS). J Thromb Haem 2006;4:295-306. Performed At: [facility] [address] [place], [place] [id number] Jennette Shorter MD Ey:[id number]    Beta-2 -Glycoprotein I IgA <9 0 - 25 GPI IgA units    Comment: (NOTE) The reference interval reflects a 3SD or 99th percentile interval, which is thought to represent a potentially clinically significant result in accordance with the International Consensus Statement on the classification criteria for definitive antiphospholipid syndrome (APS). J Thromb Haem 2006;4:295-306.   ANTIPHOSPHOLIPID SYNDROME PROF     Status:  Abnormal  Collection Time: 07/21/24  2:39 PM  Result Value Ref Range   Anticardiolipin IgG <9 0 - 14 GPL U/mL    Comment: (NOTE)                          Negative:              <15                          Indeterminate:     15 - 20                          Low-Med Positive: >20 - 80                          High Positive:         >80    Anticardiolipin IgM <9 0 - 12 MPL U/mL    Comment: (NOTE)                          Negative:              <13                          Indeterminate:     13 - 20                          Low-Med Positive: >20 - 80                          High Positive:         >80    PTT Lupus Anticoagulant 34.0 0.0 - 43.5 sec   DRVVT 90.3 (H) 0.0 - 47.0 sec   Lupus Anticoag Interp Comment:     Comment: (NOTE) 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 As only persistent lupus anticoagulant (LA) positivity meets laboratory diagnostic criteria for antiphospholipid syndrome, repeat testing in 12 or more weeks is recommended, ideally in the absence of anticoagulant therapy. Important Note: The results of LA testing are not valid for patients receiving heparin , direct Xa inhibitor (e.g., rivaroxaban , apixaban ) or direct thrombin inhibitor (e.g., dabigatran) therapy. These drugs may cause false positive LA results but will not interfere with anticardiolipin and beta-2  glycoprotein 1 antibody testing. Performed At: [facility] [address] [place], [place] [id number] Jennette Shorter MD Ey:[id number]   dRVVT Mix     Status: Abnormal   Collection Time: 07/21/24  2:39 PM  Result Value Ref Range   dRVVT Mix 62.5 (H) 0.0 - 40.4 sec    Comment: (NOTE) Performed At: [facility] [address] [place], [place] [id number] Jennette Shorter MD Ey:[id number]   dRVVT Confirm     Status: Abnormal   Collection Time: 07/21/24  2:39 PM  Result Value Ref Range   dRVVT Confirm 1.8 (H) 0.8 - 1.2 ratio    Comment: (NOTE) Performed  At: [facility] [address] [place], [place] [id number] Jennette Shorter MD Ey:[id number]   PSA     Status: None   Collection Time: 08/28/24 12:20 PM  Result Value Ref Range   Prostatic Specific Antigen 0.94 0.00 - 4.00 ng/mL    Comment: (NOTE) While PSA levels  of <=4.00 ng/ml are reported as reference range, some men with levels below 4.00 ng/ml can have prostate cancer and many men with PSA above 4.00 ng/ml do not have prostate cancer.  Other tests such as free PSA, age specific reference ranges, PSA velocity and PSA doubling time may be helpful especially in men less than 48 years old. Performed at [facility], [address] [place], [place] [address]     Radiology No results found.  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  No further significant pulmonary symptoms.  No further imaging or workup is planned at this point as we are treating him with full dose anticoagulation for at least 1 year and likely either full dose or prophylactic anticoagulation long-term.  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 At this point, we will continue full dose anticoagulation for at least 1 year and may be long-term.  Prophylactic anticoagulants could be considered at 1 year which would be reducing the dose of Xarelto .  We will plan on seeing him back in about 6 months to discuss options.  Compression and elevation for any postphlebitic symptoms.  Essential hypertension blood pressure control important in reducing the progression of atherosclerotic disease. On appropriate oral medications.    Selinda Gu, MD  09/19/2024 11:18 AM    This note was created with Dragon medical transcription system.  Any errors from dictation are purely  unintentional

## 2024-09-19 NOTE — Assessment & Plan Note (Signed)
 Status post pulmonary thrombectomy.  No further significant pulmonary symptoms.  No further imaging or workup is planned at this point as we are treating him with full dose anticoagulation for at least 1 year and likely either full dose or prophylactic anticoagulation long-term.

## 2024-09-19 NOTE — Assessment & Plan Note (Signed)
 blood pressure control important in reducing the progression of atherosclerotic disease. On appropriate oral medications.

## 2024-12-11 ENCOUNTER — Ambulatory Visit

## 2024-12-11 ENCOUNTER — Ambulatory Visit
Admission: EM | Admit: 2024-12-11 | Discharge: 2024-12-11 | Disposition: A | Discharge status: Home / Self Care | Attending: Emergency Medicine | Admitting: Emergency Medicine

## 2024-12-11 ENCOUNTER — Encounter: Payer: Self-pay | Admitting: Emergency Medicine

## 2024-12-11 DIAGNOSIS — R079 Chest pain, unspecified: Secondary | ICD-10-CM | POA: Diagnosis not present

## 2024-12-11 DIAGNOSIS — S29011A Strain of muscle and tendon of front wall of thorax, initial encounter: Secondary | ICD-10-CM

## 2024-12-11 MED ORDER — HYDROCODONE-ACETAMINOPHEN 5-325 MG PO TABS: 1.0000 | ORAL_TABLET | Freq: Four times a day (QID) | ORAL | 0 refills | Status: AC | PRN

## 2024-12-11 MED ORDER — BACLOFEN 10 MG PO TABS: 10.0000 mg | ORAL_TABLET | Freq: Three times a day (TID) | ORAL | 0 refills | Status: AC

## 2024-12-11 NOTE — ED Provider Notes (Signed)
 " [facility]    CSN: [id number] Arrival date & time: 12/11/24  0851      History   Chief Complaint Chief Complaint  Patient presents with   Chest Injury    HPI Benjamin Ford is a 64 y.o. male.   HPI  64 year old male with past medical history significant for anxiety, asthma, GERD, BPH, DVT and PE on Xarelto , and essential hypertension presents for evaluation of right sided chest pain.  This developed acutely last night when he was pulling a board.  He felt and heard a pop and has had pain since.  The pain increases with movement and deep respiration.  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) 06/21/2024   Essential hypertension 06/21/2024   Overweight (BMI 25.0-29.9) 06/21/2024   Acute deep vein thrombosis (DVT) of right lower extremity (HCC) 06/20/2024   Upper airway cough syndrome 11/10/2021   BPH with obstruction/lower urinary tract symptoms 07/23/2015   Family history of prostate cancer 07/23/2015   Breathlessness on exertion 07/19/2015   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 05/25/2014   Asthma with exacerbation 05/27/2010   Cannot sleep 12/06/2007   Acid reflux 07/26/2007   Awareness of heartbeats 04/04/2007   Anxiety disorder 01/03/2007    Past Surgical History:  Procedure Laterality Date   COLONOSCOPY WITH PROPOFOL  N/A 05/27/2020   Procedure: COLONOSCOPY WITH PROPOFOL ;  Surgeon: Therisa Bi, MD;  Location: [facility] ENDOSCOPY;  Service: Gastroenterology;  Laterality: N/A;   INGUINAL HERNIA REPAIR  2008   MASTECTOMY Bilateral 1992/1981   PULMONARY THROMBECTOMY N/A 06/21/2024   Procedure: PULMONARY THROMBECTOMY;  Surgeon:  Jama Cordella MATSU, MD;  Location: [facility] INVASIVE CV LAB;  Service: Cardiovascular;  Laterality: N/A;   VASECTOMY  1983       Home Medications    Prior to Admission medications  Medication Sig Start Date End Date Taking? Authorizing Provider  baclofen  (LIORESAL ) 10 MG tablet Take 1 tablet (10 mg total) by mouth 3 (three) times daily. 12/11/24  Yes Bernardino Ditch, NP  HYDROcodone -acetaminophen  (NORCO/VICODIN) 5-325 MG tablet Take 1-2 tablets by mouth every 6 (six) hours as needed. Maximum of 6 tablets in a 24-hour period. 12/11/24  Yes Bernardino Ditch, NP  levocetirizine (XYZAL  ALLERGY 24HR) 5 MG tablet Take 1 tablet (5 mg total) by mouth every evening. Home med. 06/27/24  Yes Awanda City, MD  rivaroxaban  (XARELTO ) 20 MG TABS tablet Take 1 tablet (20 mg total) by mouth daily with supper. 07/25/24  Yes Brown, Fallon E, NP  triamcinolone  cream (KENALOG ) 0.5 % Apply 1 Application topically 2 (two) times daily. 06/04/24  Yes [provider]    Family History Family History  Problem Relation Age of Onset   Prostate cancer Father    Cancer Father        bone, prostate   COPD Mother    Cancer Mother        lung   Cancer Maternal Grandmother        brain   Aneurysm Paternal Grandmother    Prostate cancer Brother    Prostate cancer Brother  Kidney disease Neg Hx    Bladder Cancer Neg Hx     Social History Social History[1]   Allergies   Apixaban  and Losartan   Review of Systems Review of Systems  Respiratory:  Positive for shortness of breath.   Cardiovascular:  Positive for chest pain.     Physical Exam Triage Vital Signs ED Triage Vitals  Encounter Vitals Group     BP      Girls Systolic BP Percentile      Girls Diastolic BP Percentile      Boys Systolic BP Percentile      Boys Diastolic BP Percentile      Pulse      Resp      Temp      Temp src      SpO2      Weight      Height      Head Circumference      Peak Flow      Pain Score      Pain Loc      Pain  Education      Exclude from Growth Chart    No data found.  Updated Vital Signs BP (!) 176/97 (BP Location: Left Arm)   Pulse 64   Temp 98 F (36.7 C) (Oral)   Resp 16   Wt 177 lb (80.3 kg)   SpO2 100%   BMI 28.57 kg/m   Visual Acuity Right Eye Distance:   Left Eye Distance:   Bilateral Distance:    Right Eye Near:   Left Eye Near:    Bilateral Near:     Physical Exam Vitals and nursing note reviewed.  Constitutional:      Appearance: Normal appearance. He is not ill-appearing.  HENT:     Head: Normocephalic and atraumatic.  Cardiovascular:     Rate and Rhythm: Normal rate and regular rhythm.     Pulses: Normal pulses.     Heart sounds: Normal heart sounds. No murmur heard.    No friction rub. No gallop.  Pulmonary:     Effort: Pulmonary effort is normal.     Breath sounds: Normal breath sounds. No wheezing, rhonchi or rales.     Comments: Patient is tender with palpation of the right anterior pectoral. Chest:     Chest wall: Tenderness present.  Skin:    General: Skin is warm and dry.     Capillary Refill: Capillary refill takes less than 2 seconds.     Findings: No bruising.  Neurological:     General: No focal deficit present.     Mental Status: He is alert and oriented to person, place, and time.      UC Treatments / Results  Labs (all labs ordered are listed, but only abnormal results are displayed) Labs Reviewed - No data to display  EKG Normal sinus rhythm with a ventricular rate of 64 bpm PR interval 134 ms QRS duration 86 ms QT/QTc 394/406 ms No ST or T wave abnormalities noted.  Radiology DG Chest 2 View Result Date: 12/11/2024 CLINICAL DATA:  Right chest pain EXAM: CHEST - 2 VIEW COMPARISON:  Chest radiograph dated 11/10/2021 FINDINGS: Normal lung volumes. No focal consolidations. No pleural effusion or pneumothorax. The heart size and mediastinal contours are within normal limits. No radiographic finding of acute displaced fracture.  IMPRESSION: No active cardiopulmonary disease. Electronically Signed   By: Limin  Xu M.D.   On: 12/11/2024 11:19    Procedures Procedures (including  critical care time)  Medications Ordered in UC Medications - No data to display  Initial Impression / Assessment and Plan / UC Course  I have reviewed the triage vital signs and the nursing notes.  Pertinent labs & imaging results that were available during my care of the patient were reviewed by me and considered in my medical decision making (see chart for details).   Patient is a pleasant 64 year old male who is in a moderate degree of pain presenting for evaluation of right sided anterior chest pain that began last night after he was exerting himself pulling a board.  He felt and heard a pop and he has had pain ever since.  The pain is dramatically increased with movement as well as deep inspiration.  He states he is unable to take a deep breath due to the pain.  He has been using Tylenol  for the pain with his last dose being at 0430 this morning.  His EKG shows normal sinus rhythm without any ST or T wave abnormalities.  He is tender with palpation of the right anterior chest wall with visible fasciculations of his right pectoral muscle.  I suspect that he has either strained or torn his pectoral muscle though I do not feel any muscle defect and there is no visible bruising at present.  His lungs are clear to auscultation all fields.  Due to the point tenderness that he is feeling with shortness of breath I will also obtain a chest x-ray to evaluate for any acute cardiopulmonary abnormality or rib injury.  Chest x-ray independently reviewed and evaluated by me.  Impression: Elevation of the right hemidiaphragm but no evidence of pneumothorax.  No evidence of osseous abnormality.  Cardiomediastinal silhouette appears normal.  Radiology overread is pending. Radiology impression states no active cardiopulmonary disease.  Normal lung volumes with no  pleural effusion no pneumothorax.  I will discharge patient home with a diagnosis of right pectoral strain.  I will have him continue to use Tylenol  as needed for mild to moderate pain and I will prescribe Norco that he can have for more severe pain.  He has no open narcotic scripts in PDMP.  Additionally, I will prescribe baclofen  that he can take every 8 hours to help with muscle pain and strain.  He may apply ice to the area for 20 minutes at a time, 2-3 times a day, to help with pain and inflammation for the first 48 hours and then transition to moist heat.  I have given home physical therapy exercises to do as well.  If his symptoms do not improve I will have him follow-up with orthopedics for further evaluation and dynamic imaging.   Final Clinical Impressions(s) / UC Diagnoses   Final diagnoses:  Chest pain, unspecified type  Strain of right pectoralis muscle, initial encounter     Discharge Instructions      Your chest x-ray did not show any evidence of a collapsed lung or rib injury.  I suspect, based upon your exam findings and method of injury, that you have strained your pectoralis muscle.  Rest your right pectoral is much as possible.  Continue to use over-the-counter Tylenol  as needed for mild to moderate pain and I have prescribed you some Norco that you can use for severe pain.  Be mindful that Norco also contains Tylenol  so make sure you are not taking more than 3000 mg of Tylenol  in total every 24 hours.  Do not drink alcohol, drive, or operate heavy  machinery or sign legal documents while you are taking the Norco.  You may apply ice to your chest wall for 20 minutes at a time, 2-3 times a day, to help with pain and inflammation.  Do this for the first 48 hours.  After 48 hours transition to moist heat 2-3 times a day.  The moist heat will improve blood flow to the muscle and aid in healing.  Follow the home physical therapy exercises that have included in your discharge  instructions.  If you do not have any improvement of your symptoms, or your pain worsens, I would recommend following up with orthopedics such as [facility] here in [place] or in [place].  If the pain does not improve you may need an MRI to look at the muscle tissue which we cannot order from this urgent care but they could order.  Both locations have walk-in orthopedic urgent cares as well.     ED Prescriptions     Medication Sig Dispense Auth. Provider   baclofen  (LIORESAL ) 10 MG tablet Take 1 tablet (10 mg total) by mouth 3 (three) times daily. 30 each Bernardino Ditch, NP   HYDROcodone -acetaminophen  (NORCO/VICODIN) 5-325 MG tablet Take 1-2 tablets by mouth every 6 (six) hours as needed. Maximum of 6 tablets in a 24-hour period. 10 tablet Bernardino Ditch, NP      I have reviewed the PDMP during this encounter.     [1]  Social History Tobacco Use   Smoking status: Former    Current packs/day: 0.00    Average packs/day: 1 pack/day for 14.0 years (14.0 ttl pk-yrs)    Types: Cigarettes    Start date: 36    Quit date: 1999    Years since quitting: 27.0   Smokeless tobacco: Never   Tobacco comments:    smoked <1  ppd off and on from mid 20s to mid 40s.   Vaping Use   Vaping status: Never Used  Substance Use Topics   Alcohol use: Yes    Alcohol/week: 0.0 standard drinks of alcohol    Comment: moderate   Drug use: No     Bernardino Ditch, NP 12/11/24 1129  "

## 2024-12-11 NOTE — ED Triage Notes (Addendum)
 Pt was working on his home last night and was pulling a board. He felt a pop and pain on the right side of his chest. It is difficult to take a full breath in. Pt has taken Tylenol  for the pain with no relief.

## 2024-12-11 NOTE — Discharge Instructions (Addendum)
 Your chest x-ray did not show any evidence of a collapsed lung or rib injury.  I suspect, based upon your exam findings and method of injury, that you have strained your pectoralis muscle.  Rest your right pectoral is much as possible.  Continue to use over-the-counter Tylenol  as needed for mild to moderate pain and I have prescribed you some Norco that you can use for severe pain.  Be mindful that Norco also contains Tylenol  so make sure you are not taking more than 3000 mg of Tylenol  in total every 24 hours.  Do not drink alcohol, drive, or operate heavy machinery or sign legal documents while you are taking the Norco.  You may apply ice to your chest wall for 20 minutes at a time, 2-3 times a day, to help with pain and inflammation.  Do this for the first 48 hours.  After 48 hours transition to moist heat 2-3 times a day.  The moist heat will improve blood flow to the muscle and aid in healing.  Follow the home physical therapy exercises that have included in your discharge instructions.  If you do not have any improvement of your symptoms, or your pain worsens, I would recommend following up with orthopedics such as [facility] here in [place] or in [place].  If the pain does not improve you may need an MRI to look at the muscle tissue which we cannot order from this urgent care but they could order.  Both locations have walk-in orthopedic urgent cares as well.

## 2025-01-25 ENCOUNTER — Other Ambulatory Visit

## 2025-02-09 ENCOUNTER — Ambulatory Visit: Admitting: Internal Medicine

## 2025-02-26 ENCOUNTER — Ambulatory Visit: Admitting: Urology

## 2025-03-20 ENCOUNTER — Ambulatory Visit (INDEPENDENT_AMBULATORY_CARE_PROVIDER_SITE_OTHER): Admitting: Nurse Practitioner
